# Patient Record
Sex: Male | Born: 1945
Health system: Southern US, Community
[De-identification: ages and names within clinical notes are randomized; demographics above are authoritative.]

## PROBLEM LIST (undated history)

## (undated) DIAGNOSIS — E782 Mixed hyperlipidemia: Secondary | ICD-10-CM

## (undated) DIAGNOSIS — I483 Typical atrial flutter: Secondary | ICD-10-CM

## (undated) DIAGNOSIS — K4090 Unilateral inguinal hernia, without obstruction or gangrene, not specified as recurrent: Secondary | ICD-10-CM

## (undated) DIAGNOSIS — J309 Allergic rhinitis, unspecified: Secondary | ICD-10-CM

## (undated) DIAGNOSIS — I499 Cardiac arrhythmia, unspecified: Secondary | ICD-10-CM

## (undated) DIAGNOSIS — K219 Gastro-esophageal reflux disease without esophagitis: Secondary | ICD-10-CM

## (undated) DIAGNOSIS — G4700 Insomnia, unspecified: Secondary | ICD-10-CM

## (undated) DIAGNOSIS — R030 Elevated blood-pressure reading, without diagnosis of hypertension: Secondary | ICD-10-CM

## (undated) DIAGNOSIS — I10 Essential (primary) hypertension: Secondary | ICD-10-CM

## (undated) DIAGNOSIS — I493 Ventricular premature depolarization: Secondary | ICD-10-CM

## (undated) DIAGNOSIS — R0789 Other chest pain: Secondary | ICD-10-CM

## (undated) DIAGNOSIS — E7800 Pure hypercholesterolemia, unspecified: Secondary | ICD-10-CM

## (undated) HISTORY — DX: Elevated blood-pressure reading, without diagnosis of hypertension: R03.0

## (undated) HISTORY — DX: Allergic rhinitis, unspecified: J30.9

## (undated) HISTORY — DX: Mixed hyperlipidemia: E78.2

## (undated) HISTORY — DX: Insomnia, unspecified: G47.00

## (undated) HISTORY — DX: Other chest pain: R07.89

## (undated) HISTORY — DX: Ventricular premature depolarization: I49.3

---
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
## 1992-01-23 HISTORY — PX: INGUINAL HERNIA REPAIR: SUR1180

## 1992-01-23 HISTORY — PX: ASD REPAIR: SHX258

## 1999-07-17 ENCOUNTER — Encounter: Payer: Self-pay | Admitting: General Surgery

## 1999-07-19 ENCOUNTER — Ambulatory Visit (HOSPITAL_COMMUNITY): Admission: RE | Admit: 1999-07-19 | Discharge: 1999-07-19 | Payer: Self-pay | Admitting: General Surgery

## 2002-08-10 ENCOUNTER — Encounter: Payer: Self-pay | Admitting: Emergency Medicine

## 2002-08-10 ENCOUNTER — Emergency Department (HOSPITAL_COMMUNITY): Admission: EM | Admit: 2002-08-10 | Discharge: 2002-08-10 | Payer: Self-pay | Admitting: Emergency Medicine

## 2004-02-18 ENCOUNTER — Ambulatory Visit (HOSPITAL_COMMUNITY): Admission: RE | Admit: 2004-02-18 | Discharge: 2004-02-18 | Payer: Self-pay | Admitting: Gastroenterology

## 2014-06-05 DIAGNOSIS — J439 Emphysema, unspecified: Secondary | ICD-10-CM | POA: Diagnosis not present

## 2014-06-05 DIAGNOSIS — R05 Cough: Secondary | ICD-10-CM | POA: Diagnosis not present

## 2014-07-27 DIAGNOSIS — Z1211 Encounter for screening for malignant neoplasm of colon: Secondary | ICD-10-CM | POA: Diagnosis not present

## 2014-08-10 DIAGNOSIS — E559 Vitamin D deficiency, unspecified: Secondary | ICD-10-CM | POA: Diagnosis not present

## 2014-08-10 DIAGNOSIS — R351 Nocturia: Secondary | ICD-10-CM | POA: Diagnosis not present

## 2014-08-10 DIAGNOSIS — N529 Male erectile dysfunction, unspecified: Secondary | ICD-10-CM | POA: Diagnosis not present

## 2014-08-10 DIAGNOSIS — R5383 Other fatigue: Secondary | ICD-10-CM | POA: Diagnosis not present

## 2014-08-10 DIAGNOSIS — G4700 Insomnia, unspecified: Secondary | ICD-10-CM | POA: Diagnosis not present

## 2014-09-21 DIAGNOSIS — G4700 Insomnia, unspecified: Secondary | ICD-10-CM | POA: Diagnosis not present

## 2014-09-21 DIAGNOSIS — E780 Pure hypercholesterolemia: Secondary | ICD-10-CM | POA: Diagnosis not present

## 2014-09-21 DIAGNOSIS — Z1389 Encounter for screening for other disorder: Secondary | ICD-10-CM | POA: Diagnosis not present

## 2014-09-21 DIAGNOSIS — R7309 Other abnormal glucose: Secondary | ICD-10-CM | POA: Diagnosis not present

## 2014-09-21 DIAGNOSIS — E291 Testicular hypofunction: Secondary | ICD-10-CM | POA: Diagnosis not present

## 2014-09-21 DIAGNOSIS — Z0000 Encounter for general adult medical examination without abnormal findings: Secondary | ICD-10-CM | POA: Diagnosis not present

## 2014-09-21 DIAGNOSIS — Z1212 Encounter for screening for malignant neoplasm of rectum: Secondary | ICD-10-CM | POA: Diagnosis not present

## 2014-09-29 DIAGNOSIS — Z1211 Encounter for screening for malignant neoplasm of colon: Secondary | ICD-10-CM | POA: Diagnosis not present

## 2014-09-29 DIAGNOSIS — K5730 Diverticulosis of large intestine without perforation or abscess without bleeding: Secondary | ICD-10-CM | POA: Diagnosis not present

## 2014-12-09 DIAGNOSIS — G4700 Insomnia, unspecified: Secondary | ICD-10-CM | POA: Diagnosis not present

## 2014-12-09 DIAGNOSIS — G2581 Restless legs syndrome: Secondary | ICD-10-CM | POA: Diagnosis not present

## 2014-12-09 DIAGNOSIS — R0982 Postnasal drip: Secondary | ICD-10-CM | POA: Diagnosis not present

## 2014-12-30 ENCOUNTER — Encounter: Payer: Self-pay | Admitting: Neurology

## 2014-12-30 ENCOUNTER — Ambulatory Visit (INDEPENDENT_AMBULATORY_CARE_PROVIDER_SITE_OTHER): Payer: Medicare Other | Admitting: Neurology

## 2014-12-30 VITALS — BP 130/88 | HR 82 | Resp 20 | Ht 70.5 in | Wt 171.0 lb

## 2014-12-30 DIAGNOSIS — R292 Abnormal reflex: Secondary | ICD-10-CM

## 2014-12-30 DIAGNOSIS — D508 Other iron deficiency anemias: Secondary | ICD-10-CM

## 2014-12-30 DIAGNOSIS — G4701 Insomnia due to medical condition: Secondary | ICD-10-CM | POA: Diagnosis not present

## 2014-12-30 DIAGNOSIS — G2581 Restless legs syndrome: Secondary | ICD-10-CM | POA: Diagnosis not present

## 2014-12-30 NOTE — Progress Notes (Signed)
SLEEP MEDICINE CLINIC   Provider:  Melvyn Novas, M D  Referring Provider: Dorothyann Peng, MD Primary Care Physician:  [name], MD  Chief Complaint  Patient presents with  . New Patient (Initial Visit)    legs feel the urge to move, doesn't sleep well at night, rm 11, alone    HPI: Carlos [name] is a 69 y.o. 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  He is taking Ambien CR for a while now he breaks the pill in half usually it makes him sleep but does not seem to have a hangover effect or daytime drowsiness effect. He attributes some of his difficulties to restless leg syndrome. He has never been evaluated in a sleep study if there could be other physiologic reasons for him to not have sound and restorative sleep without medication. He reports that his insomnia problem started about 15 years ago, and he has been taking the Ambien for 7 years. He does not recall if his insomnia was related to a medical issue that seemed to have him be no special stressors at the time.  He has undergone a ASD repair in 1994 and had a hernia repair in 1962. These surgeries did not contribute to insomnia. The supposed restless legs are actually evident as the meet here the patient has trouble to sit still. He has some discomfort sometimes in the back of his calf and he states that he keeps moving, mashing and massaging - described as an irresistible urge to move.   I was able to have Carlos [name] answer  the [facility] restless leg question here  which is related to the quality of life  . He states that he does not feel any impairment in daytime and that the severity is mild. He endorsed the Epworth sleepiness score at one point and the fatigue severity score at 12 points, the geriatric depression score at one point.   Sleep habits are as follows:  The  patient usually watches TV and then gets ready for bed at about 10:30. He takes the Ambien as he prepares in the bathroom and usually will be asleep within the next 30 minutes. He sleeps about 5 hours after the Ambien is working and wakes up between 4:30 and 5 AM. Craves to sleep longer but for some reason at that time is awake. He feels almost restored and refreshed enough to go for the day. He just stays in bed but doesn't leave yet. He will rise at about 6:30 so over an hour after he actually spontaneously woke. He does not have bathroom breaks interrupting his sleep. Carlos [name] is widowed but his late wife never told him that he snored or had apnea. He wakes up without any headaches and normally does not have a dry mouth. He does recall dreaming, but he never had any evidence of sleep eating sleepwalking or an enactment of dreams. In the afternoon at around 2:58 PM he will usually try to rest for an hour but he rarely will fall asleep. This is more for relaxation time.   He is vegetarian, but eats fish. Lots of greens.  Sleep medical history and family sleep history: mother had leg cramps.    Social history: ETOH, 2-3 glasses of red wine.  Nicotine: none ,  caffeine - chai  tea in AM and sometimes in PM.   Review of Systems: Out of a complete 14 system review, the patient complains of only the following symptoms, and all other reviewed systems are negative. The patient thinks that his restless legs arose over the last 7 or 8 years pretty much the same time he has tried to go to sleep with Ambien. His insomnia has been chronic and precedes the intake of Ambien present for at least 15 years.   Social History   Social History  . Marital Status: Divorced    Spouse Name: N/A  . Number of Children: N/A  . Years of Education: N/A   Occupational History  . Not on file.   Social History Main Topics  . Smoking status: Former Smoker -- 9 years    Quit date: 01/22/2006  . Smokeless tobacco: Not  on file  . Alcohol Use: 12.6 oz/week    21 Standard drinks or equivalent per week     Comment: 2-3 glasses per day  . Drug Use: No  . Sexual Activity: Not on file   Other Topics Concern  . Not on file   Social History Narrative   Drinks 1-2 cups of caffeine daily.    History reviewed. No pertinent family history.  Past Medical History  Diagnosis Date  . Mixed hyperlipidemia   . Insomnia   . Allergic rhinitis     Past Surgical History  Procedure Laterality Date  . Inguinal hernia repair  1994  . Asd repair  1994    Current Outpatient Prescriptions  Medication Sig Dispense Refill  . aspirin 81 MG tablet Take 81 mg by mouth daily.    Marland Kitchen CALCIUM PO Take by mouth.    . Cholecalciferol (VITAMIN D PO) Take by mouth.    . Multiple Vitamin (MULTIVITAMIN) capsule Take 1 capsule by mouth daily.    . Omega-3 Fatty Acids (FISH OIL PO) Take by mouth.    . zolpidem (AMBIEN CR) 12.5 MG CR tablet Take 12.5 mg by mouth at bedtime as needed for sleep.     No current facility-administered medications for this visit.    Allergies as of 12/30/2014  . (No Known Allergies)    Vitals: BP 130/88 mmHg  Pulse 82  Resp 20  Ht 5' 10.5" (1.791 m)  Wt 171 lb (77.565 kg)  BMI 24.18 kg/m2 Last Weight:  Wt Readings from Last 1 Encounters:  12/30/14 171 lb (77.565 kg)   ZOX:WRUE mass index is 24.18 kg/(m^2).     Last Height:   Ht Readings from Last 1 Encounters:  12/30/14 5' 10.5" (1.791 m)    Physical exam:  General: The patient is awake, alert and appears not in acute distress. The patient is well groomed. Head: Normocephalic, atraumatic. Neck is supple. Mallampati 1,  neck circumference: 16.5 . Nasal airflow  Unrestricted , TMJ click  evident . Retrognathia  is seen.  Cardiovascular:  Regular rate and rhythm, without  murmurs or carotid bruit, and without distended neck veins. Respiratory: Lungs are clear to auscultation. Skin:  Without evidence of edema, or rash Trunk: The  patient's posture is erect  Neurologic exam : The patient is awake and alert, oriented to place and time.   Memory subjective  described as intact.  Attention span & concentration ability appears normal.  Speech is fluent,  without  dysarthria, dysphonia or aphasia.  Mood and affect are appropriate.  Cranial nerves: Pupils are equal and briskly reactive to light. Funduscopic exam without  evidence of pallor or edema.  Extraocular movements  in vertical and horizontal planes intact and without nystagmus. Visual fields by finger perimetry are intact. Hearing to finger rub intact.   Facial sensation intact to fine touch.  Facial motor strength is symmetric and tongue and uvula move midline. Shoulder shrug was symmetrical.   Motor exam: Normal tone, muscle bulk and symmetric strength in all extremities.  Sensory:  Fine touch, pinprick and vibration were tested in all extremities. Proprioception tested in the upper extremities was normal.  Coordination: Rapid alternating movements in the fingers/hands was normal.  Finger-to-nose maneuver  normal without evidence of ataxia, dysmetria or tremor.  Gait and station: Patient walks without assistive device and is able unassisted to climb up to the exam table. Strength within normal limits.  Stance is stable and normal. Turns with  3 Steps. Romberg testing is  negative.  Deep tendon reflexes: in the  upper and lower extremities are present, the lower extremity reflexes are very brisk but there is no clonus. Especially the patella reflex is brisk symmetric and intact. Babinski maneuver response is downgoing.  The patient was advised of the nature of the diagnosed sleep disorder , the treatment options and risks for general a health and wellness arising from not treating the condition.  I spent more than 40 minutes of face to face time with the patient. Greater than 50% of time was spent in counseling and coordination of care. We have discussed the  diagnosis and differential and I answered the patient's questions.     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 Due to the finding of very brisk lower extremity deep tendon reflexes I suspect that he may have a spinal stenosis, his upper extremity reflexes are at a much lower level. He does not have any muscle atrophy or asymmetry of tone.  2) in addition to an imaging study of the lower spine I would like to obtain a ferritin level, total iron binding capacity, free iron level. This is to rule out an iron deficiency promoted restless leg syndrome.  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   Plan:  Treatment plan and additional workup :  Lab tests and MRI thoracic/ lower spine .      Carlos Mylararmen Sekai Nayak MD  12/30/2014   CC: Dorothyann Pengobyn Sanders, Md [address] [place], [place] [id number]

## 2014-12-30 NOTE — Addendum Note (Signed)
Addended by: Melvyn NovasHMEIER, Carley Strickling on: 12/30/2014 02:59 PM   Modules accepted: Orders

## 2014-12-31 LAB — COMPREHENSIVE METABOLIC PANEL
ALK PHOS: 86 IU/L (ref 39–117)
ALT: 24 IU/L (ref 0–44)
AST: 13 IU/L (ref 0–40)
Albumin/Globulin Ratio: 1.8 (ref 1.1–2.5)
Albumin: 4.6 g/dL (ref 3.6–4.8)
BILIRUBIN TOTAL: 0.6 mg/dL (ref 0.0–1.2)
BUN/Creatinine Ratio: 17 (ref 10–22)
BUN: 15 mg/dL (ref 8–27)
CHLORIDE: 96 mmol/L — AB (ref 97–106)
CO2: 26 mmol/L (ref 18–29)
Calcium: 10.6 mg/dL — ABNORMAL HIGH (ref 8.6–10.2)
Creatinine, Ser: 0.88 mg/dL (ref 0.76–1.27)
GFR calc non Af Amer: 88 mL/min/{1.73_m2} (ref >59)
GFR, EST AFRICAN AMERICAN: 101 mL/min/{1.73_m2} (ref >59)
GLUCOSE: 93 mg/dL (ref 65–99)
Globulin, Total: 2.6 g/dL (ref 1.5–4.5)
Potassium: 5.2 mmol/L (ref 3.5–5.2)
Sodium: 137 mmol/L (ref 136–144)
TOTAL PROTEIN: 7.2 g/dL (ref 6.0–8.5)

## 2014-12-31 LAB — FERRITIN: FERRITIN: 425 ng/mL — AB (ref 30–400)

## 2015-01-03 ENCOUNTER — Telehealth: Payer: Self-pay

## 2015-01-03 NOTE — Telephone Encounter (Signed)
Spoke to pt and advised him that his ferritin is high, which could be a sign of an inflammatory process. I advised him that his calcium was slightly elevated which may be related to calcium supplement intake. I advised him that his renal function and liver function was normal. Pt verbalized understanding. Pt says he will discuss these results further with Dr. Vickey Hugerohmeier on 02/09/15.

## 2015-01-03 NOTE — Telephone Encounter (Signed)
Called pt to discuss results. No answer, left a message asking him to call me back.

## 2015-01-03 NOTE — Telephone Encounter (Signed)
-----   Message from Melvyn Novasarmen Dohmeier, MD sent at 12/31/2014  1:25 PM EST ----- Dr. Clelia CroftShaw has an unusual high ferritin level, which can be a sign of an inflammatory process. His calcium was slightly elevated which may be related to calcium supplement intake? Renal function and liver function and normal limits. CD

## 2015-01-03 NOTE — Telephone Encounter (Signed)
Pt returned call

## 2015-01-12 ENCOUNTER — Ambulatory Visit
Admission: RE | Admit: 2015-01-12 | Discharge: 2015-01-12 | Disposition: A | Discharge status: Home / Self Care | Payer: Medicare Other | Source: Ambulatory Visit | Attending: Neurology | Admitting: Neurology

## 2015-01-12 DIAGNOSIS — G2581 Restless legs syndrome: Secondary | ICD-10-CM

## 2015-01-12 DIAGNOSIS — R292 Abnormal reflex: Secondary | ICD-10-CM

## 2015-01-12 DIAGNOSIS — G4701 Insomnia due to medical condition: Secondary | ICD-10-CM | POA: Diagnosis not present

## 2015-01-12 MED ORDER — GADOBENATE DIMEGLUMINE 529 MG/ML IV SOLN
15.0000 mL | Freq: Once | INTRAVENOUS | Status: AC | PRN
  Administered 2015-01-12: 15 mL via INTRAVENOUS

## 2015-01-19 ENCOUNTER — Telehealth: Payer: Self-pay

## 2015-01-19 NOTE — Telephone Encounter (Signed)
Spoke pt pt and advised him that his MRI results showed a degeneration of his spine as well as a beginning dilation of his thoracic aorta. The degeneration of his spinal column could be an explanation for his hyperreflexia and RLS but does not show any nerve fiber impingement. Pt wants to discuss these results at his appt on 1/18. Pt verbalized understanding.

## 2015-01-19 NOTE — Telephone Encounter (Signed)
I advised pt that his MRI results showed a borderline enlarged thoracic aorta that should be followed every 2 years, and mild disc bulging. I advised him that nerve studies should be discussed at his next OV. Pt verbalized understanding.

## 2015-01-19 NOTE — Telephone Encounter (Signed)
-----   Message from Melvyn Novasarmen Dohmeier, MD sent at 01/18/2015  4:48 PM EST ----- Borderline enlarged thoracic aorta that should be followed up every 2 years. Mild disc bulging.  I would like EMG and nerve conduction studies to follow.Can be discussed in Rv with NP.

## 2015-01-19 NOTE — Telephone Encounter (Signed)
-----   Message from Melvyn Novasarmen Dohmeier, MD sent at 01/18/2015  4:46 PM EST ----- Dr.penumalli reported a degeneration of the spine as well as a beginning dilation of the aorta abdominalis. The degeneration of the spinal column could be an explanation for his hyperreflexia and restless legs but it does not report that nerve fibers are impinged upon.

## 2015-02-09 ENCOUNTER — Ambulatory Visit (INDEPENDENT_AMBULATORY_CARE_PROVIDER_SITE_OTHER): Payer: Medicare Other | Admitting: Neurology

## 2015-02-09 ENCOUNTER — Encounter: Payer: Self-pay | Admitting: Neurology

## 2015-02-09 VITALS — BP 130/90 | HR 66 | Resp 20 | Ht 70.5 in | Wt 165.0 lb

## 2015-02-09 DIAGNOSIS — G2581 Restless legs syndrome: Secondary | ICD-10-CM | POA: Diagnosis not present

## 2015-02-09 DIAGNOSIS — G4761 Periodic limb movement disorder: Secondary | ICD-10-CM | POA: Diagnosis not present

## 2015-02-09 DIAGNOSIS — R292 Abnormal reflex: Secondary | ICD-10-CM | POA: Diagnosis not present

## 2015-02-09 MED ORDER — ZOLPIDEM TARTRATE ER 12.5 MG PO TBCR
12.5000 mg | EXTENDED_RELEASE_TABLET | Freq: Every evening | ORAL | Status: DC | PRN
Start: 2015-02-09 — End: 2015-08-17

## 2015-02-09 NOTE — Progress Notes (Signed)
SLEEP MEDICINE CLINIC   Provider:  Melvyn Novas, M D  Referring Provider: Dorothyann Peng, MD Primary Care Physician:  [name], MD  Chief Complaint  Patient presents with  . Follow-up    MRI results, insomnia, rm 11, alone    HPI: Dr.  Orland Jarred is a 70 y.o. male , seen here as a referral from Dr. 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  He is taking Ambien CR for a while now he breaks the pill in half usually it makes him sleep but does not seem to have a hangover effect or daytime drowsiness effect. He attributes some of his difficulties to restless leg syndrome. He has never been evaluated in a sleep study if there could be other physiologic reasons for him to not have sound and restorative sleep without medication. He reports that his insomnia problem started about 15 years ago, and he has been taking the Ambien for 7 years. He does not recall if his insomnia was related to a medical issue that seemed to have him be no special stressors at the time.  He has undergone a ASD repair in 1994 and had a hernia repair in 1962. These surgeries did not contribute to insomnia. The supposed restless legs are actually evident as the meet here the patient has trouble to sit still. He has some discomfort sometimes in the back of his calf and he states that he keeps moving, mashing and massaging - described as an irresistible urge to move.  I was able to have Dr. Sherryll Burger answer  the [facility] restless leg question here  which is related to the quality of life . He states that he does not feel any impairment in daytime and that the severity is mild. He endorsed the Epworth sleepiness score at one point and the fatigue severity score at 12 points, the geriatric depression score at one point.  Sleep habits are as follows:The patient usually watches TV and then gets ready for bed at about  10:30. He takes the Ambien as he prepares in the bathroom and usually will be asleep within the next 30 minutes. He sleeps about 5 hours after the Ambien is working and wakes up between 4:30 and 5 AM. Craves to sleep longer but for some reason at that time is awake. He feels almost restored and refreshed enough to go for the day. He just stays in bed but doesn't leave yet. He will rise at about 6:30 so over an hour after he actually spontaneously woke. He does not have bathroom breaks interrupting his sleep. Dr. Clelia Croft is widowed but his late wife never told him that he snored or had apnea. He wakes up without any headaches and normally does not have a dry mouth. He does recall dreaming, but he never had any evidence of sleep eating sleepwalking or an enactment of dreams. In the afternoon at around 2:58 PM he will usually try to rest for an hour but he rarely will fall asleep. This is more for relaxation time. He is vegetarian, but eats fish. Lots of greens.  Sleep medical history and family sleep history: mother had leg cramps.  Social history: ETOH, 2-3 glasses of red wine.  Nicotine: none ,  caffeine - chai tea in AM and sometimes in PM.   Interval history from 02/09/2015,  Mr. Littler is here today for a  revisit. I had noted in his physical exam that he has rather brisk deep tendon reflexes and for this reason evaluated him with a imaging study. His thoracic spine MRI showed mild disc bulging and spondylosis which would be allowed for his age. There is no spinal stenosis and no foraminal narrowing noted. Enhancing lesions within the spinal cord but incidentally of borderline and large thoracic aorta was found be consider for his height and normal thoracic aorta lumen to be 3 cm and his of 3.6. My colleage ,Dr. Marjory Lies , recommended monitoring at interval of 2 years- I will send this note to Dr Allyne Gee.  We also obtained a metabolic panel showed a low very borderline chloride level and an elevated calcium  level.  The calcium level is is explained by vitamin D intake.  He did have a high ferritin level which is usually a marker for inflammation. A low ferritin level would be a marker for iron deficiency and could explain restless legs. Again in his case is did not show up.  Review of Systems: Out of a complete 14 system review, the patient complains of only the following symptoms, and all other reviewed systems are negative. The patient thinks that his restless legs arose over the last 7 or 8 years pretty much the same time he has tried to go to sleep with Ambien. His insomnia has been chronic and precedes the intake of Ambien present for at least 15 years.   Social History   Social History  . Marital Status: Divorced    Spouse Name: N/A  . Number of Children: N/A  . Years of Education: N/A   Occupational History  . Not on file.   Social History Main Topics  . Smoking status: Former Smoker -- 9 years    Quit date: 01/22/2006  . Smokeless tobacco: Not on file  . Alcohol Use: 12.6 oz/week    21 Standard drinks or equivalent per week     Comment: 2-3 glasses per day  . Drug Use: No  . Sexual Activity: Not on file   Other Topics Concern  . Not on file   Social History Narrative   Drinks 1-2 cups of caffeine daily.    History reviewed. No pertinent family history.  Past Medical History  Diagnosis Date  . Mixed hyperlipidemia   . Insomnia   . Allergic rhinitis     Past Surgical History  Procedure Laterality Date  . Inguinal hernia repair  1994  . Asd repair  1994    Current Outpatient Prescriptions  Medication Sig Dispense Refill  . aspirin 81 MG tablet Take 81 mg by mouth daily.    Marland Kitchen CALCIUM PO Take by mouth.    . Cholecalciferol (VITAMIN D PO) Take by mouth.    . Multiple Vitamin (MULTIVITAMIN) capsule Take 1 capsule by mouth daily.    . Omega-3 Fatty Acids (FISH OIL PO) Take by mouth.    . zolpidem (AMBIEN CR) 12.5 MG CR tablet Take 12.5 mg by mouth at bedtime  as needed for sleep.     No current facility-administered medications for this visit.    Allergies as of 02/09/2015  . (No Known Allergies)    Vitals: BP 130/90 mmHg  Pulse 66  Resp 20  Ht 5' 10.5" (1.791 m)  Wt 165 lb (74.844 kg)  BMI 23.33 kg/m2 Last Weight:  Wt Readings from Last 1 Encounters:  02/09/15 165 lb (74.844 kg)   VWU:JWJX mass index is 23.33 kg/(m^2).  Last Height:   Ht Readings from Last 1 Encounters:  02/09/15 5' 10.5" (1.791 m)    Physical exam:  General: The patient is awake, alert and appears not in acute distress. The patient is well groomed. Head: Normocephalic, atraumatic. Neck is supple. Mallampati 1,  neck circumference: 16.5 . Nasal airflow  Unrestricted , TMJ click  evident . Retrognathia  is seen.  Cardiovascular:  Regular rate and rhythm, without  murmurs or carotid bruit, and without distended neck veins. Respiratory: Lungs are clear to auscultation. Skin:  Without evidence of edema, or rash Trunk: The patient's posture is erect  Neurologic exam : The patient is awake and alert, oriented to place and time.   Memory subjective  described as intact.  Attention span & concentration ability appears normal.  Speech is fluent,  without  dysarthria, dysphonia or aphasia.  Mood and affect are appropriate.  Cranial nerves: Pupils are equal and briskly reactive to light. Funduscopic exam without  evidence of pallor or edema.  Extraocular movements  in vertical and horizontal planes intact and without nystagmus. Visual fields by finger perimetry are intact. Hearing to finger rub intact.   Facial sensation intact to fine touch.  Facial motor strength is symmetric and tongue and uvula move midline. Shoulder shrug was symmetrical.   Motor exam: Normal tone, muscle bulk and symmetric strength in all extremities.  Deep tendon reflexes: in the  upper and lower extremities are present, the lower extremity reflexes are very brisk but there is no clonus.  Especially the patella reflex is brisk symmetric and intact. Babinski maneuver response is downgoing.  The patient was advised of the nature of the diagnosed sleep disorder , the treatment options and risks for general a health and wellness arising from not treating the condition.  I spent more than 20 minutes of face to face time with the patient. Greater than 50% of time was spent in counseling and coordination of care.   We have discussed the diagnosis and differential and I answered the patient's questions. I will offer him Ambien refill.    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  Due to the finding of very brisk lower extremity deep tendon reflexes I suspect that he may have a spinal stenosis, his upper extremity reflexes are at a much lower level. He does not have any muscle atrophy or asymmetry of tone.  Due to the benign findings of his imaging study and the comprehensive metabolic panel and ferritin level I will refill the patient's Ambien,   I will also ask his primary care physician Dr. Lelon Perla to consider checking on the enlarged thoracic aorta every 2 years.   Plan:  Treatment plan and additional workup :   Melvyn Novas MD  02/09/2015   CC: Dorothyann Peng, Md [address] [place] [address] [place], [place] [address]

## 2015-02-09 NOTE — Patient Instructions (Signed)
Restless Legs Syndrome Restless legs syndrome is a condition that causes uncomfortable feelings or sensations in the legs, especially while sitting or lying down. The sensations usually cause an overwhelming urge to move the legs. The arms can also sometimes be affected. The condition can range from mild to severe. The symptoms often interfere with a person's ability to sleep. CAUSES The cause of this condition is not known. RISK FACTORS This condition is more likely to develop in:  People who are older than age 50.  Pregnant women. In general, restless legs syndrome is more common in women than in men.  People who have a family history of the condition.  People who have certain medical conditions, such as iron deficiency, kidney disease, Parkinson disease, or nerve damage.  People who take certain medicines, such as medicines for high blood pressure, nausea, colds, allergies, depression, and some heart conditions. SYMPTOMS The main symptom of this condition is uncomfortable sensations in the legs. These sensations may be:  Described as pulling, tingling, prickling, throbbing, crawling, or burning.  Worse while you are sitting or lying down.  Worse during periods of rest or inactivity.  Worse at night, often interfering with your sleep.  Accompanied by a very strong urge to move your legs.  Temporarily relieved by movement of your legs. The sensations usually affect both sides of the body. The arms can also be affected, but this is rare. People who have this condition often have tiredness during the day because of their lack of sleep at night. DIAGNOSIS This condition may be diagnosed based on your description of the symptoms. You may also have tests, including blood tests, to check for other conditions that may lead to your symptoms. In some cases, you may be asked to spend some time in a sleep lab so your sleeping can be monitored. TREATMENT Treatment for this condition is  focused on managing the symptoms. Treatment may include:  Self-help and lifestyle changes.  Medicines. HOME CARE INSTRUCTIONS  Take medicines only as directed by your health care provider.  Try these methods to get temporary relief from the uncomfortable sensations:  Massage your legs.  Walk or stretch.  Take a cold or hot bath.  Practice good sleep habits. For example, go to bed and get up at the same time every day.  Exercise regularly.  Practice ways of relaxing, such as yoga or meditation.  Avoid caffeine and alcohol.  Do not use any tobacco products, including cigarettes, chewing tobacco, or electronic cigarettes. If you need help quitting, ask your health care provider.  Keep all follow-up visits as directed by your health care provider. This is important. SEEK MEDICAL CARE IF: Your symptoms do not improve with treatment, or they get worse.   This information is not intended to replace advice given to you by your health care provider. Make sure you discuss any questions you have with your health care provider.   Document Released: 12/29/2001 Document Revised: 05/25/2014 Document Reviewed: 01/04/2014 Elsevier Interactive Patient Education 2016 Elsevier Inc.  

## 2015-02-21 ENCOUNTER — Telehealth: Payer: Self-pay

## 2015-02-21 NOTE — Telephone Encounter (Signed)
Pt came by office requesting a copy of his MRI thoracic results. Pt signed a release of medical records allowing him to pick up a copy of his MRI thoracic. Imaging report printed. I advised pt that if he wanted a CD with the images, he must go to [facility] and request it. Pt verbalized understanding.

## 2015-02-22 ENCOUNTER — Telehealth: Payer: Self-pay

## 2015-02-22 NOTE — Telephone Encounter (Signed)
I will obtain the guidelines for t abdominal and thoracic aortic enlargement before calling him. CD

## 2015-02-22 NOTE — Telephone Encounter (Signed)
Pt came by the office again today. His nephew is a physician in [place] and is concerned about the incidental finding of the enlarged thoracic aorta from the MRI thoracic spine. Pt is asking about dimensions of the enlargement and exact location. He received a CD of the images from [facility], but was told by that office that Dr. Marjory Lies read the report and he would be the one to know th exact dimensions and locations of the enlargement.  Pt already has a copy of the MRI report and CD of the images, but he is requesting that Dr. Vickey Huger give him a call to discuss his  concerns. I advised pt that I would send this message to her and she would call him at her convenience. Pt's phone number is [phone].

## 2015-02-23 NOTE — Telephone Encounter (Signed)
I left a voice message today from [name], was very concerned about his thoracic aortic dilation to 3.6 cm. Surgical options are usually not entertained under 5 cm. He is definitely not in danger of needing a surgical evaluation but vascular surgery guidelines recommend an ultrasound yearly to measure the aortic lumen and initiate surgery if necessary once the name threshold has passed.Recommendationis an US of the abdomen in the next 6 month and from there on in regular intervals to monitor.     I have forwarded this note to Dr Allyne Gee.   Melvyn Novas, MD

## 2015-02-23 NOTE — Telephone Encounter (Signed)
Patient is calling back to discuss MRI results.

## 2015-02-24 ENCOUNTER — Telehealth: Payer: Self-pay | Admitting: *Deleted

## 2015-02-24 NOTE — Telephone Encounter (Signed)
I left a long and detailed message on voicemail for Carlos [name], he will have to discuss the results further with his primary care physician or takes a disc to his nephew, I understand will be reviewing the study with him.CD

## 2015-02-24 NOTE — Telephone Encounter (Signed)
Called pt to discuss. Dr. Vickey Huger left him a long and detailed message yesterday and she recommends discussing the results further with his PCP, and to let his nephew look at the CD if he is further concerned. No answer, left a message asking him to call us back.

## 2015-02-24 NOTE — Telephone Encounter (Signed)
Pt called back and states the message left was hard for him to understand. He is requesting another call back . He feels that it needs to be in " common man's language not medical."

## 2015-02-24 NOTE — Telephone Encounter (Signed)
I spoke to Carlos [name]. I explained to him that he needed to discuss this further with Dr. Allyne Gee, his PCP. The enlarged aorta was an incidental finding on his MRI, and since it is not neurological, our office would recommend perhaps a cardiology evaluation, but that is up to the Carlos [name] and Dr. Allyne Gee to decide. I explained to him that since the radiologist was taking pictures of his thoracic spine, and the aorta was not a focus of their pictures, it would be hard to pinpoint the exact location of the enlargement. Further testing may be needed to determine that. Carlos [name] has picked up the images from [facility]. I encouraged him to show the images to his PCP and his nephew physician if he has further questions or concerns. Carlos [name] verbalized understanding. Carlos [name] thanked me for my time and effort.

## 2015-02-24 NOTE — Telephone Encounter (Signed)
Pt came by the office for the third time this week. He said that he still hasn't received a call from Dr. Vickey Huger to discuss MRI results.

## 2015-03-02 ENCOUNTER — Telehealth: Payer: Self-pay | Admitting: Diagnostic Neuroimaging

## 2015-03-02 DIAGNOSIS — J309 Allergic rhinitis, unspecified: Secondary | ICD-10-CM | POA: Diagnosis not present

## 2015-03-02 DIAGNOSIS — Z79899 Other long term (current) drug therapy: Secondary | ICD-10-CM | POA: Diagnosis not present

## 2015-03-02 DIAGNOSIS — R7309 Other abnormal glucose: Secondary | ICD-10-CM | POA: Diagnosis not present

## 2015-03-02 DIAGNOSIS — G4700 Insomnia, unspecified: Secondary | ICD-10-CM | POA: Diagnosis not present

## 2015-03-02 NOTE — Telephone Encounter (Signed)
Dear Carlos [name], Thank you for this clarification.

## 2015-03-02 NOTE — Telephone Encounter (Signed)
Dr Levie Heritage  [phone] would like Dr Marjory Lies to return his call

## 2015-03-02 NOTE — Telephone Encounter (Signed)
I spoke to Dr. Rosita Fire. 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 Advise follow up with PCP or cardiology, as patient has history of sternotomy and ASD repair in the past.   Suanne Marker, MD 03/02/2015, 3:10 PM Certified in Neurology, Neurophysiology and Neuroimaging  [facility] [address] [place], [place] [address] [phone]

## 2015-03-08 DIAGNOSIS — I77819 Aortic ectasia, unspecified site: Secondary | ICD-10-CM | POA: Diagnosis not present

## 2015-03-08 DIAGNOSIS — I77811 Abdominal aortic ectasia: Secondary | ICD-10-CM | POA: Diagnosis not present

## 2015-03-12 ENCOUNTER — Emergency Department (HOSPITAL_COMMUNITY)
Admission: EM | Admit: 2015-03-12 | Discharge: 2015-03-12 | Disposition: A | Discharge status: Home / Self Care | Payer: No Typology Code available for payment source | Attending: Emergency Medicine | Admitting: Emergency Medicine

## 2015-03-12 ENCOUNTER — Emergency Department (HOSPITAL_COMMUNITY): Payer: No Typology Code available for payment source

## 2015-03-12 ENCOUNTER — Encounter (HOSPITAL_COMMUNITY): Payer: Self-pay | Admitting: Emergency Medicine

## 2015-03-12 DIAGNOSIS — M549 Dorsalgia, unspecified: Secondary | ICD-10-CM | POA: Diagnosis not present

## 2015-03-12 DIAGNOSIS — Y9389 Activity, other specified: Secondary | ICD-10-CM | POA: Insufficient documentation

## 2015-03-12 DIAGNOSIS — S39012A Strain of muscle, fascia and tendon of lower back, initial encounter: Secondary | ICD-10-CM | POA: Insufficient documentation

## 2015-03-12 DIAGNOSIS — Y92410 Unspecified street and highway as the place of occurrence of the external cause: Secondary | ICD-10-CM | POA: Insufficient documentation

## 2015-03-12 DIAGNOSIS — S29001A Unspecified injury of muscle and tendon of front wall of thorax, initial encounter: Secondary | ICD-10-CM | POA: Insufficient documentation

## 2015-03-12 DIAGNOSIS — Y998 Other external cause status: Secondary | ICD-10-CM | POA: Diagnosis not present

## 2015-03-12 DIAGNOSIS — S161XXA Strain of muscle, fascia and tendon at neck level, initial encounter: Secondary | ICD-10-CM | POA: Insufficient documentation

## 2015-03-12 DIAGNOSIS — G4700 Insomnia, unspecified: Secondary | ICD-10-CM | POA: Diagnosis not present

## 2015-03-12 DIAGNOSIS — M545 Low back pain: Secondary | ICD-10-CM | POA: Diagnosis not present

## 2015-03-12 DIAGNOSIS — Z79899 Other long term (current) drug therapy: Secondary | ICD-10-CM | POA: Insufficient documentation

## 2015-03-12 DIAGNOSIS — E782 Mixed hyperlipidemia: Secondary | ICD-10-CM | POA: Insufficient documentation

## 2015-03-12 DIAGNOSIS — Z87891 Personal history of nicotine dependence: Secondary | ICD-10-CM | POA: Diagnosis not present

## 2015-03-12 DIAGNOSIS — Z7982 Long term (current) use of aspirin: Secondary | ICD-10-CM | POA: Insufficient documentation

## 2015-03-12 DIAGNOSIS — S199XXA Unspecified injury of neck, initial encounter: Secondary | ICD-10-CM | POA: Diagnosis not present
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
## 2015-03-12 MED ORDER — METHOCARBAMOL 500 MG PO TABS: 500.0000 mg | ORAL_TABLET | Freq: Two times a day (BID) | ORAL | Status: DC

## 2015-03-12 MED ORDER — NAPROXEN 500 MG PO TABS: 500.0000 mg | ORAL_TABLET | Freq: Two times a day (BID) | ORAL | Status: DC
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
## 2015-03-14 DIAGNOSIS — M25511 Pain in right shoulder: Secondary | ICD-10-CM | POA: Diagnosis not present

## 2015-03-14 DIAGNOSIS — M542 Cervicalgia: Secondary | ICD-10-CM | POA: Diagnosis not present

## 2015-03-14 DIAGNOSIS — M545 Low back pain: Secondary | ICD-10-CM | POA: Diagnosis not present

## 2015-08-17 ENCOUNTER — Other Ambulatory Visit: Payer: Self-pay | Admitting: Neurology

## 2015-08-17 MED ORDER — ZOLPIDEM TARTRATE ER 12.5 MG PO TBCR: 12.5000 mg | EXTENDED_RELEASE_TABLET | Freq: Every evening | ORAL | 5 refills | Status: DC | PRN

## 2015-08-17 NOTE — Addendum Note (Signed)
Addended by: Geronimo Running A on: 08/17/2015 03:57 PM   Modules accepted: Orders

## 2015-08-17 NOTE — Telephone Encounter (Signed)
Patient requesting refill of zolpidem (AMBIEN CR) 12.5 MG CR tablet Pharmacy: [facility] [address]

## 2015-08-18 NOTE — Telephone Encounter (Signed)
RX for [employer] faxed to [facility] on [address], per pt request. Received a receipt of confirmation.

## 2015-08-25 DIAGNOSIS — G4700 Insomnia, unspecified: Secondary | ICD-10-CM | POA: Diagnosis not present

## 2015-08-25 DIAGNOSIS — R0982 Postnasal drip: Secondary | ICD-10-CM | POA: Diagnosis not present

## 2015-08-25 DIAGNOSIS — Z7982 Long term (current) use of aspirin: Secondary | ICD-10-CM | POA: Diagnosis not present

## 2015-08-25 DIAGNOSIS — R079 Chest pain, unspecified: Secondary | ICD-10-CM | POA: Diagnosis not present

## 2015-09-15 ENCOUNTER — Telehealth: Payer: Self-pay | Admitting: Cardiovascular Disease

## 2015-09-15 NOTE — Telephone Encounter (Signed)
Received records from [facility] for appointment on 09/16/15 with Dr Duke Salviaandolph.  [facility] for Dr Leonides Sakeandolph's schedule on 09/16/15. lp

## 2015-09-16 ENCOUNTER — Ambulatory Visit (INDEPENDENT_AMBULATORY_CARE_PROVIDER_SITE_OTHER): Payer: Medicare Other | Admitting: Cardiovascular Disease

## 2015-09-16 ENCOUNTER — Encounter: Payer: Self-pay | Admitting: Cardiovascular Disease

## 2015-09-16 VITALS — BP 142/80 | HR 66 | Ht 71.0 in | Wt 174.6 lb

## 2015-09-16 DIAGNOSIS — E785 Hyperlipidemia, unspecified: Secondary | ICD-10-CM

## 2015-09-16 DIAGNOSIS — R0789 Other chest pain: Secondary | ICD-10-CM | POA: Diagnosis not present

## 2015-09-16 HISTORY — DX: Other chest pain: R07.89

## 2015-09-16 NOTE — Progress Notes (Signed)
Cardiology Office Note   Date:  09/16/2015   ID:  Carlos [name], Carlos [name] 09-Sep-1945, MRN [id number]  PCP:  Carlos [name] Aliment, MD  Cardiologist:   Carlos [name] Si, MD   Chief Complaint  [name] presents with  . New [name] (Initial Visit)    pressure in chest, pt states no other sx      History of Present Illness: Carlos [name] [name] is a 69 y.o. male with hyperlipidemia and mild ascending aorta (3.6 cm) aneurysm who presents for an evaluation of chest pain.  Carlos [name] [name] was seen by Carlos [name] [name] on 08/25/15.  At that appointment he reported occasional episodes of left sided chest pain.  Carlos [name] episodes occurred and typically occur at rest. He goes for a walk each evening and does not have chest pain with this. Later in Carlos [name] evening when he is laying in bed he notes a very mild chest pressure under his left breast. It does not radiate and is not associated with shortness of breath, nausea, diaphoresis, lightheadedness, or dizziness. He doesn't typically notice it through Carlos [name] daytime. He has not noted a sour taste in his mouth either when laying down or when bending over.  He felt that it was likely due to acid reflux but wanted to see cardiology to be sure.    In Carlos [name] 1990s he had similar symptoms. At Carlos [name] time he was pursuing tenure at [employer] and was having marital issues. It was ultimately attributed to GERD and improved with a PPI.  Carlos [name] [name] saw Carlos [name] [name] in 1993. At that time he reported chest pain and shortness of breath. He had an echocardiogram and was noted to have an ASD. This was repaired by Dr. Tyrone [name].  Since then he has not noted any chest pain, shortness of breath, lower extremity edema, orthopnea or PND.   Past Medical History:  Diagnosis Date  . Allergic rhinitis   . Atypical chest pain 09/16/2015  . Insomnia   . Mixed hyperlipidemia     Past Surgical History:  Procedure Laterality Date  . ASD REPAIR  1994  . INGUINAL HERNIA REPAIR  1994     Current Outpatient Prescriptions    Medication Sig Dispense Refill  . aspirin 81 MG tablet Take 81 mg by mouth daily.    Marland Kitchen CALCIUM PO Take by mouth.    . Cholecalciferol (VITAMIN D PO) Take by mouth.    . Multiple Vitamin (MULTIVITAMIN) capsule Take 1 capsule by mouth daily.    . Omega-3 Fatty Acids (FISH OIL PO) Take by mouth.     No current facility-administered medications for this visit.     Allergies:   Review of [name] allergies indicates no known allergies.    Social History:  Carlos [name] [name]  reports that he quit smoking about 9 years ago. He quit after 9.00 years of use. He does not have any smokeless tobacco history on file. He reports that he drinks about 12.6 oz of alcohol per week . He reports that he does not use drugs.   Family History:  Carlos [name] [name] family history includes Heart attack in his father; Pulmonary embolism in his brother.    ROS:  Please see Carlos [name] history of present illness.   Otherwise, review of systems are positive for none.   All other systems are reviewed and negative.    PHYSICAL EXAM: VS:  BP (!) 142/80   Pulse 66   Ht 5\' 11"  (1.803 m)   Wt 174 lb 9.6 oz (79.2  kg)   BMI 24.35 kg/m  , BMI Body mass index is 24.35 kg/m. GENERAL:  Well appearing HEENT:  Pupils equal round and reactive, fundi not visualized, oral mucosa unremarkable NECK:  No jugular venous distention, waveform within normal limits, carotid upstroke brisk and symmetric, no bruits, no thyromegaly LYMPHATICS:  No cervical adenopathy LUNGS:  Clear to auscultation bilaterally HEART:  RRR.  PMI not displaced or sustained,S1 and S2 within normal limits, no S3, no S4, no clicks, no rubs, I/VI systolic murmur at Carlos [name] LUSB ABD:  Flat, positive bowel sounds normal in frequency in pitch, no bruits, no rebound, no guarding, no midline pulsatile mass, no hepatomegaly, no splenomegaly EXT:  2 plus pulses throughout, no edema, no cyanosis no clubbing SKIN:  No rashes no nodules NEURO:  Cranial nerves II through XII grossly intact,  motor grossly intact throughout PSYCH:  Cognitively intact, oriented to person place and time    EKG:  EKG is ordered today. Carlos [name] ekg ordered today demonstrates sinus rhythm rate 66 bpm.   Recent Labs: 12/30/2014: ALT 24; BUN 15; Creatinine, Ser 0.88; Potassium 5.2; Sodium 137    Lipid Panel No results found for: CHOL, TRIG, HDL, CHOLHDL, VLDL, LDLCALC, LDLDIRECT    Wt Readings from Last 3 Encounters:  09/16/15 174 lb 9.6 oz (79.2 kg)  02/09/15 165 lb (74.8 kg)  12/30/14 171 lb (77.6 kg)      ASSESSMENT AND PLAN:  # Atypical chest pain: Carlos [name] [name] Chest pain is very atypical and seems to be more related to gastroesophageal reflux disease and ischemia. However, he does have risk factors including a strong family history of coronary artery disease and hyperlipidemia. It is been many years since he had a stress test. We will refer him for an exercise Stress test to evaluate for ischemia. If this is normal I recommended that he start a PPI.   # Hyperlipidemia: Managed by Dr. Allyne GeeSanders.  eportedly well-controlled.   # Elevated blood pressure:  Carlos [name] [name] blood pressure is mildly elevated today. He reports that it is typically well controlled when checked in other settings. He had it checked in Dr. Allyne GeeSanders office earlier this month and it was normal. We will continue to monitor for now.  Current medicines are reviewed at length with Carlos [name] [name] today.  Carlos [name] [name] does not have concerns regarding medicines.  Carlos [name] following changes have been made:  no change  Labs/ tests ordered today include:   Orders Placed This Encounter  Procedures  . Exercise Tolerance Test  . EKG 12-Lead     Disposition:   FU with Kaliana Albino C. Duke Salviaandolph, MD, [facility] in 1 month.    This note was written with Carlos [name] assistance of speech recognition software.  Please excuse any transcriptional errors.  Signed, Traci Plemons C. Duke Salviaandolph, MD, [facility]  09/16/2015 4:04 PM    [facility]

## 2015-09-16 NOTE — Patient Instructions (Signed)
Medication Instructions:  Your physician recommends that you continue on your current medications as directed. Please refer to the Current Medication list given to you today.  Labwork: none  Testing/Procedures: Your physician has requested that you have an exercise tolerance test. For further information please visit [email or web address]. Please also follow instruction sheet, as given.  Follow-Up: Your physician recommends that you schedule a follow-up appointment in: 1 month ov  If you need a refill on your cardiac medications before your next appointment, please call your pharmacy.  

## 2015-09-20 ENCOUNTER — Encounter (HOSPITAL_COMMUNITY): Payer: Medicare Other

## 2015-10-05 ENCOUNTER — Telehealth (HOSPITAL_COMMUNITY): Payer: Self-pay

## 2015-10-05 NOTE — Telephone Encounter (Signed)
Encounter complete. 

## 2015-10-07 ENCOUNTER — Ambulatory Visit (HOSPITAL_COMMUNITY)
Admission: RE | Admit: 2015-10-07 | Discharge: 2015-10-07 | Disposition: A | Discharge status: Home / Self Care | Payer: Medicare Other | Source: Ambulatory Visit | Attending: Cardiovascular Disease | Admitting: Cardiovascular Disease

## 2015-10-07 DIAGNOSIS — R0789 Other chest pain: Secondary | ICD-10-CM | POA: Insufficient documentation

## 2015-10-11 LAB — EXERCISE TOLERANCE TEST
CHL CUP MPHR: 151 {beats}/min
CHL RATE OF PERCEIVED EXERTION: 18
CSEPED: 7 min
CSEPEDS: 15 s
CSEPHR: 93 %
Estimated workload: 8.9 METS
Peak HR: 141 {beats}/min
Rest HR: 78 {beats}/min

## 2015-10-19 ENCOUNTER — Encounter: Payer: Self-pay | Admitting: Cardiovascular Disease

## 2015-10-19 ENCOUNTER — Ambulatory Visit (INDEPENDENT_AMBULATORY_CARE_PROVIDER_SITE_OTHER): Payer: Medicare Other | Admitting: Cardiovascular Disease

## 2015-10-19 DIAGNOSIS — I493 Ventricular premature depolarization: Secondary | ICD-10-CM | POA: Diagnosis not present

## 2015-10-19 DIAGNOSIS — IMO0001 Reserved for inherently not codable concepts without codable children: Secondary | ICD-10-CM

## 2015-10-19 DIAGNOSIS — R030 Elevated blood-pressure reading, without diagnosis of hypertension: Secondary | ICD-10-CM

## 2015-10-19 HISTORY — DX: Reserved for inherently not codable concepts without codable children: IMO0001

## 2015-10-19 HISTORY — DX: Ventricular premature depolarization: I49.3

## 2015-10-19 NOTE — Progress Notes (Signed)
Cardiology Office Note   Date:  10/19/2015   ID:  Lavada MesiDilip T Zentz, DOB [birth year], MRN [id number]  PCP:  Gwynneth AlimentSANDERS,ROBYN N, MD  Cardiologist:   Chilton Siiffany Alger, MD   No chief complaint on file.     History of Present Illness: Dilip T Sherryll BurgerShah is a 70 y.o. male with hyperlipidemia, ASD s/p surgical repair, and mild ascending aorta (3.6 cm) aneurysm who presents for follow up on chest pain.  Mr. Sherryll BurgerShah was seen by Dr. Dorothyann Pengobyn Sanders on 08/25/15.  At that appointment he reported occasional episodes of left sided chest pain.  He was seen in clinic on 09/16/15 and referred for ETT, which was negative for ischemia.  He was noted to have several PVCs.  Mr. Sherryll BurgerShah has been feeling well.  He denies recurrent chest pain.  He also hasn't noted any palpitations, lightheadedness or dizziness. He denies lower extremity edema, orthopnea, or PND. Mr. Clelia CroftShaw has not been checking his blood pressure regularly. However he is concerned that it was elevated during his stress test. He reports eating a healthy diet and is vegetarian. He doesn't use a lot of salt but has not made any attempts to limit his salt intake.   Past Medical History:  Diagnosis Date  . Allergic rhinitis   . Atypical chest pain 09/16/2015  . Elevated blood pressure 10/19/2015  . Insomnia   . Mixed hyperlipidemia   . PVC (premature ventricular contraction) 10/19/2015    Past Surgical History:  Procedure Laterality Date  . ASD REPAIR  1994  . INGUINAL HERNIA REPAIR  1994     Current Outpatient Prescriptions  Medication Sig Dispense Refill  . aspirin 81 MG tablet Take 81 mg by mouth daily.    Marland Kitchen. CALCIUM PO Take 1 tablet by mouth daily.     . Cholecalciferol (VITAMIN D PO) Take 1 capsule by mouth daily.     . Multiple Vitamin (MULTIVITAMIN) capsule Take 1 capsule by mouth daily.    . Omega-3 Fatty Acids (FISH OIL PO) Take 1 capsule by mouth daily.      No current facility-administered medications for this visit.     Allergies:   Review of  patient's allergies indicates no known allergies.    Social History:  The patient  reports that he quit smoking about 9 years ago. He quit after 9.00 years of use. He does not have any smokeless tobacco history on file. He reports that he drinks about 12.6 oz of alcohol per week . He reports that he does not use drugs.   Family History:  The patient's family history includes Heart attack in his father; Pulmonary embolism in his brother.    ROS:  Please see the history of present illness.   Otherwise, review of systems are positive for none.   All other systems are reviewed and negative.    PHYSICAL EXAM: VS:  BP 132/90   Pulse 65   Ht 5\' 10"  (1.778 m)   Wt 172 lb 12.8 oz (78.4 kg)   BMI 24.79 kg/m  , BMI Body mass index is 24.79 kg/m. GENERAL:  Well appearing HEENT:  Pupils equal round and reactive, fundi not visualized, oral mucosa unremarkable NECK:  No jugular venous distention, waveform within normal limits, carotid upstroke brisk and symmetric, no bruits LYMPHATICS:  No cervical adenopathy LUNGS:  Clear to auscultation bilaterally HEART:  RRR.  PMI not displaced or sustained,S1 and S2 within normal limits, no S3, no S4, no clicks, no rubs, I/VI systolic  murmur at the LUSB ABD:  Flat, positive bowel sounds normal in frequency in pitch, no bruits, no rebound, no guarding, no midline pulsatile mass, no hepatomegaly, no splenomegaly EXT:  2 plus pulses throughout, no edema, no cyanosis no clubbing SKIN:  No rashes no nodules NEURO:  Cranial nerves II through XII grossly intact, motor grossly intact throughout PSYCH:  Cognitively intact, oriented to person place and time   EKG:  EKG is not ordered today. The ekg ordered 09/16/15 demonstrates sinus rhythm rate 66 bpm.  ETT 10/07/15: Blood pressure demonstrated a normal response to exercise.  Upsloping ST segment depression ST segment depression was noted during stress in the V4, V5, V6, II, III and aVF leads.  No T wave inversion  was noted during stress.  Negative, adequate stress test.     Recent Labs: 12/30/2014: ALT 24; BUN 15; Creatinine, Ser 0.88; Potassium 5.2; Sodium 137    Lipid Panel No results found for: CHOL, TRIG, HDL, CHOLHDL, VLDL, LDLCALC, LDLDIRECT    Wt Readings from Last 3 Encounters:  10/19/15 172 lb 12.8 oz (78.4 kg)  09/16/15 174 lb 9.6 oz (79.2 kg)  02/09/15 165 lb (74.8 kg)      ASSESSMENT AND PLAN:  # Atypical chest pain: Mr. Medford Denies any recurrent chest pain. ETT was negative for ischemia.  # PVCs: Mr. Jost had many PVCs during his stress test. He was asymptomatic and has not had any palpitations.  He is due for his annual exam with Dr. Allyne Gee this month and will have lab work.  Potassium was within normal limits when last checked.   # Hyperlipidemia: Managed by Dr. Allyne Gee.  Reportedly well-controlled.   # Elevated blood pressure:  Mr. Plouff blood pressure is consistently at the upper limit of normal or slightly above limit.  He is amenable to taking medication if necessary, but would like to avoid to possible. He overall has a fairly healthy lifestyle and exercises regularly. We discussed a trial of lowering his salt intake to see if that will help. He will obtain a blood pressure monitor and check his blood pressures at home prior to his appointment with Dr. Allyne Gee next month.  Current medicines are reviewed at length with the patient today.  The patient does not have concerns regarding medicines.  The following changes have been made:  no change  Labs/ tests ordered today include:   No orders of the defined types were placed in this encounter.    Disposition:   FU with Ashraf Mesta C. Duke Salvia, MD, [facility] as needed.    This note was written with the assistance of speech recognition software.  Please excuse any transcriptional errors.  Signed, Katlin Ciszewski C. Duke Salvia, MD, [facility]  10/19/2015 9:43 AM    [facility]

## 2015-10-19 NOTE — Patient Instructions (Signed)
Your physician recommends that you schedule a follow-up appointment if needed.

## 2015-10-27 DIAGNOSIS — Z0000 Encounter for general adult medical examination without abnormal findings: Secondary | ICD-10-CM | POA: Diagnosis not present

## 2015-10-27 DIAGNOSIS — E559 Vitamin D deficiency, unspecified: Secondary | ICD-10-CM | POA: Diagnosis not present

## 2015-10-27 DIAGNOSIS — R7309 Other abnormal glucose: Secondary | ICD-10-CM | POA: Diagnosis not present

## 2015-10-27 DIAGNOSIS — E781 Pure hyperglyceridemia: Secondary | ICD-10-CM | POA: Diagnosis not present

## 2015-10-27 DIAGNOSIS — E785 Hyperlipidemia, unspecified: Secondary | ICD-10-CM | POA: Diagnosis not present

## 2015-10-27 DIAGNOSIS — G4700 Insomnia, unspecified: Secondary | ICD-10-CM | POA: Diagnosis not present

## 2016-01-12 DIAGNOSIS — Z1389 Encounter for screening for other disorder: Secondary | ICD-10-CM | POA: Diagnosis not present

## 2016-01-12 DIAGNOSIS — G4700 Insomnia, unspecified: Secondary | ICD-10-CM | POA: Diagnosis not present

## 2016-01-12 DIAGNOSIS — Z6826 Body mass index (BMI) 26.0-26.9, adult: Secondary | ICD-10-CM | POA: Diagnosis not present

## 2016-02-09 ENCOUNTER — Ambulatory Visit: Payer: Medicare Other | Admitting: Neurology

## 2016-02-22 ENCOUNTER — Telehealth: Payer: Self-pay

## 2016-02-22 NOTE — Telephone Encounter (Addendum)
[facility] [place] [facility] [place] but this drug needs a pa.  I do not see that pt is still taking ambien; it is not on his medication list.  Also, I [facility] not have any [facility] information on this pt. If a pa is required, I do need this information.  If pt will want refills on ambien, he will also need a follow up appt with Dr. Vickey Hugerohmeier.  I called pt to discuss. No answer, left a message asking him to call me back.

## 2016-02-28 NOTE — Telephone Encounter (Signed)
Patient called to return nurse's call.  Please call

## 2016-02-29 NOTE — Telephone Encounter (Signed)
I spoke to patient, he states to disregard, he is not taking.

## 2017-01-23 ENCOUNTER — Other Ambulatory Visit (HOSPITAL_COMMUNITY): Payer: Self-pay | Admitting: Internal Medicine

## 2017-01-23 DIAGNOSIS — R1310 Dysphagia, unspecified: Secondary | ICD-10-CM

## 2017-01-29 ENCOUNTER — Ambulatory Visit (HOSPITAL_COMMUNITY): Payer: Medicare Other

## 2017-05-22 ENCOUNTER — Telehealth: Payer: Self-pay | Admitting: Cardiovascular Disease

## 2017-05-22 NOTE — Telephone Encounter (Signed)
New Message:      Dr. Allyne Gee is calling in regards to new onset Afib

## 2017-05-22 NOTE — Telephone Encounter (Signed)
Spoke with Dr Allyne Gee CMA regarding patient. Dr Allyne Gee was wanting to speak with Dr Duke Salvia, explained she was out of the office. I could have Dr Duke Salvia call tomorrow or she could see patient tomorrow afternoon if ok to wait. Dr Allyne Gee requested appointment. Patient was asymptomatic and started on Metoprolol 25 mg 1/2 tablet twice a day. EKG's faxed and they will call patient and advise of appointment.

## 2017-05-23 ENCOUNTER — Ambulatory Visit: Payer: Medicare Other | Admitting: Cardiovascular Disease

## 2017-05-23 ENCOUNTER — Encounter: Payer: Self-pay | Admitting: Cardiovascular Disease

## 2017-05-23 VITALS — BP 131/82 | HR 136 | Ht 69.0 in | Wt 167.6 lb

## 2017-05-23 DIAGNOSIS — I4891 Unspecified atrial fibrillation: Secondary | ICD-10-CM | POA: Diagnosis not present

## 2017-05-23 DIAGNOSIS — R0789 Other chest pain: Secondary | ICD-10-CM | POA: Diagnosis not present

## 2017-05-23 DIAGNOSIS — E7800 Pure hypercholesterolemia, unspecified: Secondary | ICD-10-CM | POA: Diagnosis not present

## 2017-05-23 MED ORDER — METOPROLOL SUCCINATE ER 25 MG PO TB24: 25.0000 mg | ORAL_TABLET | Freq: Every day | ORAL | 3 refills | Status: DC

## 2017-05-23 MED ORDER — APIXABAN 5 MG PO TABS: 5.0000 mg | ORAL_TABLET | Freq: Two times a day (BID) | ORAL | 5 refills | Status: DC

## 2017-05-23 MED ORDER — METOPROLOL SUCCINATE ER 25 MG PO TB24: 25.0000 mg | ORAL_TABLET | Freq: Two times a day (BID) | ORAL | 3 refills | Status: DC

## 2017-05-23 NOTE — Patient Instructions (Addendum)
Medication Instructions:  START ELIQUIS 5 MG TWICE A DAY   INCREASE METOPROLOL TO 25 MG TWICE A DAY (FULL TABLET)   STOP ASPIRIN   Labwork: WILL GET YOUR LABS FROM DR Allyne Gee  Testing/Procedures: Your physician has requested that you have an echocardiogram. Echocardiography is a painless test that uses sound waves to create images of your heart. It provides your doctor with information about the size and shape of your heart and how well your heart's chambers and valves are working. This procedure takes approximately one hour. There are no restrictions for this procedure. [facility] AT [address]  Follow-Up: Your physician recommends that you schedule a follow-up appointment in: WITH DONNA CARROLL NP IN AFIB CLINIC Tuesday 05/28/17 ARRIVE AROUND 9:00 FOR 9:15  Your physician recommends that you schedule a follow-up appointment in: 1 MONTH OV WITH PA  If you need a refill on your cardiac medications before your next appointment, please call your pharmacy.  GO TO FIRE DEPARTMENT TOMORROW TO GET BLOOD PRESSURE AND HEART RATE CALL AND LET us KNOW NUMBERS [phone]  Echocardiogram An echocardiogram, or echocardiography, uses sound waves (ultrasound) to produce an image of your heart. The echocardiogram is simple, painless, obtained within a short period of time, and offers valuable information to your health care provider. The images from an echocardiogram can provide information such as:  Evidence of coronary artery disease (CAD).  Heart size.  Heart muscle function.  Heart valve function.  Aneurysm detection.  Evidence of a past heart attack.  Fluid buildup around the heart.  Heart muscle thickening.  Assess heart valve function.  Tell a health care provider about:  Any allergies you have.  All medicines you are taking, including vitamins, herbs, eye drops, creams, and over-the-counter medicines.  Any problems you or family members have had with  anesthetic medicines.  Any blood disorders you have.  Any surgeries you have had.  Any medical conditions you have.  Whether you are pregnant or may be pregnant. What happens before the procedure? No special preparation is needed. Eat and drink normally. What happens during the procedure?  In order to produce an image of your heart, gel will be applied to your chest and a wand-like tool (transducer) will be moved over your chest. The gel will help transmit the sound waves from the transducer. The sound waves will harmlessly bounce off your heart to allow the heart images to be captured in real-time motion. These images will then be recorded.  You may need an IV to receive a medicine that improves the quality of the pictures. What happens after the procedure? You may return to your normal schedule including diet, activities, and medicines, unless your health care provider tells you otherwise. This information is not intended to replace advice given to you by your health care provider. Make sure you discuss any questions you have with your health care provider. Document Released: 01/06/2000 Document Revised: 08/27/2015 Document Reviewed: 09/15/2012 Elsevier Interactive Patient Education  2017 [employer].

## 2017-05-23 NOTE — Progress Notes (Signed)
Cardiology Office Note   Date:  05/23/2017   ID:  Carlos [name], DOB [birth year], MRN [id number]  PCP:  Dorothyann Peng, MD  Cardiologist:   Chilton Si, MD   Chief Complaint  Patient presents with  . Follow-up     History of Present Illness: Carlos [name] is a 72 y.o. male with paroxysmal atrial fibrillation, hyperlipidemia, ASD s/p surgical repair, and mild ascending aorta (3.6 cm) aneurysm who presents for new onset atrial fibrillation.  He was initially seen 08/2015 due to L sided chest pain.  He was referred for ETT, which was negative for ischemia.  He was noted to have several PVCs but was asymptomatic.  Carlos [name] followed up with Dr. Allyne Gee on 05/22/17 due to dysphagia.  While there his heart rate was 130 bpm and he was in atrial fibrillation.  He was started on metoprolol and referred to cardiology.  He has been feeling well other than a slight discomfort in his left arm.  He denies palpitations, shortness of breath, lower extremity edema, orthopnea, or PND.  He has no chest pain or pressure.  He also has not noted any lightheadedness or dizziness.  He continues to follow a healthy diet.  He has 1 tea daily and no other caffeine.  He occasionally drinks red wine.   Past Medical History:  Diagnosis Date  . Allergic rhinitis   . Atypical chest pain 09/16/2015  . Elevated blood pressure 10/19/2015  . Insomnia   . Mixed hyperlipidemia   . PVC (premature ventricular contraction) 10/19/2015    Past Surgical History:  Procedure Laterality Date  . ASD REPAIR  1994  . INGUINAL HERNIA REPAIR  1994     Current Outpatient Medications  Medication Sig Dispense Refill  . CALCIUM PO Take 1 tablet by mouth daily.     . Cholecalciferol (VITAMIN D PO) Take 1 capsule by mouth daily.     . metoprolol succinate (TOPROL-XL) 25 MG 24 hr tablet Take 1 tablet (25 mg total) by mouth 2 (two) times daily. 180 tablet 3  . Multiple Vitamin (MULTIVITAMIN) capsule Take 1 capsule by mouth daily.     . Omega-3 Fatty Acids (FISH OIL PO) Take 1 capsule by mouth daily.     . raNITIdine HCl (ZANTAC PO) Take by mouth.    Marland Kitchen apixaban (ELIQUIS) 5 MG TABS tablet Take 1 tablet (5 mg total) by mouth 2 (two) times daily. 60 tablet 5   No current facility-administered medications for this visit.     Allergies:   Patient has no known allergies.    Social History:  The patient  reports that he quit smoking about 11 years ago. He quit after 9.00 years of use. He has never used smokeless tobacco. He reports that he drinks about 12.6 oz of alcohol per week. He reports that he does not use drugs.   Family History:  The patient's family history includes Heart attack in his father; Pulmonary embolism in his brother.    ROS:  Please see the history of present illness.   Otherwise, review of systems are positive for none.   All other systems are reviewed and negative.    PHYSICAL EXAM: VS:  BP 131/82   Pulse (!) 136   Ht  (1.753 m)   Wt 167 lb 9.6 oz (76 kg)   BMI 24.75 kg/m  , BMI Body mass index is 24.75 kg/m. GENERAL:  Well appearing HEENT: Pupils equal round and reactive, fundi not visualized, oral  mucosa unremarkable NECK:  No jugular venous distention, waveform within normal limits, carotid upstroke brisk and symmetric, no bruits, no thyromegaly LYMPHATICS:  No cervical adenopathy LUNGS:  Clear to auscultation bilaterally HEART:  .  PMI not displaced or sustained,S1 and S2 within normal limits, no S3, no S4, no clicks, no rubs, no murmurs ABD:  Flat, positive bowel sounds normal in frequency in pitch, no bruits, no rebound, no guarding, no midline pulsatile mass, no hepatomegaly, no splenomegaly EXT:  2 plus pulses throughout, no edema, no cyanosis no clubbing SKIN:  No rashes no nodules NEURO:  Cranial nerves II through XII grossly intact, motor grossly intact throughout PSYCH:  Cognitively intact, oriented to person place and time' tachycardic.  Irregularly irregular.   EKG:  EKG  is ordered today. The ekg ordered 09/16/15 demonstrates sinus rhythm rate 66 bpm. 05/22/17: Atrial flutter.  Ventricular rate 130 bpm.    ETT 10/07/15: Blood pressure demonstrated a normal response to exercise.  Upsloping ST segment depression ST segment depression was noted during stress in the V4, V5, V6, II, III and aVF leads.  No T wave inversion was noted during stress.  Negative, adequate stress test.     Recent Labs: No results found for requested labs within last 8760 hours.    Lipid Panel No results found for: CHOL, TRIG, HDL, CHOLHDL, VLDL, LDLCALC, LDLDIRECT    Wt Readings from Last 3 Encounters:  05/23/17 167 lb 9.6 oz (76 kg)  10/19/15 172 lb 12.8 oz (78.4 kg)  09/16/15 174 lb 9.6 oz (79.2 kg)      ASSESSMENT AND PLAN:  # Paroxysmal atrial fibrillation: Carlos [name] remains in atrial fibrillation.  His rate is poorly controlled.  Increase metoprolol to 25 mg twice daily.  We will stop aspirin and start Eliquis 5 mg twice daily.  He had labs drawn with Dr. Allyne Gee yesterday.  We will get a copy of these.  Order an echocardiogram.  I have asked him to check his blood pressure and heart rate tomorrow and call us with the results.  # Atypical chest pain:   Resolved.   ETT negative for ischemia.  # Hyperlipidemia: We will get a copy of his lipids from Dr. Allyne Gee.    Current medicines are reviewed at length with the patient today.  The patient does not have concerns regarding medicines.  The following changes have been made:  no change  Labs/ tests ordered today include:   Orders Placed This Encounter  Procedures  . ECHOCARDIOGRAM COMPLETE     Disposition:   FU with Wisam Siefring C. Duke Salvia, MD, [facility] in 1 month.  Follow-up in H fibrillation clinic next week.     Signed, Julyan Gales C. Duke Salvia, MD, [facility]  05/23/2017 5:39 PM    [facility]

## 2017-05-27 ENCOUNTER — Telehealth (HOSPITAL_COMMUNITY): Payer: Self-pay | Admitting: *Deleted

## 2017-05-27 NOTE — Telephone Encounter (Signed)
Patient called in to cancel his appointment with afib clinic stating "he didn't think it was necessary nor did he want to come" and he has doctors in his family that state his acid reflux is the issue. Questioned pt on HR currently still in the 105-120 range. He does state he started the Eliquis and metoprolol. Attempted to help patient understand need for follow up appointment but he was adamant to cancel the appointment. I told patient I would make Dr. Duke Salvia aware.

## 2017-05-28 ENCOUNTER — Ambulatory Visit (HOSPITAL_COMMUNITY): Payer: Medicare Other | Admitting: Nurse Practitioner

## 2017-06-07 ENCOUNTER — Ambulatory Visit (HOSPITAL_COMMUNITY): Payer: Medicare Other | Attending: Cardiovascular Disease

## 2017-06-07 ENCOUNTER — Other Ambulatory Visit: Payer: Self-pay

## 2017-06-07 DIAGNOSIS — I081 Rheumatic disorders of both mitral and tricuspid valves: Secondary | ICD-10-CM | POA: Insufficient documentation

## 2017-06-07 DIAGNOSIS — E785 Hyperlipidemia, unspecified: Secondary | ICD-10-CM | POA: Diagnosis not present

## 2017-06-07 DIAGNOSIS — I4892 Unspecified atrial flutter: Secondary | ICD-10-CM | POA: Insufficient documentation

## 2017-06-07 DIAGNOSIS — I4891 Unspecified atrial fibrillation: Secondary | ICD-10-CM

## 2017-06-18 ENCOUNTER — Telehealth: Payer: Self-pay | Admitting: Cardiovascular Disease

## 2017-06-18 NOTE — Telephone Encounter (Signed)
Advised patient of echo results.

## 2017-06-18 NOTE — Telephone Encounter (Signed)
Pt returning call to nurse

## 2017-06-18 NOTE — Telephone Encounter (Signed)
-----   Message from Chilton Si, MD sent at 06/18/2017 10:07 AM EDT ----- Echo shows that his heart is squeezing well.  Moderate leaking of the mitral valve.  He is still in atrial flutter.

## 2017-06-18 NOTE — Telephone Encounter (Signed)
Left message to call back  

## 2017-06-18 NOTE — Telephone Encounter (Signed)
New message ° ° ° °Patient calling for echo results °

## 2017-06-26 ENCOUNTER — Ambulatory Visit (INDEPENDENT_AMBULATORY_CARE_PROVIDER_SITE_OTHER): Payer: Medicare Other | Admitting: Cardiology

## 2017-06-26 ENCOUNTER — Encounter: Payer: Self-pay | Admitting: Cardiology

## 2017-06-26 DIAGNOSIS — I480 Paroxysmal atrial fibrillation: Secondary | ICD-10-CM | POA: Diagnosis not present

## 2017-06-26 DIAGNOSIS — Z7901 Long term (current) use of anticoagulants: Secondary | ICD-10-CM

## 2017-06-26 NOTE — Progress Notes (Signed)
06/26/2017 Carlos [name]   1945-02-06  [id number]  Primary Physician Dorothyann PengSanders, Robyn, MD Primary Cardiologist: Dr Duke Salviaandolph  HPI:  72 y/o male with a history of remote ASD repair, seen in 2017 with atypical chest pain/. He was seen by Dr Duke Salviaandolph 05/22/17 with new onset, unknown duration atrial flutter, sent by Dr Allyne GeeSanders. He was placed on a beta blocker and Eliquis. Echo showed normal LVF, mild LAE, and mild to moderate MR. He is in the office today for follow up. He remains asymptomatic. He is in atrial flutter.    Current Outpatient Medications  Medication Sig Dispense Refill  . apixaban (ELIQUIS) 5 MG TABS tablet Take 1 tablet (5 mg total) by mouth 2 (two) times daily. 60 tablet 5  . CALCIUM PO Take 1 tablet by mouth daily.     . Cholecalciferol (VITAMIN D PO) Take 1 capsule by mouth daily.     Marland Kitchen. levocetirizine (XYZAL) 5 MG tablet     . metoprolol succinate (TOPROL-XL) 25 MG 24 hr tablet Take 1 tablet (25 mg total) by mouth 2 (two) times daily. 180 tablet 3  . Multiple Vitamin (MULTIVITAMIN) capsule Take 1 capsule by mouth daily.    . Omega-3 Fatty Acids (FISH OIL PO) Take 1 capsule by mouth daily.     . raNITIdine HCl (ZANTAC PO) Take by mouth.     No current facility-administered medications for this visit.     No Known Allergies  Past Medical History:  Diagnosis Date  . Allergic rhinitis   . Atypical chest pain 09/16/2015  . Elevated blood pressure 10/19/2015  . Insomnia   . Mixed hyperlipidemia   . PVC (premature ventricular contraction) 10/19/2015    Social History   Socioeconomic History  . Marital status: Divorced    Spouse name: Not on file  . Number of children: Not on file  . Years of education: Not on file  . Highest education level: Not on file  Occupational History  . Not on file  Social Needs  . Financial resource strain: Not on file  . Food insecurity:    Worry: Not on file    Inability: Not on file  . Transportation needs:    Medical: Not on file     Non-medical: Not on file  Tobacco Use  . Smoking status: Former Smoker    Years: 9.00    Last attempt to quit: 01/22/2006    Years since quitting: 11.4  . Smokeless tobacco: Never Used  Substance and Sexual Activity  . Alcohol use: Yes    Alcohol/week: 12.6 oz    Types: 21 Standard drinks or equivalent per week    Comment: 2-3 glasses per day  . Drug use: No  . Sexual activity: Not on file  Lifestyle  . Physical activity:    Days per week: Not on file    Minutes per session: Not on file  . Stress: Not on file  Relationships  . Social connections:    Talks on phone: Not on file    Gets together: Not on file    Attends religious service: Not on file    Active member of club or organization: Not on file    Attends meetings of clubs or organizations: Not on file    Relationship status: Not on file  . Intimate partner violence:    Fear of current or ex partner: Not on file    Emotionally abused: Not on file    Physically abused: Not on  file    Forced sexual activity: Not on file  Other Topics Concern  . Not on file  Social History Narrative   Drinks 1-2 cups of caffeine daily.     Family History  Problem Relation Age of Onset  . Heart attack Father   . Pulmonary embolism Brother      Review of Systems: General: negative for chills, fever, night sweats or weight changes.  Cardiovascular: negative for chest pain, dyspnea on exertion, edema, orthopnea, palpitations, paroxysmal nocturnal dyspnea or shortness of breath Dermatological: negative for rash Respiratory: negative for cough or wheezing Urologic: negative for hematuria Abdominal: negative for nausea, vomiting, diarrhea, bright red blood per rectum, melena, or hematemesis Neurologic: negative for visual changes, syncope, or dizziness All other systems reviewed and are otherwise negative except as noted above.    Blood pressure 118/78, pulse (!) 102, height 5' 10.5" (1.791 m), weight 167 lb 3.2 oz (75.8 kg).    General appearance: alert, cooperative and no distress Neck: no carotid bruit and no JVD Lungs: clear to auscultation bilaterally Heart: irregularly irregular rhythm Extremities: extremities normal, atraumatic, no cyanosis or edema Skin: Skin color, texture, turgor normal. No rashes or lesions Neurologic: Grossly normal  EKG atrial flutter with variable AVB  ASSESSMENT AND PLAN:   PAF (paroxysmal atrial fibrillation) (HCC) Unknown duration- picked up 05/22/17. Mild LAE and normal LVF by echo, he is essentially asymptomatic.   Anticoagulated CHADs VASc= 1 for age. He is on Eliquis but has missed at least 2 doses in the past 4 weeks.   Atypical chest pain Negative POET in 2017   PLAN  I have reviewed labs from Dr Allyne Gee office including TSH (WNL). Will discuss options with Dr Duke Salvia- 1. Do nothing since he is asymptomatic 2. DCCV after 4 weeks of uninterrupted anticoagulation 3. EP referral for RFA   Corine Shelter PA-C 06/26/2017 2:58 PM

## 2017-06-26 NOTE — Assessment & Plan Note (Signed)
Unknown duration- picked up 05/22/17. Mild LAE and normal LVF by echo, he is essentially asymptomatic.

## 2017-06-26 NOTE — Assessment & Plan Note (Signed)
CHADs VASc= 1 for age. He is on Eliquis but has missed at least 2 doses in the past 4 weeks.

## 2017-06-26 NOTE — Assessment & Plan Note (Signed)
Negative POET in 2017

## 2017-06-26 NOTE — Patient Instructions (Signed)
Medication Instructions: Your physician recommends that you continue on your current medications as directed. Please refer to the Current Medication list given to you today.  If you need a refill on your cardiac medications before your next appointment, please call your pharmacy.    Follow-Up: Will contact you for follow up instructions  Special Instructions:    Thank you for choosing [facility]!!

## 2017-06-27 ENCOUNTER — Telehealth: Payer: Self-pay

## 2017-06-27 MED ORDER — METOPROLOL SUCCINATE ER 50 MG PO TB24: 50.0000 mg | ORAL_TABLET | Freq: Two times a day (BID) | ORAL | 3 refills | Status: DC

## 2017-06-27 NOTE — Telephone Encounter (Signed)
Per luke he spoke with Dr Duke Salviaandolph and decided for patient to increase Metoprol to 50mg  bid; informed patient and patient voiced understanding.

## 2017-07-01 ENCOUNTER — Other Ambulatory Visit: Payer: Self-pay | Admitting: *Deleted

## 2017-07-01 MED ORDER — METOPROLOL TARTRATE 25 MG PO TABS: 25.0000 mg | ORAL_TABLET | Freq: Two times a day (BID) | ORAL | 1 refills | Status: DC

## 2017-07-24 ENCOUNTER — Telehealth: Payer: Self-pay | Admitting: Cardiovascular Disease

## 2017-07-24 MED ORDER — METOPROLOL TARTRATE 50 MG PO TABS
50.0000 mg | ORAL_TABLET | Freq: Two times a day (BID) | ORAL | 2 refills | Status: DC
Start: 2017-07-24 — End: 2017-09-12

## 2017-07-24 NOTE — Telephone Encounter (Signed)
See phone note 6/6, Metoprolol increased to 50 mg twice a day. [facility]

## 2017-07-24 NOTE — Telephone Encounter (Signed)
New Message     Pt c/o medication issue:  1. Name of Medication: metoprolol tartrate (LOPRESSOR) 25 MG tablet  2. How are you currently taking this medication (dosage and times per day)?   3. Are you having a reaction (difficulty breathing--STAT)?   4. What is your medication issue? Patient says that his PCP increased the dosage and he has the following questions. 1. Was that okay? 2. If so can he get a refill. Please call

## 2017-08-07 ENCOUNTER — Telehealth: Payer: Self-pay | Admitting: Cardiovascular Disease

## 2017-08-07 MED ORDER — APIXABAN 5 MG PO TABS: 5.0000 mg | ORAL_TABLET | Freq: Two times a day (BID) | ORAL | 1 refills | Status: DC

## 2017-08-07 NOTE — Telephone Encounter (Signed)
Advised no Eliquis at this time and [facility]

## 2017-08-07 NOTE — Telephone Encounter (Signed)
Patient aware of up coming appointment with Dr. Duke Salviaandolph. Patient requested samples of Eliquis. Patient has been given sample of eliquis on 06/26/17 and 07/24/17. Patient was given 6 weeks worth, plus 30 day free card. Will send message to Dr. Leonides Sakeandolph's nurse about getting samples for patient or getting financial assistance. Informed patient that he would be called if samples are available.

## 2017-08-07 NOTE — Telephone Encounter (Signed)
New Message:      Pt is calling in reference to a future appt.

## 2017-09-12 ENCOUNTER — Ambulatory Visit: Payer: Medicare Other | Admitting: Cardiovascular Disease

## 2017-09-12 ENCOUNTER — Encounter: Payer: Self-pay | Admitting: Cardiovascular Disease

## 2017-09-12 VITALS — BP 119/87 | HR 96 | Ht 70.5 in | Wt 166.2 lb

## 2017-09-12 DIAGNOSIS — E7800 Pure hypercholesterolemia, unspecified: Secondary | ICD-10-CM | POA: Diagnosis not present

## 2017-09-12 DIAGNOSIS — I481 Persistent atrial fibrillation: Secondary | ICD-10-CM | POA: Diagnosis not present

## 2017-09-12 DIAGNOSIS — I4819 Other persistent atrial fibrillation: Secondary | ICD-10-CM

## 2017-09-12 MED ORDER — APIXABAN 5 MG PO TABS: 5.0000 mg | ORAL_TABLET | Freq: Two times a day (BID) | ORAL | 3 refills | Status: DC

## 2017-09-12 MED ORDER — METOPROLOL TARTRATE 50 MG PO TABS
50.0000 mg | ORAL_TABLET | Freq: Two times a day (BID) | ORAL | 3 refills | Status: DC
Start: 2017-09-12 — End: 2018-09-24

## 2017-09-12 NOTE — Patient Instructions (Signed)
Medication Instructions:  °Your physician recommends that you continue on your current medications as directed. Please refer to the Current Medication list given to you today.  ° °Labwork: °NONE ° °Testing/Procedures: °NONE ° °Follow-Up: °Your physician wants you to follow-up in: 6 MONTHS  You will receive a reminder letter in the mail two months in advance. If you don't receive a letter, please call our office to schedule the follow-up appointment. ° °If you need a refill on your cardiac medications before your next appointment, please call your pharmacy. °

## 2017-09-12 NOTE — Progress Notes (Signed)
Cardiology Office Note   Date:  09/12/2017   ID:  Carlos [name], DOB [birth year], MRN [id number]  PCP:  Dorothyann Peng, MD  Cardiologist:   Chilton Si, MD   No chief complaint on file.    History of Present Illness: Carlos [name] is a 72 y.o. male with persistentl atrial fibrillation, hyperlipidemia, ASD s/p surgical repair, and mild ascending aorta (3.6 cm) aneurysm who presents for follow up.  He was initially seen 08/2015 due to L sided chest pain.  He was referred for ETT, which was negative for ischemia.  He was noted to have several PVCs but was asymptomatic.  Dr. Clelia Croft followed up with Dr. Allyne Gee on 05/22/17 due to dysphagia.  While there his heart rate was 130 bpm and he was in atrial fibrillation.  He was started on metoprolol and referred to cardiology.  Dr. Sherryll Burger had an echo 05/2017 that revealed LVEF 60-65%.  He was also started on Eliquis.  His heart rate at home has been in the 80s-low 100s.  He has been asymptomatic.  He walks for 20 minutes 3 times per day.  He has no shortness of breath, chest pain, or edema.  He denies orthopnea or PND.  His only complaint is difficulty falling asleep.  He has a healthy diet and rarely eats red meat or fried foods.     Past Medical History:  Diagnosis Date  . Allergic rhinitis   . Atypical chest pain 09/16/2015  . Elevated blood pressure 10/19/2015  . Insomnia   . Mixed hyperlipidemia   . PVC (premature ventricular contraction) 10/19/2015    Past Surgical History:  Procedure Laterality Date  . ASD REPAIR  1994  . INGUINAL HERNIA REPAIR  1994     Current Outpatient Medications  Medication Sig Dispense Refill  . apixaban (ELIQUIS) 5 MG TABS tablet Take 1 tablet (5 mg total) by mouth 2 (two) times daily. 180 tablet 1  . CALCIUM PO Take 1 tablet by mouth daily.     . Cholecalciferol (VITAMIN D PO) Take 1 capsule by mouth daily.     Marland Kitchen levocetirizine (XYZAL) 5 MG tablet     . metoprolol tartrate (LOPRESSOR) 50 MG tablet Take 1  tablet (50 mg total) by mouth 2 (two) times daily. 180 tablet 2  . Multiple Vitamin (MULTIVITAMIN) capsule Take 1 capsule by mouth daily.    . Omega-3 Fatty Acids (FISH OIL PO) Take 1 capsule by mouth daily.     . raNITIdine HCl (ZANTAC PO) Take by mouth.     No current facility-administered medications for this visit.     Allergies:   Patient has no known allergies.    Social History:  The patient  reports that he quit smoking about 11 years ago. He quit after 9.00 years of use. He has never used smokeless tobacco. He reports that he drinks about 21.0 standard drinks of alcohol per week. He reports that he does not use drugs.   Family History:  The patient's family history includes Heart attack in his father; Pulmonary embolism in his brother.    ROS:  Please see the history of present illness.   Otherwise, review of systems are positive for none.   All other systems are reviewed and negative.    PHYSICAL EXAM: VS:  BP 119/87   Pulse 96   Ht 5' 10.5" (1.791 m)   Wt 166 lb 3.2 oz (75.4 kg)   BMI 23.51 kg/m  , BMI Body mass index  is 23.51 kg/m. GENERAL:  Well appearing HEENT: Pupils equal round and reactive, fundi not visualized, oral mucosa unremarkable NECK:  No jugular venous distention, waveform within normal limits, carotid upstroke brisk and symmetric, no bruits LUNGS:  Clear to auscultation bilaterally HEART:  Irregularly irregular.  PMI not displaced or sustained,S1 and S2 within normal limits, no S3, no S4, no clicks, no rubs, no murmurs ABD:  Flat, positive bowel sounds normal in frequency in pitch, no bruits, no rebound, no guarding, no midline pulsatile mass, no hepatomegaly, no splenomegaly EXT:  2 plus pulses throughout, no edema, no cyanosis no clubbing SKIN:  No rashes no nodules NEURO:  Cranial nerves II through XII grossly intact, motor grossly intact throughout PSYCH:  Cognitively intact, oriented to person place and time   EKG:  EKG is not ordered today. The  ekg ordered 09/16/15 demonstrates sinus rhythm rate 66 bpm. 05/22/17: Atrial flutter.  Ventricular rate 130 bpm.    ETT 10/07/15: Blood pressure demonstrated a normal response to exercise.  Upsloping ST segment depression ST segment depression was noted during stress in the V4, V5, V6, II, III and aVF leads.  No T wave inversion was noted during stress.  Negative, adequate stress test.   Echo 05/2017: Study Conclusions  - Left ventricle: The cavity size was normal. Systolic function was   normal. The estimated ejection fraction was in the range of 60%   to 65%. Wall motion was normal; there were no regional wall   motion abnormalities. - Ventricular septum: Septal motion showed paradox. - Mitral valve: There was mild to moderate regurgitation directed   centrally. - Left atrium: The atrium was mildly dilated. - Right ventricle: The cavity size was moderately dilated. Wall   thickness was normal. - Right atrium: The atrium was mildly dilated. - Atrial septum: No defect or patent foramen ovale was identified. - Pulmonary arteries: PA peak pressure: 32 mm Hg (S).  Impressions:  - Atrial flutter with variable block and mild RVR during the entire   study.  Recent Labs: No results found for requested labs within last 8760 hours.    Lipid Panel No results found for: CHOL, TRIG, HDL, CHOLHDL, VLDL, LDLCALC, LDLDIRECT    Wt Readings from Last 3 Encounters:  09/12/17 166 lb 3.2 oz (75.4 kg)  06/26/17 167 lb 3.2 oz (75.8 kg)  05/23/17 167 lb 9.6 oz (76 kg)      ASSESSMENT AND PLAN:  # Persistent atrial fibrillation: Mr. Sherryll BurgerShah remains in atrial fibrillation.  His rate is well controlled. Continue metoprolol and Eliquis.  He is completely asympotmatic.  # Atypical chest pain:   Resolved.   ETT negative for ischemia.  # Hyperlipidemia: We will get a copy of his lipids from Dr. Allyne GeeSanders.    Current medicines are reviewed at length with the patient today.  The patient does not  have concerns regarding medicines.  The following changes have been made:  no change  Labs/ tests ordered today include:   No orders of the defined types were placed in this encounter.    Disposition:   FU with Emonnie Cannady C. Duke Salviaandolph, MD, [facility] in 6 months.      Signed, Kaleb Sek C. Duke Salviaandolph, MD, [facility]  09/12/2017 11:01 AM     [facility]

## 2017-09-24 ENCOUNTER — Encounter: Payer: Self-pay | Admitting: Cardiovascular Disease

## 2017-10-15 DIAGNOSIS — H5203 Hypermetropia, bilateral: Secondary | ICD-10-CM | POA: Diagnosis not present

## 2017-10-15 DIAGNOSIS — H40013 Open angle with borderline findings, low risk, bilateral: Secondary | ICD-10-CM | POA: Diagnosis not present

## 2017-10-15 DIAGNOSIS — H2513 Age-related nuclear cataract, bilateral: Secondary | ICD-10-CM | POA: Diagnosis not present

## 2017-11-12 ENCOUNTER — Ambulatory Visit: Payer: Medicare Other | Admitting: Internal Medicine

## 2017-11-12 ENCOUNTER — Encounter: Payer: Self-pay | Admitting: Internal Medicine

## 2017-11-12 VITALS — BP 110/68 | HR 77 | Temp 97.5°F | Ht 70.5 in | Wt 168.0 lb

## 2017-11-12 DIAGNOSIS — I480 Paroxysmal atrial fibrillation: Secondary | ICD-10-CM | POA: Diagnosis not present

## 2017-11-12 DIAGNOSIS — G4709 Other insomnia: Secondary | ICD-10-CM | POA: Diagnosis not present

## 2017-11-12 DIAGNOSIS — K219 Gastro-esophageal reflux disease without esophagitis: Secondary | ICD-10-CM | POA: Diagnosis not present

## 2017-11-12 NOTE — Progress Notes (Signed)
Subjective:     Patient ID: Carlos [name] , male    DOB: 1945-02-09 , 72 y.o.   MRN: [id number]   Gastroesophageal Reflux  He complains of abdominal pain, belching and heartburn. He reports no chest pain. This is a recurrent problem. The current episode started 1 to 4 weeks ago. The problem occurs frequently. The problem has been waxing and waning. The heartburn duration is an hour. The heartburn is of moderate intensity. The heartburn does not wake him from sleep.     Past Medical History:  Diagnosis Date  . Allergic rhinitis   . Atypical chest pain 09/16/2015  . Elevated blood pressure 10/19/2015  . Insomnia   . Mixed hyperlipidemia   . PVC (premature ventricular contraction) 10/19/2015      Current Outpatient Medications:  .  dexlansoprazole (DEXILANT) 60 MG capsule, Take 60 mg by mouth daily., Disp: , Rfl:  .  zolpidem (AMBIEN) 10 MG tablet, Take 10 mg by mouth at bedtime as needed for sleep., Disp: , Rfl:  .  apixaban (ELIQUIS) 5 MG TABS tablet, Take 1 tablet (5 mg total) by mouth 2 (two) times daily., Disp: 180 tablet, Rfl: 3 .  CALCIUM PO, Take 1 tablet by mouth daily. , Disp: , Rfl:  .  Cholecalciferol (VITAMIN D PO), Take 1 capsule by mouth daily. , Disp: , Rfl:  .  metoprolol tartrate (LOPRESSOR) 50 MG tablet, Take 1 tablet (50 mg total) by mouth 2 (two) times daily., Disp: 180 tablet, Rfl: 3 .  Multiple Vitamin (MULTIVITAMIN) capsule, Take 1 capsule by mouth daily., Disp: , Rfl:  .  Omega-3 Fatty Acids (FISH OIL PO), Take 1 capsule by mouth daily. , Disp: , Rfl:  .  raNITIdine HCl (ZANTAC PO), Take by mouth 2 (two) times daily. , Disp: , Rfl:    No Known Allergies   Review of Systems  Constitutional: Negative.  Negative for activity change.  HENT: Negative.   Eyes: Negative.   Respiratory: Negative.   Cardiovascular: Negative.  Negative for chest pain.  Gastrointestinal: Positive for abdominal pain and heartburn.       HE ALSO C/O INCREASED BELCHING.    Psychiatric/Behavioral: Negative.      Today's Vitals   11/12/17 1128  BP: 110/68  Pulse: 77  Temp: (!) 97.5 F (36.4 C)  TempSrc: Oral  Weight: 168 lb (76.2 kg)  Height: 5' 10.5" (1.791 m)   Body mass index is 23.76 kg/m.   Objective:  Physical Exam  Constitutional: He is oriented to person, place, and time. He appears well-developed and well-nourished.  HENT:  Head: Normocephalic and atraumatic.  Eyes: EOM are normal.  Neck: Normal range of motion.  Cardiovascular: Normal rate, regular rhythm and normal heart sounds.  Pulmonary/Chest: Effort normal and breath sounds normal.  Abdominal: Soft. Bowel sounds are normal. He exhibits no mass. There is no tenderness. There is no guarding.  Neurological: He is alert and oriented to person, place, and time.  Skin: Skin is warm and dry.  Psychiatric: He has a normal mood and affect.  Nursing note and vitals reviewed.       Assessment And Plan:     1. GERD without esophagitis  Chronic. Importance of dietary compliance was discussed with the patient. He is encouraged to cut back on intake of spicy foods. Also encouraged to stop eating 3 hours prior to going to bed. He was given samples of Glycate to take one capsule nightly. He will continue with Dexilant and Zantac  bid as per GI. She did not perform EGD after recent evaluation. I will refer him should his sx persist/worsen.   2. Other insomnia  The problem of recurrent insomnia is discussed. Avoidance of caffeine sources is strongly encouraged. Sleep hygiene issues are reviewed. The use of sedative hypnotics for temporary relief is appropriate; we discussed the addictive nature of these drugs. Greater than 50% of face to face time was spent in counseling and coordination of care.   3. PAF (paroxysmal atrial fibrillation) (HCC)   Chronic. He is encouraged to comply with medications. He is currently asymptomatic.      [name], MD

## 2017-11-12 NOTE — Patient Instructions (Signed)
Heartburn Heartburn is a type of pain or discomfort that can happen in the throat or chest. It is often described as a burning pain. It may also cause a bad taste in the mouth. Heartburn may feel worse when you lie down or bend over. It may be caused by stomach contents that move back up (reflux) into the tube that connects the mouth with the stomach (esophagus). Follow these instructions at home: Take these actions to lessen your discomfort and to help avoid problems. Diet  Follow a diet as told by your doctor. You may need to avoid foods and drinks such as: ? Coffee and tea (with or without caffeine). ? Drinks that contain alcohol. ? Energy drinks and sports drinks. ? Carbonated drinks or sodas. ? Chocolate and cocoa. ? Peppermint and mint flavorings. ? Garlic and onions. ? Horseradish. ? Spicy and acidic foods, such as peppers, chili powder, curry powder, vinegar, hot sauces, and BBQ sauce. ? Citrus fruit juices and citrus fruits, such as oranges, lemons, and limes. ? Tomato-based foods, such as red sauce, chili, salsa, and pizza with red sauce. ? Fried and fatty foods, such as donuts, french fries, potato chips, and high-fat dressings. ? High-fat meats, such as hot dogs, rib eye steak, sausage, ham, and bacon. ? High-fat dairy items, such as whole milk, butter, and cream cheese.  Eat small meals often. Avoid eating large meals.  Avoid drinking large amounts of liquid with your meals.  Avoid eating meals during the 2-3 hours before bedtime.  Avoid lying down right after you eat.  Do not exercise right after you eat. General instructions  Pay attention to any changes in your symptoms.  Take over-the-counter and prescription medicines only as told by your doctor. Do not take aspirin, ibuprofen, or other NSAIDs unless your doctor says it is okay.  Do not use any tobacco products, including cigarettes, chewing tobacco, and e-cigarettes. If you need help quitting, ask your  doctor.  Wear loose clothes. Do not wear anything tight around your waist.  Raise (elevate) the head of your bed about 6 inches (15 cm).  Try to lower your stress. If you need help doing this, ask your doctor.  If you are overweight, lose an amount of weight that is healthy for you. Ask your doctor about a safe weight loss goal.  Keep all follow-up visits as told by your doctor. This is important. Contact a doctor if:  You have new symptoms.  You lose weight and you do not know why it is happening.  You have trouble swallowing, or it hurts to swallow.  You have wheezing or a cough that keeps happening.  Your symptoms do not get better with treatment.  You have heartburn often for more than two weeks. Get help right away if:  You have pain in your arms, neck, jaw, teeth, or back.  You feel sweaty, dizzy, or light-headed.  You have chest pain or shortness of breath.  You throw up (vomit) and your throw up looks like blood or coffee grounds.  Your poop (stool) is bloody or black. This information is not intended to replace advice given to you by your health care provider. Make sure you discuss any questions you have with your health care provider. Document Released: 09/20/2010 Document Revised: 06/16/2015 Document Reviewed: 05/05/2014 Elsevier Interactive Patient Education  2018 [employer]  

## 2017-11-20 ENCOUNTER — Other Ambulatory Visit: Payer: Self-pay | Admitting: Internal Medicine

## 2017-11-21 NOTE — Telephone Encounter (Signed)
Zolpidem refill

## 2017-12-25 ENCOUNTER — Telehealth: Payer: Self-pay | Admitting: Cardiovascular Disease

## 2017-12-25 NOTE — Telephone Encounter (Signed)
Samples placed up front, patient notified 

## 2017-12-25 NOTE — Telephone Encounter (Signed)
New Message ° ° °Patient calling the office for samples of medication: ° ° °1.  What medication and dosage are you requesting samples for?apixaban (ELIQUIS) 5 MG TABS tablet  ° ° °2.  Are you currently out of this medication? 2 days remaining  ° ° ° °

## 2018-01-02 ENCOUNTER — Ambulatory Visit: Payer: Medicare Other | Admitting: Internal Medicine

## 2018-01-28 ENCOUNTER — Ambulatory Visit: Payer: Medicare Other | Admitting: Internal Medicine

## 2018-01-28 ENCOUNTER — Encounter: Payer: Self-pay | Admitting: Internal Medicine

## 2018-01-28 VITALS — BP 116/74 | HR 90 | Temp 97.5°F | Ht 70.5 in | Wt 166.8 lb

## 2018-01-28 DIAGNOSIS — Z23 Encounter for immunization: Secondary | ICD-10-CM | POA: Diagnosis not present

## 2018-01-28 DIAGNOSIS — F5104 Psychophysiologic insomnia: Secondary | ICD-10-CM

## 2018-01-28 DIAGNOSIS — K219 Gastro-esophageal reflux disease without esophagitis: Secondary | ICD-10-CM | POA: Diagnosis not present

## 2018-01-28 MED ORDER — TETANUS-DIPHTH-ACELL PERTUSSIS 5-2-15.5 LF-MCG/0.5 IM SUSP: 0.5000 mL | Freq: Once | INTRAMUSCULAR | 0 refills | Status: AC

## 2018-01-28 NOTE — Patient Instructions (Signed)

## 2018-02-04 NOTE — Progress Notes (Signed)
Subjective:     Patient ID: Carlos [name] , male    DOB: [birth year] , 73 y.o.   MRN: [id number]   Chief Complaint  Patient presents with  . Abdominal Pain    HPI  He is here today to discuss concerns regarding occasional abdominal discomfort/reflux. He is also followed by GI. Initially, he was prescribed Dexilant by myself, which was changed to pepcid by his gastroenterologist. He has noticed that his sx have returned while on the pepcid. He wants to know what to do next. He admits that he has cut back on his intake of spicy foods.   Abdominal Pain  The pain is located in the epigastric region. The pain is at a severity of 6/10. The pain is moderate. The quality of the pain is aching and burning. Associated symptoms include belching.     Past Medical History:  Diagnosis Date  . Allergic rhinitis   . Atypical chest pain 09/16/2015  . Elevated blood pressure 10/19/2015  . Insomnia   . Mixed hyperlipidemia   . PVC (premature ventricular contraction) 10/19/2015     Family History  Problem Relation Age of Onset  . Healthy Mother   . Heart attack Father   . Pulmonary embolism Brother      Current Outpatient Medications:  .  apixaban (ELIQUIS) 5 MG TABS tablet, Take 1 tablet (5 mg total) by mouth 2 (two) times daily., Disp: 180 tablet, Rfl: 3 .  CALCIUM PO, Take 1 tablet by mouth daily. , Disp: , Rfl:  .  Cholecalciferol (VITAMIN D PO), Take 1 capsule by mouth daily. , Disp: , Rfl:  .  MAGNESIUM PO, Take by mouth., Disp: , Rfl:  .  metoprolol tartrate (LOPRESSOR) 50 MG tablet, Take 1 tablet (50 mg total) by mouth 2 (two) times daily., Disp: 180 tablet, Rfl: 3 .  Multiple Vitamin (MULTIVITAMIN) capsule, Take 1 capsule by mouth daily., Disp: , Rfl:  .  Omega-3 Fatty Acids (FISH OIL PO), Take 1 capsule by mouth daily. , Disp: , Rfl:  .  raNITIdine HCl (ZANTAC PO), Take by mouth 2 (two) times daily. , Disp: , Rfl:  .  zolpidem (AMBIEN) 10 MG tablet, TAKE 1 TABLET BY MOUTH AT BEDTIME  AS NEEDED, Disp: 30 tablet, Rfl: 2   No Known Allergies   Review of Systems  Constitutional: Negative.   Respiratory: Negative.   Cardiovascular: Negative.   Gastrointestinal: Positive for abdominal pain.  Genitourinary: Negative.   Neurological: Negative.   Psychiatric/Behavioral: Negative.      Today's Vitals   01/28/18 1446  BP: 116/74  Pulse: 90  Temp: (!) 97.5 F (36.4 C)  TempSrc: Oral  Weight: 166 lb 12.8 oz (75.7 kg)  Height: 5' 10.5" (1.791 m)  PainSc: 0-No pain   Body mass index is 23.6 kg/m.   Objective:  Physical Exam Vitals signs and nursing note reviewed.  Constitutional:      Appearance: He is well-developed.  Abdominal:     General: Abdomen is flat. Bowel sounds are normal.     Palpations: Abdomen is rigid.     Tenderness: There is abdominal tenderness in the epigastric area.  Skin:    General: Skin is warm.  Neurological:     General: No focal deficit present.     Mental Status: He is alert.  Psychiatric:        Mood and Affect: Mood normal.         Assessment And Plan:     1.  Gastroesophageal reflux disease without esophagitis  Chronic. We discussed foods to avoid in reflux. He is encouraged to try Dexilant for two weeks to calm down his symptoms. Of note, he did belch several times during his exam. I did refer him to GI last year for possible EGD due to recurrence of his symptoms. However, he states GI did not feel this was necessary. He is encouraged to contact GI since his sx are recurrent. I spent more than 50% of face to face time in counseling and coordination of care. All questions were answered to his satisfaction.   2. Chronic insomnia  Chronic. He will continue with ambien prn. Pt advised of risk of oversedation is increased with continued use of this medication in his age group. He has spoken with his brother, who is also an MD, who states it is okay for him to continue with this medication. He is encouraged to adopt a bedtime  regimen to help his body wind down to prepare for slumber. He is unwilling to try alternative therapies. Initially, he expressed an interest in seeing a sleep specialist. However, he no longer wants to see anyone. I suspect this is at the suggestion of his family members.     3. Need for Tdap vaccination  A prescription for Tdap was sent to the pharmacy. He is encouraged to obtain documentation from pharmacy confirming administration of Tdap.   - Hepatitis C antibody        Gwynneth Alimentobyn N Jyra Lagares, MD

## 2018-02-25 ENCOUNTER — Other Ambulatory Visit: Payer: Self-pay | Admitting: Internal Medicine

## 2018-02-26 NOTE — Telephone Encounter (Signed)
Zolpidem refill

## 2018-03-27 ENCOUNTER — Ambulatory Visit: Payer: Medicare Other | Admitting: Cardiovascular Disease

## 2018-04-07 ENCOUNTER — Other Ambulatory Visit: Payer: Self-pay | Admitting: Internal Medicine

## 2018-04-07 ENCOUNTER — Telehealth: Payer: Self-pay

## 2018-04-07 NOTE — Telephone Encounter (Signed)
[place] APPROVED FOR ZOLPIDEM PT AND PHARM NOTIFIED

## 2018-04-07 NOTE — Telephone Encounter (Signed)
Zolpidem refill

## 2018-04-29 ENCOUNTER — Encounter: Payer: Self-pay | Admitting: Internal Medicine

## 2018-04-30 ENCOUNTER — Encounter: Payer: Self-pay | Admitting: Internal Medicine

## 2018-05-06 ENCOUNTER — Encounter: Payer: Self-pay | Admitting: Internal Medicine

## 2018-06-02 ENCOUNTER — Telehealth: Payer: Self-pay

## 2018-06-02 NOTE — Telephone Encounter (Signed)
Verbal consent given for virtual visit  

## 2018-06-03 ENCOUNTER — Other Ambulatory Visit: Payer: Self-pay

## 2018-06-03 ENCOUNTER — Ambulatory Visit (INDEPENDENT_AMBULATORY_CARE_PROVIDER_SITE_OTHER): Payer: Medicare Other | Admitting: Nurse Practitioner

## 2018-06-03 ENCOUNTER — Encounter: Payer: Self-pay | Admitting: Nurse Practitioner

## 2018-06-03 VITALS — BP 118/81 | HR 95 | Wt 162.0 lb

## 2018-06-03 DIAGNOSIS — J301 Allergic rhinitis due to pollen: Secondary | ICD-10-CM | POA: Diagnosis not present

## 2018-06-03 DIAGNOSIS — R05 Cough: Secondary | ICD-10-CM | POA: Diagnosis not present

## 2018-06-03 DIAGNOSIS — R059 Cough, unspecified: Secondary | ICD-10-CM

## 2018-06-03 NOTE — Progress Notes (Signed)
Virtual Visit via Video ([email or web address])   This visit type was conducted due to national recommendations for restrictions regarding the COVID-19 Pandemic (e.g. social distancing) in an effort to limit this patient's exposure and mitigate transmission in our community.  Patients identity confirmed using two different identifiers.  This format is felt to be most appropriate for this patient at this time.  All issues noted in this document were discussed and addressed.  No physical exam was performed (except for noted visual exam findings with Video Visits).    Date:  06/03/2018   ID:  Carlos [name], DOB Jun 27, 1945, MRN [id number]  Patient Location:  Home - spoke with Donnie Coffin  Provider location:   Office    Chief Complaint: Cough   History of Present Illness:    Carlos [name] is a 73 y.o. male who presents via video conferencing for a telehealth visit today.    The patient does not have symptoms concerning for COVID-19 infection (fever, chills, cough, or new shortness of breath).   97.7 worse in the morning which clears up after lunch.  Has taken hall's cough drops.  Use nasal spray and levocetirizine daily.  For the last several weeks has not taken due to not needing.  No wheezing regularly.    Cough  This is a recurrent problem. The current episode started in the past 7 days. The cough is productive of sputum. Pertinent negatives include no chest pain, chills or fever.     Past Medical History:  Diagnosis Date  . Allergic rhinitis   . Atypical chest pain 09/16/2015  . Elevated blood pressure 10/19/2015  . Insomnia   . Mixed hyperlipidemia   . PVC (premature ventricular contraction) 10/19/2015   Past Surgical History:  Procedure Laterality Date  . ASD REPAIR  1994  . INGUINAL HERNIA REPAIR  1994     Current Meds  Medication Sig  . apixaban (ELIQUIS) 5 MG TABS tablet Take 1 tablet (5 mg total) by mouth 2 (two) times daily.  Marland Kitchen CALCIUM PO Take 1 tablet by mouth daily.    . Cholecalciferol (VITAMIN D PO) Take 1 capsule by mouth daily.   Marland Kitchen MAGNESIUM PO Take by mouth.  . metoprolol tartrate (LOPRESSOR) 50 MG tablet Take 1 tablet (50 mg total) by mouth 2 (two) times daily.  . Multiple Vitamin (MULTIVITAMIN) capsule Take 1 capsule by mouth daily.  . Omega-3 Fatty Acids (FISH OIL PO) Take 1 capsule by mouth daily.   . raNITIdine HCl (ZANTAC PO) Take by mouth as needed.   . zolpidem (AMBIEN) 10 MG tablet TAKE 1 TABLET BY MOUTH AT BEDTIME AS NEEDED     Allergies:   Patient has no known allergies.   Social History   Tobacco Use  . Smoking status: Former Smoker    Years: 9.00    Last attempt to quit: 01/22/2006    Years since quitting: 12.3  . Smokeless tobacco: Never Used  Substance Use Topics  . Alcohol use: Yes    Alcohol/week: 21.0 standard drinks    Types: 21 Standard drinks or equivalent per week    Comment: 2-3 glasses per day  . Drug use: No     Family Hx: The patient's family history includes Healthy in his mother; Heart attack in his father; Pulmonary embolism in his brother.  ROS:   Please see the history of present illness.    Review of Systems  Constitutional: Negative for chills and fever.  Respiratory: Positive for cough.   Cardiovascular:  Negative for chest pain.    All other systems reviewed and are negative.   Labs/Other Tests and Data Reviewed:    Recent Labs: No results found for requested labs within last 8760 hours.   Recent Lipid Panel No results found for: CHOL, TRIG, HDL, CHOLHDL, LDLCALC, LDLDIRECT  Wt Readings from Last 3 Encounters:  06/03/18 162 lb (73.5 kg)  01/28/18 166 lb 12.8 oz (75.7 kg)  11/12/17 168 lb (76.2 kg)     Exam:    Vital Signs:  BP 118/81 (BP Location: Left Arm, Patient Position: Sitting, Cuff Size: Normal)   Pulse 95   Wt 162 lb (73.5 kg)   BMI 22.92 kg/m     Physical Exam  Constitutional: He is oriented to person, place, and time and well-developed, well-nourished, and in no  distress.  Pulmonary/Chest: Effort normal.  He does have a noted productive cough during video call  Neurological: He is alert and oriented to person, place, and time.  Psychiatric: Mood, memory, affect and judgment normal.    ASSESSMENT & PLAN:    1. Cough  Advised to use his levocetirizine and nasal spray at night if not better   Cough is likely related to post nasal drainage as improves as the day improves  2. Seasonal allergic rhinitis due to pollen  Continue with antihistamine and nasal spray and avoid triggers   COVID-19 Education: The signs and symptoms of COVID-19 were discussed with the patient and how to seek care for testing (follow up with PCP or arrange E-visit).  The importance of social distancing was discussed today.  Patient Risk:   After full review of this patients clinical status, I feel that they are at least moderate risk at this time.  Time:   Today, I have spent 15 minutes/ seconds with the patient with telehealth technology discussing above diagnoses.     Medication Adjustments/Labs and Tests Ordered: Current medicines are reviewed at length with the patient today.  Concerns regarding medicines are outlined above.   Tests Ordered: No orders of the defined types were placed in this encounter.   Medication Changes: No orders of the defined types were placed in this encounter.   Disposition:  Follow up prn  Signed, Arnette FeltsJanece Ellawyn Wogan, FNP

## 2018-06-06 ENCOUNTER — Encounter: Payer: Self-pay | Admitting: Nurse Practitioner

## 2018-06-10 ENCOUNTER — Other Ambulatory Visit: Payer: Self-pay | Admitting: Nurse Practitioner

## 2018-06-10 DIAGNOSIS — R05 Cough: Secondary | ICD-10-CM

## 2018-06-10 DIAGNOSIS — R059 Cough, unspecified: Secondary | ICD-10-CM

## 2018-06-10 MED ORDER — HYDROCODONE-HOMATROPINE 5-1.5 MG/5ML PO SYRP: 5.0000 mL | ORAL_SOLUTION | Freq: Four times a day (QID) | ORAL | 0 refills | Status: DC | PRN

## 2018-06-17 ENCOUNTER — Telehealth: Payer: Self-pay | Admitting: Cardiovascular Disease

## 2018-06-17 NOTE — Telephone Encounter (Signed)
Left message to call back to discuss  No other option at this time

## 2018-06-17 NOTE — Telephone Encounter (Signed)
New Message     Pt is returning call and said he doesn't want a virtual visit and would like to keep his office visit appt   Please call

## 2018-06-18 ENCOUNTER — Telehealth: Payer: Self-pay | Admitting: Cardiovascular Disease

## 2018-06-18 NOTE — Telephone Encounter (Signed)
Left message to call back  

## 2018-06-18 NOTE — Telephone Encounter (Signed)
LVM for pre reg and to inform Patient of virual visit

## 2018-06-18 NOTE — Telephone Encounter (Signed)
° ° °  Patient declined virtual visit. Explained to patient his options. He was agreeable to reschedule 9/10

## 2018-06-18 NOTE — Telephone Encounter (Signed)
Patient returned your call.

## 2018-06-19 ENCOUNTER — Ambulatory Visit: Payer: Medicare Other | Admitting: Cardiovascular Disease

## 2018-06-19 ENCOUNTER — Encounter: Payer: Self-pay | Admitting: Nurse Practitioner

## 2018-06-24 ENCOUNTER — Emergency Department (HOSPITAL_COMMUNITY): Payer: Medicare Other

## 2018-06-24 ENCOUNTER — Emergency Department (HOSPITAL_COMMUNITY)
Admission: EM | Admit: 2018-06-24 | Discharge: 2018-06-24 | Disposition: A | Discharge status: Home / Self Care | Payer: Medicare Other | Attending: Emergency Medicine | Admitting: Emergency Medicine

## 2018-06-24 ENCOUNTER — Other Ambulatory Visit: Payer: Self-pay

## 2018-06-24 ENCOUNTER — Encounter (HOSPITAL_COMMUNITY): Payer: Self-pay | Admitting: Emergency Medicine

## 2018-06-24 DIAGNOSIS — Z7901 Long term (current) use of anticoagulants: Secondary | ICD-10-CM | POA: Diagnosis not present

## 2018-06-24 DIAGNOSIS — R05 Cough: Secondary | ICD-10-CM

## 2018-06-24 DIAGNOSIS — J410 Simple chronic bronchitis: Secondary | ICD-10-CM

## 2018-06-24 DIAGNOSIS — Z87891 Personal history of nicotine dependence: Secondary | ICD-10-CM | POA: Insufficient documentation

## 2018-06-24 DIAGNOSIS — Z79899 Other long term (current) drug therapy: Secondary | ICD-10-CM | POA: Insufficient documentation

## 2018-06-24 DIAGNOSIS — R059 Cough, unspecified: Secondary | ICD-10-CM

## 2018-06-24 MED ORDER — FLUTICASONE PROPIONATE 50 MCG/ACT NA SUSP: 1.0000 | Freq: Every day | NASAL | 2 refills | Status: DC

## 2018-06-24 MED ORDER — HYDROCODONE-HOMATROPINE 5-1.5 MG/5ML PO SYRP: 5.0000 mL | ORAL_SOLUTION | Freq: Four times a day (QID) | ORAL | 0 refills | Status: DC | PRN

## 2018-06-24 NOTE — ED Provider Notes (Signed)
[facility] Provider Note   CSN: [id number] Arrival date & time: 06/24/18  1913    History   Chief Complaint Chief Complaint  Patient presents with  . Cough    HPI Carlos [name] is a 73 y.o. male.     HPI Pt has been coughing for several weeks.  He went to his doctor and was given a cough medication.  It helped somewhat but then today he started coughing a lot.  It is a dry cough.  No fevers, no bodyaches, no shortness of breath.  No diaphoresis.  No sneezing.  Mild rhinitis.  Pt states he feels fine except for the cough.  Since he has been in the ED though the coughing has decreased.    Past Medical History:  Diagnosis Date  . Allergic rhinitis   . Atypical chest pain 09/16/2015  . Elevated blood pressure 10/19/2015  . Insomnia   . Mixed hyperlipidemia   . PVC (premature ventricular contraction) 10/19/2015    Patient Active Problem List   Diagnosis Date Noted  . PAF (paroxysmal atrial fibrillation) (HCC) 06/26/2017  . Anticoagulated 06/26/2017  . PVC (premature ventricular contraction) 10/19/2015  . Elevated blood pressure 10/19/2015  . Atypical chest pain 09/16/2015  . Hyperreflexia of lower extremity 02/09/2015  . RLS (restless legs syndrome) 02/09/2015  . PLMD (periodic limb movement disorder) 02/09/2015    Past Surgical History:  Procedure Laterality Date  . ASD REPAIR  1994  . INGUINAL HERNIA REPAIR  1994        Home Medications    Prior to Admission medications   Medication Sig Start Date End Date Taking? Authorizing Provider  apixaban (ELIQUIS) 5 MG TABS tablet Take 1 tablet (5 mg total) by mouth 2 (two) times daily. 09/12/17   Chilton Siandolph, Tiffany, MD  CALCIUM PO Take 1 tablet by mouth daily.     [provider]  Cholecalciferol (VITAMIN D PO) Take 1 capsule by mouth daily.     [provider]  fluticasone (FLONASE) 50 MCG/ACT nasal spray Place 1 spray into both nostrils daily. 06/24/18   Linwood DibblesKnapp, Anayeli Arel,  MD  HYDROcodone-homatropine (HYDROMET) 5-1.5 MG/5ML syrup Take 5 mLs by mouth every 6 (six) hours as needed. 06/24/18   Linwood DibblesKnapp, Jakhari Space, MD  MAGNESIUM PO Take by mouth.    [provider]  metoprolol tartrate (LOPRESSOR) 50 MG tablet Take 1 tablet (50 mg total) by mouth 2 (two) times daily. 09/12/17 06/03/18  Chilton Siandolph, Tiffany, MD  Multiple Vitamin (MULTIVITAMIN) capsule Take 1 capsule by mouth daily.    [provider]  Omega-3 Fatty Acids (FISH OIL PO) Take 1 capsule by mouth daily.     [provider]  raNITIdine HCl (ZANTAC PO) Take by mouth as needed.     [provider]  zolpidem (AMBIEN) 10 MG tablet TAKE 1 TABLET BY MOUTH AT BEDTIME AS NEEDED 04/08/18   Dorothyann PengSanders, Robyn, MD    Family History Family History  Problem Relation Age of Onset  . Healthy Mother   . Heart attack Father   . Pulmonary embolism Brother     Social History Social History   Tobacco Use  . Smoking status: Former Smoker    Years: 9.00    Last attempt to quit: 01/22/2006    Years since quitting: 12.4  . Smokeless tobacco: Never Used  Substance Use Topics  . Alcohol use: Yes    Alcohol/week: 21.0 standard drinks    Types: 21 Standard drinks or equivalent per  week    Comment: 2-3 glasses per day  . Drug use: No     Allergies   Patient has no known allergies.   Review of Systems Review of Systems  All other systems reviewed and are negative.    Physical Exam Updated Vital Signs BP 130/76   Pulse 73   Temp 97.6 F (36.4 C) (Oral)   Resp 15   Ht 1.778 m ( )   Wt 73.9 kg   SpO2 97%   BMI 23.39 kg/m   Physical Exam Vitals signs and nursing note reviewed.  Constitutional:      General: He is not in acute distress.    Appearance: He is well-developed.  HENT:     Head: Normocephalic and atraumatic.     Right Ear: External ear normal.     Left Ear: External ear normal.  Eyes:     General: No scleral icterus.       Right eye: No discharge.        Left eye:  No discharge.     Conjunctiva/sclera: Conjunctivae normal.  Neck:     Musculoskeletal: Neck supple.     Trachea: No tracheal deviation.  Cardiovascular:     Rate and Rhythm: Normal rate and regular rhythm.  Pulmonary:     Effort: Pulmonary effort is normal. No respiratory distress.     Breath sounds: Normal breath sounds. No stridor. No wheezing or rales.  Abdominal:     General: Bowel sounds are normal. There is no distension.     Palpations: Abdomen is soft.     Tenderness: There is no abdominal tenderness. There is no guarding or rebound.  Musculoskeletal:        General: No swelling, tenderness or deformity.  Skin:    General: Skin is warm and dry.     Findings: No rash.  Neurological:     Mental Status: He is alert.     Cranial Nerves: Cranial nerve deficit: no gross deficits.     Sensory: No sensory deficit.     Motor: No abnormal muscle tone or seizure activity.     Coordination: Coordination normal.      ED Treatments / Results  Labs (all labs ordered are listed, but only abnormal results are displayed) Labs Reviewed - No data to display  EKG None  Radiology Dg Chest 2 View  Result Date: 06/24/2018 CLINICAL DATA:  Cough for the past 3 weeks.  Ex-smoker. EXAM: CHEST - 2 VIEW COMPARISON:  Thoracic spine radiographs dated 03/12/2015. FINDINGS: Borderline and enlarged cardiac silhouette. Hyperexpanded lungs with mild diffuse peribronchial thickening and accentuation of the interstitial markings. Prominent central pulmonary arteries. Small amount of bilateral pleural thickening or fluid. Thoracic spine degenerative changes. IMPRESSION: 1. Borderline cardiomegaly and mild changes of COPD and chronic bronchitis. 2. Small amount of bilateral pleural thickening or fluid. Electronically Signed   By: Beckie Salts M.D.   On: 06/24/2018 20:46    Procedures Procedures (including critical care time)  Medications Ordered in ED Medications - No data to display   Initial  Impression / Assessment and Plan / ED Course  I have reviewed the triage vital signs and the nursing notes.  Pertinent labs & imaging results that were available during my care of the patient were reviewed by me and considered in my medical decision making (see chart for details).      Patient's chest x-ray shows findings suggestive of mild COPD and chronic bronchitis.  Patient has had a  cough for several weeks now.  This may be an allergic trigger.  I will discharge him home on a course of Flonase and also given a refill of his cough medication.  Patient understands to follow-up with his primary care doctor.  Final Clinical Impressions(s) / ED Diagnoses   Final diagnoses:  Simple chronic bronchitis [facility])    ED Discharge Orders         Ordered    HYDROcodone-homatropine (HYDROMET) 5-1.5 MG/5ML syrup  Every 6 hours PRN     06/24/18 2119    fluticasone (FLONASE) 50 MCG/ACT nasal spray  Daily     06/24/18 2119           Linwood Dibbles, MD 06/24/18 2120

## 2018-06-24 NOTE — Discharge Instructions (Addendum)
Medications as prescribed, follow-up with your primary care doctor °

## 2018-06-24 NOTE — ED Triage Notes (Signed)
Pt reports cough onset x3 weeks denies any other symptoms. Cough + product clear. Seen by PMD  Arnette Felts   06/10/18 given Hydromet cough syrup that effective pt has run out and the cough has returned

## 2018-06-30 ENCOUNTER — Other Ambulatory Visit: Payer: Self-pay | Admitting: Internal Medicine

## 2018-06-30 ENCOUNTER — Other Ambulatory Visit: Payer: Self-pay | Admitting: Nurse Practitioner

## 2018-06-30 MED ORDER — BENZONATATE 100 MG PO CAPS: 100.0000 mg | ORAL_CAPSULE | Freq: Four times a day (QID) | ORAL | 1 refills | Status: DC | PRN

## 2018-07-01 ENCOUNTER — Other Ambulatory Visit: Payer: Self-pay | Admitting: Internal Medicine

## 2018-07-01 ENCOUNTER — Encounter: Payer: Self-pay | Admitting: Internal Medicine

## 2018-07-01 MED ORDER — ZOLPIDEM TARTRATE 10 MG PO TABS: 10.0000 mg | ORAL_TABLET | Freq: Every evening | ORAL | 2 refills | Status: DC | PRN

## 2018-07-10 ENCOUNTER — Other Ambulatory Visit: Payer: Self-pay | Admitting: Internal Medicine

## 2018-07-14 ENCOUNTER — Encounter: Payer: Self-pay | Admitting: Internal Medicine

## 2018-07-14 ENCOUNTER — Ambulatory Visit (INDEPENDENT_AMBULATORY_CARE_PROVIDER_SITE_OTHER): Payer: Medicare Other | Admitting: Internal Medicine

## 2018-07-14 ENCOUNTER — Other Ambulatory Visit: Payer: Self-pay

## 2018-07-14 VITALS — BP 114/68 | HR 93 | Temp 97.8°F | Ht 68.0 in | Wt 165.0 lb

## 2018-07-14 DIAGNOSIS — R059 Cough, unspecified: Secondary | ICD-10-CM

## 2018-07-14 DIAGNOSIS — R05 Cough: Secondary | ICD-10-CM

## 2018-07-14 DIAGNOSIS — R1909 Other intra-abdominal and pelvic swelling, mass and lump: Secondary | ICD-10-CM | POA: Diagnosis not present

## 2018-07-14 NOTE — Patient Instructions (Signed)
Hernia, Adult ° °  ° °A hernia happens when tissue inside your body pushes out through a weak spot in your belly muscles (abdominal wall). This makes a round lump (bulge). The lump may be: °· In a scar from surgery that was done in your belly (incisional hernia). °· Near your belly button (umbilical hernia). °· In your groin (inguinal hernia). Your groin is the area where your leg meets your lower belly (abdomen). This kind of hernia could also be: °? In your scrotum, if you are male. °? In folds of skin around your vagina, if you are male. °· In your upper thigh (femoral hernia). °· Inside your belly (hiatal hernia). This happens when your stomach slides above the muscle between your belly and your chest (diaphragm). °If your hernia is small and it does not cause pain, you may not need treatment. If your hernia is large or it causes pain, you may need surgery. °Follow these instructions at home: °Activity °· Avoid stretching or overusing (straining) the muscles near your hernia. Straining can happen when you: °? Lift something heavy. °? Poop (have a bowel movement). °· Do not lift anything that is heavier than 10 lb (4.5 kg), or the limit that you are told, until your doctor says that it is safe. °· Use the strength of your legs when you lift something heavy. Do not use only your back muscles to lift. °General instructions °· Do these things if told by your doctor so you do not have trouble pooping (constipation): °? Drink enough fluid to keep your pee (urine) pale yellow. °? Eat foods that are high in fiber. These include fresh fruits and vegetables, whole grains, and beans. °? Limit foods that are high in fat and processed sugars. These include foods that are fried or sweet. °? Take medicine for trouble pooping. °· When you cough, try to cough gently. °· You may try to push your hernia in by very gently pressing on it when you are lying down. Do not try to force the bulge back in if it will not push in  easily. °· If you are overweight, work with your doctor to lose weight safely. °· Do not use any products that have nicotine or tobacco in them. These include cigarettes and e-cigarettes. If you need help quitting, ask your doctor. °· If you will be having surgery (hernia repair), watch your hernia for changes in shape, size, or color. Tell your doctor if you see any changes. °· Take over-the-counter and prescription medicines only as told by your doctor. °· Keep all follow-up visits as told by your doctor. °Contact a doctor if: °· You get new pain, swelling, or redness near your hernia. °· You poop fewer times in a week than normal. °· You have trouble pooping. °· You have poop (stool) that is more dry than normal. °· You have poop that is harder or larger than normal. °Get help right away if: °· You have a fever. °· You have belly pain that gets worse. °· You feel sick to your stomach (nauseous). °· You throw up (vomit). °· Your hernia cannot be pushed in by very gently pressing on it when you are lying down. Do not try to force the bulge back in if it will not push in easily. °· Your hernia: °? Changes in shape or size. °? Changes color. °? Feels hard or it hurts when you touch it. °These symptoms may represent a serious problem that is an emergency. Do not   wait to see if the symptoms will go away. Get medical help right away. Call your local emergency services (911 in the [place] °Summary °· A hernia happens when tissue inside your body pushes out through a weak spot in the belly muscles. This creates a bulge. °· If your hernia is small and it does not hurt, you may not need treatment. If your hernia is large or it hurts, you may need surgery. °· If you will be having surgery, watch your hernia for changes in shape, size, or color. Tell your doctor about any changes. °This information is not intended to replace advice given to you by your health care provider. Make sure you discuss any questions you have with  your health care provider. °Document Released: 06/28/2009 Document Revised: 10/10/2016 Document Reviewed: 10/10/2016 °Elsevier Interactive Patient Education © 2019 [employer] ° °

## 2018-07-15 ENCOUNTER — Other Ambulatory Visit: Payer: Medicare Other

## 2018-07-15 ENCOUNTER — Telehealth: Payer: Self-pay

## 2018-07-15 DIAGNOSIS — Z20822 Contact with and (suspected) exposure to covid-19: Secondary | ICD-10-CM

## 2018-07-15 NOTE — Addendum Note (Signed)
Addended by: Denyce Robert on: 07/15/2018 09:24 AM   Modules accepted: Orders

## 2018-07-15 NOTE — Telephone Encounter (Signed)
Good morning,  Dr. Baird Cancer would like the patient to be contacted and scheduled to have a coronavirus test.  Thank you.

## 2018-07-15 NOTE — Telephone Encounter (Signed)
Scheduled patient for COVID 19 testing today at [facility] at 1:00 pm.  Testing protocol reviewed with patient, he expressed understanding.

## 2018-07-17 ENCOUNTER — Telehealth: Payer: Self-pay

## 2018-07-17 NOTE — Telephone Encounter (Signed)
Error

## 2018-07-19 LAB — NOVEL CORONAVIRUS, NAA: SARS-CoV-2, NAA: NOT DETECTED

## 2018-07-20 NOTE — Progress Notes (Signed)
Subjective:     Patient ID: Carlos [name] , male    DOB: Oct 06, 1945 , 73 y.o.   MRN: [id number]   Chief Complaint  Patient presents with  . Cough    HPI  Cough This is a recurrent problem. The current episode started 1 to 4 weeks ago. The problem has been unchanged. The problem occurs every few minutes. The cough is non-productive. Associated symptoms include postnasal drip. Pertinent negatives include no chest pain, chills, ear pain, fever or nasal congestion. The symptoms are aggravated by lying down. He has tried nothing for the symptoms.     Past Medical History:  Diagnosis Date  . Allergic rhinitis   . Atypical chest pain 09/16/2015  . Elevated blood pressure 10/19/2015  . Insomnia   . Mixed hyperlipidemia   . PVC (premature ventricular contraction) 10/19/2015     Family History  Problem Relation Age of Onset  . Healthy Mother   . Heart attack Father   . Pulmonary embolism Brother      Current Outpatient Medications:  .  apixaban (ELIQUIS) 5 MG TABS tablet, Take 1 tablet (5 mg total) by mouth 2 (two) times daily., Disp: 180 tablet, Rfl: 3 .  benzonatate (TESSALON PERLES) 100 MG capsule, Take 1 capsule (100 mg total) by mouth every 6 (six) hours as needed for cough., Disp: 30 capsule, Rfl: 1 .  CALCIUM PO, Take 1 tablet by mouth daily. , Disp: , Rfl:  .  Cholecalciferol (VITAMIN D PO), Take 1 capsule by mouth daily. , Disp: , Rfl:  .  fluticasone (FLONASE) 50 MCG/ACT nasal spray, Place 1 spray into both nostrils daily., Disp: 16 g, Rfl: 2 .  MAGNESIUM PO, Take by mouth., Disp: , Rfl:  .  metoprolol tartrate (LOPRESSOR) 50 MG tablet, Take 1 tablet (50 mg total) by mouth 2 (two) times daily., Disp: 180 tablet, Rfl: 3 .  Multiple Vitamin (MULTIVITAMIN) capsule, Take 1 capsule by mouth daily., Disp: , Rfl:  .  Omega-3 Fatty Acids (FISH OIL PO), Take 1 capsule by mouth daily. , Disp: , Rfl:  .  raNITIdine HCl (ZANTAC PO), Take by mouth as needed. , Disp: , Rfl:  .   zolpidem (AMBIEN) 10 MG tablet, Take 1 tablet (10 mg total) by mouth at bedtime as needed., Disp: 30 tablet, Rfl: 2 .  HYDROcodone-homatropine (HYDROMET) 5-1.5 MG/5ML syrup, Take 5 mLs by mouth every 6 (six) hours as needed. (Patient not taking: Reported on 07/14/2018), Disp: 120 mL, Rfl: 0   No Known Allergies   Review of Systems  Constitutional: Negative for chills and fever.  HENT: Positive for postnasal drip. Negative for ear pain.   Respiratory: Positive for cough.   Cardiovascular: Negative for chest pain.     Today's Vitals   07/14/18 1453  BP: 114/68  Pulse: 93  Temp: 97.8 F (36.6 C)  TempSrc: Oral  SpO2: 97%  Weight: 165 lb (74.8 kg)  Height: 5\' 8"  (1.727 m)  PainSc: 0-No pain   Body mass index is 25.09 kg/m.   Objective:  Physical Exam Vitals signs and nursing note reviewed.  Constitutional:      Appearance: Normal appearance.  HENT:     Right Ear: Tympanic membrane, ear canal and external ear normal. There is no impacted cerumen.     Left Ear: Tympanic membrane, ear canal and external ear normal. There is no impacted cerumen.  Cardiovascular:     Rate and Rhythm: Normal rate and regular rhythm.  Heart sounds: Normal heart sounds.  Pulmonary:     Effort: Pulmonary effort is normal.     Breath sounds: Normal breath sounds.  Abdominal:     General: Abdomen is flat. Bowel sounds are normal.     Palpations: Abdomen is soft.  Genitourinary:      Comments: There is a bulge in left groin. It is sl. Tender to palpation. No overlying erythema. Skin:    General: Skin is warm.  Neurological:     General: No focal deficit present.     Mental Status: He is alert.  Psychiatric:        Mood and Affect: Mood normal.         Assessment And Plan:     1. Cough  Chronic. He has chronic GERD and h/o PND. His sx have been persistent. I will refer him to ENT for further evaluation. . Additionally, I will refer him for COVID-19 testing per his request. He denies  known exposure to COVID-19 positive patient.   - Ambulatory referral to ENT  2. Groin lump  This is likely an inguinal hernia.  Per his request, I will refer him for surgical consultation.  Pt advised CT is needed, but he wants to go to surgeon first.   - Ambulatory referral to General Surgery        Gwynneth Alimentobyn N Jaylie Neaves, MD    THE PATIENT IS ENCOURAGED TO PRACTICE SOCIAL DISTANCING DUE TO THE COVID-19 PANDEMIC.

## 2018-07-24 ENCOUNTER — Encounter: Payer: Self-pay | Admitting: Internal Medicine

## 2018-07-24 ENCOUNTER — Ambulatory Visit (INDEPENDENT_AMBULATORY_CARE_PROVIDER_SITE_OTHER): Payer: Medicare Other | Admitting: Internal Medicine

## 2018-07-24 ENCOUNTER — Ambulatory Visit: Payer: Medicare Other | Admitting: Internal Medicine

## 2018-07-24 ENCOUNTER — Ambulatory Visit (INDEPENDENT_AMBULATORY_CARE_PROVIDER_SITE_OTHER): Payer: Medicare Other

## 2018-07-24 ENCOUNTER — Other Ambulatory Visit: Payer: Self-pay

## 2018-07-24 ENCOUNTER — Ambulatory Visit: Payer: Medicare Other

## 2018-07-24 VITALS — BP 116/70 | HR 87 | Temp 97.9°F | Ht 70.0 in | Wt 162.2 lb

## 2018-07-24 VITALS — BP 112/70 | HR 87 | Temp 97.9°F | Ht 70.0 in | Wt 162.2 lb

## 2018-07-24 DIAGNOSIS — Z1211 Encounter for screening for malignant neoplasm of colon: Secondary | ICD-10-CM

## 2018-07-24 DIAGNOSIS — G4709 Other insomnia: Secondary | ICD-10-CM | POA: Diagnosis not present

## 2018-07-24 DIAGNOSIS — Z0000 Encounter for general adult medical examination without abnormal findings: Secondary | ICD-10-CM | POA: Diagnosis not present

## 2018-07-24 DIAGNOSIS — K219 Gastro-esophageal reflux disease without esophagitis: Secondary | ICD-10-CM | POA: Diagnosis not present

## 2018-07-24 DIAGNOSIS — I480 Paroxysmal atrial fibrillation: Secondary | ICD-10-CM | POA: Diagnosis not present

## 2018-07-24 DIAGNOSIS — R351 Nocturia: Secondary | ICD-10-CM

## 2018-07-24 DIAGNOSIS — Z79899 Other long term (current) drug therapy: Secondary | ICD-10-CM

## 2018-07-24 DIAGNOSIS — R1909 Other intra-abdominal and pelvic swelling, mass and lump: Secondary | ICD-10-CM

## 2018-07-24 LAB — POCT URINALYSIS DIPSTICK
Bilirubin, UA: NEGATIVE
Blood, UA: NEGATIVE
Glucose, UA: NEGATIVE
Ketones, UA: NEGATIVE
Leukocytes, UA: NEGATIVE
Nitrite, UA: NEGATIVE
Protein, UA: NEGATIVE
Spec Grav, UA: 1.025 (ref 1.010–1.025)
Urobilinogen, UA: 0.2 E.U./dL
pH, UA: 6.5 (ref 5.0–8.0)

## 2018-07-24 MED ORDER — BOOSTRIX 5-2.5-18.5 LF-MCG/0.5 IM SUSP: 0.5000 mL | Freq: Once | INTRAMUSCULAR | 0 refills | Status: DC

## 2018-07-24 MED ORDER — DAYVIGO 5 MG PO TABS: 5.0000 mg | ORAL_TABLET | Freq: Every evening | ORAL | 0 refills | Status: DC | PRN

## 2018-07-24 NOTE — Patient Instructions (Signed)

## 2018-07-24 NOTE — Progress Notes (Signed)
Subjective:   Carlos [name] is a 73 y.o. male who presents for Medicare Annual/Subsequent preventive examination.  Review of Systems:  n/a Cardiac Risk Factors include: advanced age (>60men, >48 women);dyslipidemia     Objective:    Vitals: BP 112/70 (Patient Position: Sitting)   Pulse 87   Temp 97.9 F (36.6 C) (Oral)   Ht 5\' 10"  (1.778 m)   Wt 162 lb 3.2 oz (73.6 kg)   BMI 23.27 kg/m   Body mass index is 23.27 kg/m.  Advanced Directives 07/24/2018 06/24/2018  Does Patient Have a Medical Advance Directive? Yes Yes  Type of Paramedic of [place];Living will [place] in Chart? No - copy requested No - copy requested    Tobacco Social History   Tobacco Use  Smoking Status Former Smoker  . Packs/day: 0.50  . Years: 9.00  . Pack years: 4.50  . Types: Cigarettes  . Quit date: 01/22/2006  . 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 Allergic rhinitis   . Atypical chest pain 09/16/2015  . Elevated blood pressure 10/19/2015  . Insomnia   . Mixed hyperlipidemia   . PVC (premature ventricular contraction) 10/19/2015   Past Surgical History:  Procedure Laterality Date  . ASD REPAIR  1994  . INGUINAL HERNIA REPAIR  1994   Family History  Problem Relation Age of Onset  . Healthy Mother   . Heart attack Father   . Pulmonary  embolism Brother    Social History   Socioeconomic History  . Marital status: Divorced    Spouse name: Not on file  . Number of children: Not on file  . Years of education: Not on file  . Highest education level: Not on file  Occupational History  . Occupation: retired  Scientific laboratory technician  . Financial resource strain: Not hard at all  . Food insecurity    Worry: Never true    Inability: Never true  . Transportation needs    Medical: No    Non-medical: No  Tobacco Use  . Smoking status: Former Smoker    Packs/day: 0.50    Years: 9.00    Pack years: 4.50    Types: Cigarettes    Quit date: 01/22/2006    Years since quitting: 12.5  . Smokeless tobacco: Never Used  Substance and Sexual Activity  . Alcohol use: Yes    Alcohol/week: 1.0 standard drinks    Types: 1 Glasses of wine per week    Comment: social  . Drug use: No  . Sexual activity: Not Currently  Lifestyle  . Physical activity    Days per week: 7 days    Minutes per session: 60 min  . Stress: Not at all  Relationships  . Social Herbalist on phone: Not on file    Gets together: Not on  file    Attends religious service: Not on file    Active member of club or organization: Not on file    Attends meetings of clubs or organizations: Not on file    Relationship status: Not on file  Other Topics Concern  . Not on file  Social History Narrative   Drinks 1-2 cups of caffeine daily.    Outpatient Encounter Medications as of 07/24/2018  Medication Sig  . apixaban (ELIQUIS) 5 MG TABS tablet Take 1 tablet (5 mg total) by mouth 2 (two) times daily.  . benzonatate (TESSALON PERLES) 100 MG capsule Take 1 capsule (100 mg total) by mouth every 6 (six) hours as needed for cough.  Marland Kitchen. CALCIUM PO Take 1 tablet by mouth daily.   . Cholecalciferol (VITAMIN D PO) Take 1 capsule by mouth daily.   . fluticasone (FLONASE) 50 MCG/ACT nasal spray Place 1 spray into both nostrils daily.  Marland Kitchen. HYDROcodone-homatropine (HYDROMET) 5-1.5  MG/5ML syrup Take 5 mLs by mouth every 6 (six) hours as needed.  Marland Kitchen. MAGNESIUM PO Take by mouth.  . metoprolol tartrate (LOPRESSOR) 50 MG tablet Take 1 tablet (50 mg total) by mouth 2 (two) times daily.  . Multiple Vitamin (MULTIVITAMIN) capsule Take 1 capsule by mouth daily.  . Omega-3 Fatty Acids (FISH OIL PO) Take 1 capsule by mouth daily.   . raNITIdine HCl (ZANTAC PO) Take by mouth as needed.   . zolpidem (AMBIEN) 10 MG tablet Take 1 tablet (10 mg total) by mouth at bedtime as needed.   No facility-administered encounter medications on file as of 07/24/2018.     Activities of Daily Living In your present state of health, do you have any difficulty performing the following activities: 07/24/2018  Hearing? N  Vision? N  Difficulty concentrating or making decisions? N  Walking or climbing stairs? N  Dressing or bathing? N  Doing errands, shopping? N  Preparing Food and eating ? N  Using the Toilet? N  In the past six months, have you accidently leaked urine? N  Do you have problems with loss of bowel control? N  Managing your Medications? N  Managing your Finances? N  Housekeeping or managing your Housekeeping? N  Some recent data might be hidden    Patient Care Team: Dorothyann PengSanders, Robyn, MD as PCP - General (Internal Medicine) Chilton Siandolph, Tiffany, MD as PCP - Cardiology (Cardiology)   Assessment:   This is a routine wellness examination for Carlos [name].  Exercise Activities and Dietary recommendations Current Exercise Habits: Home exercise routine, Type of exercise: walking, Time (Minutes): 60, Frequency (Times/Week): 7, Weekly Exercise (Minutes/Week): 420  Goals    . Patient Stated     No goals       Fall Risk Fall Risk  07/24/2018 07/24/2018 07/14/2018 06/03/2018 01/28/2018  Falls in the past year? 0 0 0 0 0  Risk for fall due to : Medication side effect - - - -  Follow up Falls evaluation completed;Education provided;Falls prevention discussed - - - -   Is the patient's home free  of loose throw rugs in walkways, pet beds, electrical cords, etc?   yes      Grab bars in the bathroom? no      Handrails on the stairs?   yes      Adequate lighting?   yes  Timed Get Up and Go Performed: n/a  Depression Screen PHQ 2/9 Scores 07/24/2018 07/14/2018 06/03/2018 01/28/2018  PHQ - 2 Score 0 0 0 0  Cognitive Function     6CIT Screen 07/24/2018  What Year? 0 points  What month? 0 points  What time? 0 points  Count back from 20 0 points  Months in reverse 0 points  Repeat phrase 0 points  Total Score 0     There is no immunization history on file for this patient.  Qualifies for Shingles Vaccine? yes  Screening Tests Health Maintenance  Topic Date Due  . TETANUS/TDAP  11/17/1964  . INFLUENZA VACCINE  08/23/2018  . COLONOSCOPY  09/28/2024  . Hepatitis C Screening  Completed  . PNA vac Low Risk Adult  Completed   Cancer Screenings: Lung: Low Dose CT Chest recommended if Age 60-80 years, 30 pack-year currently smoking OR have quit w/in 15years. Patient does not qualify. Colorectal: up to date  Additional Screenings:  Hepatitis C Screening:01/28/2018      Plan:    6 CIT was 0. No goals set.  I have personally reviewed and noted the following in the patient's chart:   . Medical and social history . Use of alcohol, tobacco or illicit drugs  . Current medications and supplements . Functional ability and status . Nutritional status . Physical activity . Advanced directives . List of other physicians . Hospitalizations, surgeries, and ER visits in previous 12 months . Vitals . Screenings to include cognitive, depression, and falls . Referrals and appointments  In addition, I have reviewed and discussed with patient certain preventive protocols, quality metrics, and best practice recommendations. A written personalized care plan for preventive services as well as general preventive health recommendations were provided to patient.     Barb Merinoickeah E Allen, LPN   1/6/10967/02/2018

## 2018-07-24 NOTE — Patient Instructions (Signed)
Carlos [name] , Thank you for taking time to come for your Medicare Wellness Visit. I appreciate your ongoing commitment to your health goals. Please review the following plan we discussed and let me know if I can assist you in the future.   Screening recommendations/referrals: Colonoscopy: 09/2014 Recommended yearly ophthalmology/optometry visit for glaucoma screening and checkup Recommended yearly dental visit for hygiene and checkup  Vaccinations: Influenza vaccine: decline Pneumococcal vaccine: 09/2011 Tdap vaccine: 01/2018 Shingles vaccine: discussed    Advanced directives: Please bring a copy of your POA (Power of [place]) and/or Living Will to your next appointment.    Conditions/risks identified: none  Next appointment: 01/29/2019 at 9:30  Preventive Care 65 Years and Older, Male Preventive care refers to lifestyle choices and visits with your health care provider that can promote health and wellness. What does preventive care include?  A yearly physical exam. This is also called an annual well check.  Dental exams once or twice a year.  Routine eye exams. Ask your health care provider how often you should have your eyes checked.  Personal lifestyle choices, including:  Daily care of your teeth and gums.  Regular physical activity.  Eating a healthy diet.  Avoiding tobacco and drug use.  Limiting alcohol use.  Practicing safe sex.  Taking low doses of aspirin every day.  Taking vitamin and mineral supplements as recommended by your health care provider. What happens during an annual well check? The services and screenings done by your health care provider during your annual well check will depend on your age, overall health, lifestyle risk factors, and family history of disease. Counseling  Your health care provider may ask you questions about your:  Alcohol use.  Tobacco use.  Drug use.  Emotional well-being.  Home and relationship well-being.  Sexual  activity.  Eating habits.  History of falls.  Memory and ability to understand (cognition).  Work and work Statistician. Screening  You may have the following tests or measurements:  Height, weight, and BMI.  Blood pressure.  Lipid and cholesterol levels. These may be checked every 5 years, or more frequently if you are over 15 years old.  Skin check.  Lung cancer screening. You may have this screening every year starting at age 42 if you have a 30-pack-year history of smoking and currently smoke or have quit within the past 15 years.  Fecal occult blood test (FOBT) of the stool. You may have this test every year starting at age 32.  Flexible sigmoidoscopy or colonoscopy. You may have a sigmoidoscopy every 5 years or a colonoscopy every 10 years starting at age 61.  Prostate cancer screening. Recommendations will vary depending on your family history and other risks.  Hepatitis C blood test.  Hepatitis B blood test.  Sexually transmitted disease (STD) testing.  Diabetes screening. This is done by checking your blood sugar (glucose) after you have not eaten for a while (fasting). You may have this done every 1-3 years.  Abdominal aortic aneurysm (AAA) screening. You may need this if you are a current or former smoker.  Osteoporosis. You may be screened starting at age 72 if you are at high risk. Talk with your health care provider about your test results, treatment options, and if necessary, the need for more tests. Vaccines  Your health care provider may recommend certain vaccines, such as:  Influenza vaccine. This is recommended every year.  Tetanus, diphtheria, and acellular pertussis (Tdap, Td) vaccine. You may need a Td booster every  10 years.  Zoster vaccine. You may need this after age 61.  Pneumococcal 13-valent conjugate (PCV13) vaccine. One dose is recommended after age 66.  Pneumococcal polysaccharide (PPSV23) vaccine. One dose is recommended after age 88.  Talk to your health care provider about which screenings and vaccines you need and how often you need them. This information is not intended to replace advice given to you by your health care provider. Make sure you discuss any questions you have with your health care provider. Document Released: 02/04/2015 Document Revised: 09/28/2015 Document Reviewed: 11/09/2014 Elsevier Interactive Patient Education  2017 [place] Prevention in the Home Falls can cause injuries. They can happen to people of all ages. There are many things you can do to make your home safe and to help prevent falls. What can I do on the outside of my home?  Regularly fix the edges of walkways and driveways and fix any cracks.  Remove anything that might make you trip as you walk through a door, such as a raised step or threshold.  Trim any bushes or trees on the path to your home.  Use bright outdoor lighting.  Clear any walking paths of anything that might make someone trip, such as rocks or tools.  Regularly check to see if handrails are loose or broken. Make sure that both sides of any steps have handrails.  Any raised decks and porches should have guardrails on the edges.  Have any leaves, snow, or ice cleared regularly.  Use sand or salt on walking paths during winter.  Clean up any spills in your garage right away. This includes oil or grease spills. What can I do in the bathroom?  Use night lights.  Install grab bars by the toilet and in the tub and shower. Do not use towel bars as grab bars.  Use non-skid mats or decals in the tub or shower.  If you need to sit down in the shower, use a plastic, non-slip stool.  Keep the floor dry. Clean up any water that spills on the floor as soon as it happens.  Remove soap buildup in the tub or shower regularly.  Attach bath mats securely with double-sided non-slip rug tape.  Do not have throw rugs and other things on the floor that can make you  trip. What can I do in the bedroom?  Use night lights.  Make sure that you have a light by your bed that is easy to reach.  Do not use any sheets or blankets that are too big for your bed. They should not hang down onto the floor.  Have a firm chair that has side arms. You can use this for support while you get dressed.  Do not have throw rugs and other things on the floor that can make you trip. What can I do in the kitchen?  Clean up any spills right away.  Avoid walking on wet floors.  Keep items that you use a lot in easy-to-reach places.  If you need to reach something above you, use a strong step stool that has a grab bar.  Keep electrical cords out of the way.  Do not use floor polish or wax that makes floors slippery. If you must use wax, use non-skid floor wax.  Do not have throw rugs and other things on the floor that can make you trip. What can I do with my stairs?  Do not leave any items on the stairs.  Make sure  that there are handrails on both sides of the stairs and use them. Fix handrails that are broken or loose. Make sure that handrails are as long as the stairways.  Check any carpeting to make sure that it is firmly attached to the stairs. Fix any carpet that is loose or worn.  Avoid having throw rugs at the top or bottom of the stairs. If you do have throw rugs, attach them to the floor with carpet tape.  Make sure that you have a light switch at the top of the stairs and the bottom of the stairs. If you do not have them, ask someone to add them for you. What else can I do to help prevent falls?  Wear shoes that:  Do not have high heels.  Have rubber bottoms.  Are comfortable and fit you well.  Are closed at the toe. Do not wear sandals.  If you use a stepladder:  Make sure that it is fully opened. Do not climb a closed stepladder.  Make sure that both sides of the stepladder are locked into place.  Ask someone to hold it for you, if  possible.  Clearly mark and make sure that you can see:  Any grab bars or handrails.  First and last steps.  Where the edge of each step is.  Use tools that help you move around (mobility aids) if they are needed. These include:  Canes.  Walkers.  Scooters.  Crutches.  Turn on the lights when you go into a dark area. Replace any light bulbs as soon as they burn out.  Set up your furniture so you have a clear path. Avoid moving your furniture around.  If any of your floors are uneven, fix them.  If there are any pets around you, be aware of where they are.  Review your medicines with your doctor. Some medicines can make you feel dizzy. This can increase your chance of falling. Ask your doctor what other things that you can do to help prevent falls. This information is not intended to replace advice given to you by your health care provider. Make sure you discuss any questions you have with your health care provider. Document Released: 11/04/2008 Document Revised: 06/16/2015 Document Reviewed: 02/12/2014 Elsevier Interactive Patient Education  2017 [employer].

## 2018-07-25 LAB — CMP14+EGFR
ALT: 18 IU/L (ref 0–44)
AST: 14 IU/L (ref 0–40)
Albumin/Globulin Ratio: 1.6 (ref 1.2–2.2)
Albumin: 4.4 g/dL (ref 3.7–4.7)
Alkaline Phosphatase: 106 IU/L (ref 39–117)
BUN/Creatinine Ratio: 15 (ref 10–24)
BUN: 16 mg/dL (ref 8–27)
Bilirubin Total: 0.5 mg/dL (ref 0.0–1.2)
CO2: 24 mmol/L (ref 20–29)
Calcium: 9.9 mg/dL (ref 8.6–10.2)
Chloride: 102 mmol/L (ref 96–106)
Creatinine, Ser: 1.04 mg/dL (ref 0.76–1.27)
GFR calc Af Amer: 83 mL/min/{1.73_m2} (ref >59)
GFR calc non Af Amer: 71 mL/min/{1.73_m2} (ref >59)
Globulin, Total: 2.8 g/dL (ref 1.5–4.5)
Glucose: 116 mg/dL — ABNORMAL HIGH (ref 65–99)
Potassium: 4.5 mmol/L (ref 3.5–5.2)
Sodium: 141 mmol/L (ref 134–144)
Total Protein: 7.2 g/dL (ref 6.0–8.5)

## 2018-07-25 LAB — CBC
Hematocrit: 47.2 % (ref 37.5–51.0)
Hemoglobin: 15.6 g/dL (ref 13.0–17.7)
MCH: 28.3 pg (ref 26.6–33.0)
MCHC: 33.1 g/dL (ref 31.5–35.7)
MCV: 86 fL (ref 79–97)
Platelets: 303 10*3/uL (ref 150–450)
RBC: 5.51 x10E6/uL (ref 4.14–5.80)
RDW: 12.8 % (ref 11.6–15.4)
WBC: 7.5 10*3/uL (ref 3.4–10.8)

## 2018-07-25 LAB — LIPID PANEL
Chol/HDL Ratio: 6.2 ratio — ABNORMAL HIGH (ref 0.0–5.0)
Cholesterol, Total: 216 mg/dL — ABNORMAL HIGH (ref 100–199)
HDL: 35 mg/dL — ABNORMAL LOW (ref >39)
LDL Calculated: 134 mg/dL — ABNORMAL HIGH (ref 0–99)
Triglycerides: 233 mg/dL — ABNORMAL HIGH (ref 0–149)
VLDL Cholesterol Cal: 47 mg/dL — ABNORMAL HIGH (ref 5–40)

## 2018-07-25 LAB — PSA: Prostate Specific Ag, Serum: 0.5 ng/mL (ref 0.0–4.0)

## 2018-07-25 LAB — SAR COV2 SEROLOGY (COVID19)AB(IGG),IA: SARS-CoV-2 Ab, IgG: NEGATIVE

## 2018-07-25 LAB — VITAMIN B12: Vitamin B-12: 729 pg/mL (ref 232–1245)

## 2018-07-26 ENCOUNTER — Encounter: Payer: Self-pay | Admitting: Internal Medicine

## 2018-07-26 ENCOUNTER — Encounter: Payer: Self-pay | Admitting: Nurse Practitioner

## 2018-07-26 LAB — POC HEMOCCULT BLD/STL (OFFICE/1-CARD/DIAGNOSTIC)
Card #1 Date: 7022020
Fecal Occult Blood, POC: NEGATIVE

## 2018-07-26 NOTE — Progress Notes (Signed)
Subjective:     Patient ID: Carlos [name] , male    DOB: 1945-06-16 , 73 y.o.   MRN: [id number]   Chief Complaint  Patient presents with  . Annual Exam    HPI  He is here today for a physical exam. He was recently tested for COVID-19. He is happy to state he is negative.    Past Medical History:  Diagnosis Date  . Allergic rhinitis   . Atypical chest pain 09/16/2015  . Elevated blood pressure 10/19/2015  . Insomnia   . Mixed hyperlipidemia   . PVC (premature ventricular contraction) 10/19/2015     Family History  Problem Relation Age of Onset  . Healthy Mother   . Heart attack Father   . Pulmonary embolism Brother      Current Outpatient Medications:  .  apixaban (ELIQUIS) 5 MG TABS tablet, Take 1 tablet (5 mg total) by mouth 2 (two) times daily., Disp: 180 tablet, Rfl: 3 .  benzonatate (TESSALON PERLES) 100 MG capsule, Take 1 capsule (100 mg total) by mouth every 6 (six) hours as needed for cough., Disp: 30 capsule, Rfl: 1 .  CALCIUM PO, Take 1 tablet by mouth daily. , Disp: , Rfl:  .  Cholecalciferol (VITAMIN D PO), Take 1 capsule by mouth daily. , Disp: , Rfl:  .  fluticasone (FLONASE) 50 MCG/ACT nasal spray, Place 1 spray into both nostrils daily., Disp: 16 g, Rfl: 2 .  HYDROcodone-homatropine (HYDROMET) 5-1.5 MG/5ML syrup, Take 5 mLs by mouth every 6 (six) hours as needed., Disp: 120 mL, Rfl: 0 .  MAGNESIUM PO, Take by mouth., Disp: , Rfl:  .  Multiple Vitamin (MULTIVITAMIN) capsule, Take 1 capsule by mouth daily., Disp: , Rfl:  .  Omega-3 Fatty Acids (FISH OIL PO), Take 1 capsule by mouth daily. , Disp: , Rfl:  .  raNITIdine HCl (ZANTAC PO), Take by mouth as needed. , Disp: , Rfl:  .  zolpidem (AMBIEN) 10 MG tablet, Take 1 tablet (10 mg total) by mouth at bedtime as needed., Disp: 30 tablet, Rfl: 2 .  Lemborexant (DAYVIGO) 5 MG TABS, Take 5 mg by mouth at bedtime as needed., Disp: 10 tablet, Rfl: 0 .  metoprolol tartrate (LOPRESSOR) 50 MG tablet, Take 1 tablet (50  mg total) by mouth 2 (two) times daily., Disp: 180 tablet, Rfl: 3   No Known Allergies   Men's preventive visit. Patient Health Questionnaire (PHQ-2) is    Clinical Support from 07/24/2018 in [facility]  PHQ-2 Total Score  0    . Patient is on a healthy diet. Marital status: Divorced. Relevant history for alcohol use is:  Social History   Substance and Sexual Activity  Alcohol Use Yes  . Alcohol/week: 1.0 standard drinks  . Types: 1 Glasses of wine per week   Comment: social  . Relevant history for tobacco use is:  Social History   Tobacco Use  Smoking Status Former Smoker  . Packs/day: 0.50  . Years: 9.00  . Pack years: 4.50  . Types: Cigarettes  . Quit date: 01/22/2006  . Years since quitting: 12.5  Smokeless Tobacco Never Used  .  Review of Systems  Constitutional: Negative.   HENT: Negative.   Eyes: Negative.   Respiratory: Negative.   Cardiovascular: Negative.   Gastrointestinal: Negative.   Endocrine: Negative.   Genitourinary: Negative.        He admits to occasional nocturia. Denies dysuria, urinary frequency.   Musculoskeletal: Negative.   Skin:  Negative.   Allergic/Immunologic: Negative.   Neurological: Negative.   Hematological: Negative.   Psychiatric/Behavioral: Negative.      Today's Vitals   07/24/18 0845  BP: 116/70  Pulse: 87  Temp: 97.9 F (36.6 C)  TempSrc: Oral  Weight: 162 lb 3.2 oz (73.6 kg)  Height: _0  (1.778 m)  PainSc: 0-No pain   Body mass index is 23.27 kg/m.   Objective:  Physical Exam Vitals signs and nursing note reviewed.  Constitutional:      Appearance: Normal appearance.  HENT:     Head: Normocephalic and atraumatic.     Right Ear: Tympanic membrane, ear canal and external ear normal.     Left Ear: Tympanic membrane, ear canal and external ear normal.     Nose: Nose normal.     Mouth/Throat:     Mouth: Mucous membranes are moist.     Pharynx: Oropharynx is clear.  Eyes:      Extraocular Movements: Extraocular movements intact.     Conjunctiva/sclera: Conjunctivae normal.     Pupils: Pupils are equal, round, and reactive to light.  Neck:     Musculoskeletal: Normal range of motion and neck supple.  Cardiovascular:     Rate and Rhythm: Normal rate and regular rhythm.     Pulses: Normal pulses.     Heart sounds: Normal heart sounds.  Pulmonary:     Effort: Pulmonary effort is normal.     Breath sounds: Normal breath sounds.  Chest:     Breasts:        Right: Normal. No swelling, bleeding, inverted nipple, mass or nipple discharge.        Left: Normal. No swelling, bleeding, inverted nipple, mass or nipple discharge.  Abdominal:     General: Abdomen is flat. Bowel sounds are normal.     Palpations: Abdomen is soft.  Genitourinary:    Prostate: Normal.     Rectum: Normal. Guaiac result negative.     Comments: Left groin fullness Musculoskeletal: Normal range of motion.  Skin:    General: Skin is warm.  Neurological:     General: No focal deficit present.     Mental Status: He is alert.  Psychiatric:        Mood and Affect: Mood normal.        Behavior: Behavior normal.         Assessment And Plan:     1. Routine general medical examination at health care facility  A full exam was performed.  DRE performed, stool heme negative.  PATIENT HAS BEEN ADVISED TO GET 30-45 MINUTES REGULAR EXERCISE NO LESS THAN FOUR TO FIVE DAYS PER WEEK - BOTH WEIGHTBEARING EXERCISES AND AEROBIC ARE RECOMMENDED.  HE IS ADVISED TO FOLLOW A HEALTHY DIET WITH AT LEAST SIX FRUITS/VEGGIES PER DAY, DECREASE INTAKE OF RED MEAT, AND TO INCREASE FISH INTAKE TO TWO DAYS PER WEEK.  MEATS/FISH SHOULD NOT BE FRIED, BAKED OR BROILED IS PREFERABLE.  I SUGGEST WEARING SPF 50 SUNSCREEN ON EXPOSED PARTS AND ESPECIALLY WHEN IN THE DIRECT SUNLIGHT FOR AN EXTENDED PERIOD OF TIME.  PLEASE AVOID FAST FOOD RESTAURANTS AND INCREASE YOUR WATER INTAKE.  - POC Hemoccult Bld/Stl (1-Cd Office Dx) - POCT  Urinalysis Dipstick (81002)  2. PAF (paroxysmal atrial fibrillation) (HCC)  Chronic, intermittent. He is currently asymptomatic, rate controlled, and anticoagulated. He is encouraged to continue with current meds. EKG shows afib w/ HR 76.   - Lipid panel - EKG 12-Lead  3. Gastroesophageal reflux disease  without esophagitis  Chronic. He is encouraged to avoid spicy foods and to stop eating 3 hours prior to going to bed.   4. Other insomnia  Chronic. He was given rx Dayvigo, 23m to use nightly as needed. He was also given voucher to relieve rx for free.   5. Groin lump  Unfortunately, he has yet to hear about CT scan. I will place this referral again. Pt agrees with surgical evaluation as well.   - CT Abdomen Pelvis Wo Contrast; Future  6. Nocturia  Intermittent. I will check PSA today.   - PSA  7. Drug therapy  - CMP14+EGFR - CBC - Vitamin B12 - SAR CoV2 Serology (COVID 19)AB(IGG)IA        RMaximino Greenland MD    THE PATIENT IS ENCOURAGED TO PRACTICE SOCIAL DISTANCING DUE TO THE COVID-19 PANDEMIC.

## 2018-08-01 ENCOUNTER — Emergency Department (HOSPITAL_COMMUNITY): Payer: Medicare Other

## 2018-08-01 ENCOUNTER — Other Ambulatory Visit: Payer: Self-pay

## 2018-08-01 ENCOUNTER — Encounter (HOSPITAL_COMMUNITY): Payer: Self-pay | Admitting: Emergency Medicine

## 2018-08-01 ENCOUNTER — Emergency Department (HOSPITAL_COMMUNITY)
Admission: EM | Admit: 2018-08-01 | Discharge: 2018-08-01 | Disposition: A | Discharge status: Home / Self Care | Payer: Medicare Other | Attending: Emergency Medicine | Admitting: Emergency Medicine

## 2018-08-01 DIAGNOSIS — R059 Cough, unspecified: Secondary | ICD-10-CM

## 2018-08-01 DIAGNOSIS — R05 Cough: Secondary | ICD-10-CM | POA: Diagnosis present

## 2018-08-01 DIAGNOSIS — Z87891 Personal history of nicotine dependence: Secondary | ICD-10-CM | POA: Insufficient documentation

## 2018-08-01 DIAGNOSIS — Z7901 Long term (current) use of anticoagulants: Secondary | ICD-10-CM | POA: Insufficient documentation

## 2018-08-01 DIAGNOSIS — Z79899 Other long term (current) drug therapy: Secondary | ICD-10-CM | POA: Diagnosis not present

## 2018-08-01 MED ORDER — BENZONATATE 100 MG PO CAPS: 100.0000 mg | ORAL_CAPSULE | Freq: Three times a day (TID) | ORAL | 0 refills | Status: DC

## 2018-08-01 MED ORDER — HYDROCODONE-HOMATROPINE 5-1.5 MG/5ML PO SYRP: 5.0000 mL | ORAL_SOLUTION | Freq: Four times a day (QID) | ORAL | 0 refills | Status: DC | PRN

## 2018-08-01 NOTE — Discharge Instructions (Signed)
A repeat chest xray today appears normal. No evidence of pneumonia.  Please take cough medications as prescribed.  Follow up with your doctor for further care.

## 2018-08-01 NOTE — ED Provider Notes (Signed)
[facility] DEPT Provider Note   CSN: [id number] Arrival date & time: 08/01/18  1145     History   Chief Complaint Chief Complaint  Patient presents with  . Cough    HPI Carlos [name] is a 73 y.o. male.     The history is provided by the patient. No language interpreter was used.  Cough    73 year old male with history of paroxysmal atrial fibrillation currently on Eliquis, allergic rhinitis, hyperlipidemia, presenting for evaluation of a cough.  Patient report for the past 2 months he has had recurrent nonproductive cough.  Cough happens sporadically throughout the day.  Cough is not associate with any fever, chills, body aches, headache, chest pain, shortness of breath, congestion, myalgia, loss of taste or smell.  No environmental changes, no medication changes.  He has been compliant with his medication.  He mention been seen a month ago in the ED for his complaint at that time had a chest x-ray that was unremarkable.  Patient was given cough medication.  He follow-up with his primary care doctor 2 weeks later and had a COVID testing done that was negative.  He received additional cough medication.  He mention cough still persist and after discussing with his PCP office, patient presenting to ED for further evaluation.  Past Medical History:  Diagnosis Date  . Allergic rhinitis   . Atypical chest pain 09/16/2015  . Elevated blood pressure 10/19/2015  . Insomnia   . Mixed hyperlipidemia   . PVC (premature ventricular contraction) 10/19/2015    Patient Active Problem List   Diagnosis Date Noted  . PAF (paroxysmal atrial fibrillation) ([place]) 06/26/2017  . Anticoagulated 06/26/2017  . PVC (premature ventricular contraction) 10/19/2015  . Elevated blood pressure 10/19/2015  . Atypical chest pain 09/16/2015  . Hyperreflexia of lower extremity 02/09/2015  . RLS (restless legs syndrome) 02/09/2015  . PLMD (periodic limb movement disorder)  02/09/2015    Past Surgical History:  Procedure Laterality Date  . ASD REPAIR  1994  . INGUINAL HERNIA REPAIR  1994        Home Medications    Prior to Admission medications   Medication Sig Start Date End Date Taking? Authorizing Provider  apixaban (ELIQUIS) 5 MG TABS tablet Take 1 tablet (5 mg total) by mouth 2 (two) times daily. 09/12/17   Skeet Latch, MD  benzonatate (TESSALON PERLES) 100 MG capsule Take 1 capsule (100 mg total) by mouth every 6 (six) hours as needed for cough. 06/30/18 06/30/19  Minette Brine, FNP  CALCIUM PO Take 1 tablet by mouth daily.     [provider]  Cholecalciferol (VITAMIN D PO) Take 1 capsule by mouth daily.     [provider]  fluticasone (FLONASE) 50 MCG/ACT nasal spray Place 1 spray into both nostrils daily. 06/24/18   Dorie Rank, MD  HYDROcodone-homatropine (HYDROMET) 5-1.5 MG/5ML syrup Take 5 mLs by mouth every 6 (six) hours as needed. 06/24/18   Dorie Rank, MD  Lemborexant (DAYVIGO) 5 MG TABS Take 5 mg by mouth at bedtime as needed. 07/24/18   Glendale Chard, MD  MAGNESIUM PO Take by mouth.    [provider]  metoprolol tartrate (LOPRESSOR) 50 MG tablet Take 1 tablet (50 mg total) by mouth 2 (two) times daily. 09/12/17 07/14/18  Skeet Latch, MD  Multiple Vitamin (MULTIVITAMIN) capsule Take 1 capsule by mouth daily.    [provider]  Omega-3 Fatty Acids (FISH OIL PO) Take 1 capsule by mouth daily.  [provider]  raNITIdine HCl (ZANTAC PO) Take by mouth as needed.     [provider]  zolpidem (AMBIEN) 10 MG tablet Take 1 tablet (10 mg total) by mouth at bedtime as needed. 07/01/18   Dorothyann PengSanders, Robyn, MD    Family History Family History  Problem Relation Age of Onset  . Healthy Mother   . Heart attack Father   . Pulmonary embolism Brother     Social History Social History   Tobacco Use  . Smoking status: Former Smoker    Packs/day: 0.50    Years: 9.00    Pack years: 4.50     Types: Cigarettes    Quit date: 01/22/2006    Years since quitting: 12.5  . Smokeless tobacco: Never Used  Substance Use Topics  . Alcohol use: Yes    Alcohol/week: 1.0 standard drinks    Types: 1 Glasses of wine per week    Comment: social  . Drug use: No     Allergies   Patient has no known allergies.   Review of Systems Review of Systems  Respiratory: Positive for cough.   All other systems reviewed and are negative.    Physical Exam Updated Vital Signs BP (!) 134/92 (BP Location: Right Arm)   Pulse 91   Temp 98 F (36.7 C) (Oral)   Resp 17   SpO2 98%   Physical Exam Vitals signs and nursing note reviewed.  Constitutional:      General: He is not in acute distress.    Appearance: He is well-developed.  HENT:     Head: Atraumatic.     Nose: Nose normal.     Mouth/Throat:     Mouth: Mucous membranes are moist.  Eyes:     Conjunctiva/sclera: Conjunctivae normal.  Neck:     Musculoskeletal: Neck supple.  Cardiovascular:     Rate and Rhythm: Normal rate and regular rhythm.     Pulses: Normal pulses.     Heart sounds: Normal heart sounds.  Pulmonary:     Effort: Pulmonary effort is normal.     Breath sounds: Normal breath sounds. No wheezing, rhonchi or rales.  Abdominal:     Palpations: Abdomen is soft.     Tenderness: There is no abdominal tenderness.  Skin:    Findings: No rash.  Neurological:     Mental Status: He is alert and oriented to person, place, and time.  Psychiatric:        Mood and Affect: Mood normal.      ED Treatments / Results  Labs (all labs ordered are listed, but only abnormal results are displayed) Labs Reviewed - No data to display  EKG None  Radiology Dg Chest 2 View  Result Date: 08/01/2018 CLINICAL DATA:  Cough EXAM: CHEST - 2 VIEW COMPARISON:  June 24, 2018 FINDINGS: The heart size and mediastinal contours are stable. Both lungs are clear. The visualized skeletal structures are stable. IMPRESSION: No active  cardiopulmonary disease. Electronically Signed   By: Sherian ReinWei-Chen  Lin M.D.   On: 08/01/2018 14:07    Procedures Procedures (including critical care time)  Medications Ordered in ED Medications - No data to display   Initial Impression / Assessment and Plan / ED Course  I have reviewed the triage vital signs and the nursing notes.  Pertinent labs & imaging results that were available during my care of the patient were reviewed by me and considered in my medical decision making (see chart for details).  BP (!) 134/92 (BP Location: Right Arm)   Pulse 91   Temp 98 F (36.7 C) (Oral)   Resp 17   SpO2 98%    Final Clinical Impressions(s) / ED Diagnoses   Final diagnoses:  Cough    ED Discharge Orders         Ordered    benzonatate (TESSALON) 100 MG capsule  Every 8 hours     08/01/18 1427    HYDROcodone-homatropine (HYDROMET) 5-1.5 MG/5ML syrup  Every 6 hours PRN     08/01/18 1427         12:47 PM Patient has had a recurrent nonproductive cough for the past 2 months.  He has been seen and evaluated for this condition several times recently.  States he has had negative chest x-ray, as while as negative cover testing test 10 days ago.  He does not have any complaints of shortness of breath or fever or other finding to suggest active COVID-19.  He is well-appearing, his speaking without labored breath.  His lungs are clear.  Vital signs stable.  I did discuss options of repeat COVID testing and chest x-ray however after discussion we felt that the tests are low yield.  He does not meet criteria for admission even if he tested positive for COVID-19.  I will provide cough medication and encourage patient to follow-up with PCP for further care.  Return precaution discussed.  Care discussed with Dr. Freida BusmanAllen.   Carlos [name] was evaluated in Emergency Department on 08/01/2018 for the symptoms described in the history of present illness. He was evaluated in the context of the global  COVID-19 pandemic, which necessitated consideration that the patient might be at risk for infection with the SARS-CoV-2 virus that causes COVID-19. Institutional protocols and algorithms that pertain to the evaluation of patients at risk for COVID-19 are in a state of rapid change based on information released by regulatory bodies including the CDC and federal and state organizations. These policies and algorithms were followed during the patient's care in the ED.    Fayrene Helperran, Jaskaran Dauzat, PA-C 08/01/18 1428    Lorre NickAllen, Anthony, MD 08/06/18 [phone]

## 2018-08-01 NOTE — ED Notes (Signed)
Patient transported to X-ray 

## 2018-08-01 NOTE — ED Notes (Signed)
ED Provider at bedside. 

## 2018-08-01 NOTE — ED Provider Notes (Signed)
Medical screening examination/treatment/procedure(s) were conducted as a shared visit with non-physician practitioner(s) and myself.  I personally evaluated the patient during the encounter.    73 year old male presents after having episode of coughing today.  States he feels better at this time.  Lungs are clear.  Will check chest x-ray and likely discharge   Lacretia Leigh, MD 08/01/18 1318

## 2018-08-01 NOTE — ED Notes (Signed)
Patient given water

## 2018-08-01 NOTE — ED Triage Notes (Signed)
Patient reports cough x2 months. Seen for same x1 month ago. Dx with bronchitis. Sent for COVID test. Denies fever, body ache, chest pain, SOB. Ambulatory.

## 2018-08-04 ENCOUNTER — Telehealth: Payer: Self-pay

## 2018-08-04 ENCOUNTER — Ambulatory Visit: Payer: Self-pay | Admitting: Surgery

## 2018-08-04 NOTE — Telephone Encounter (Signed)
Pharm please address eliquis thanks 

## 2018-08-04 NOTE — Telephone Encounter (Signed)
   Primary [place], MD  Chart reviewed as part of pre-operative protocol coverage. Because of Borden Cork's past medical history and time since last visit, he/she will require a follow-up visit in order to better assess preoperative cardiovascular risk.  Pre-op covering staff: - Please schedule appointment and call patient to inform them. - Please contact requesting surgeon's office via preferred method (i.e, phone, fax) to inform them of need for appointment prior to surgery.  If applicable, this message will also be routed to pharmacy pool and/or primary cardiologist for input on holding anticoagulant/antiplatelet agent as requested below so that this information is available at time of patient's appointment.   Cecilie Kicks, NP  08/04/2018, 4:41 PM

## 2018-08-04 NOTE — Telephone Encounter (Signed)
   [facility] Pre-operative Risk Assessment    Request for surgical clearance:  1. What type of surgery is being performed? Hernia Surgery   2. When is this surgery scheduled? TBD   3. What type of clearance is required (medical clearance vs. Pharmacy clearance to hold med vs. Both)? Both  4. Are there any medications that need to be held prior to surgery and how long? Eliquis ( may require lovenox bridge)   5. Practice name and name of physician performing surgery?  [facility]   6. What is your office phone number [phone]    7.   What is your office fax number [phone] Attn: Emeline Gins  8.   Anesthesia type (None, local, MAC, general) ?  LMA and Tap Block   Meryl Crutch 08/04/2018, 4:29 PM  _________________________________________________________________   (provider comments below)

## 2018-08-04 NOTE — H&P (Signed)
History of Present Illness Carlos [name]. Carlos Deis MD; 08/04/2018 11:15 AM) The patient is a 73 year old male who presents with an inguinal hernia. Referred by Dr. Glendale Chard for left inguinal hernia  This is a 73 year old male with atrial fibrillation on [place] who presents with a six-week history of chronic cough. He has undergone several treatment regimens for this cough but it does seem to persist. About 4 weeks ago he began noticing some swelling and discomfort in his left groin. He has had a previous right inguinal hernia repair. Since he noticed a bulge in his left groin, he has noticed more difficulty with bowel movements. He denies any nausea or vomiting. He presents now to discuss management of the new left inguinal hernia.   Problem List/Past Medical Carlos Key K. Irma Delancey, MD; 08/04/2018 11:16 AM) INGUINAL HERNIA OF LEFT SIDE WITHOUT OBSTRUCTION OR GANGRENE (K40.90)  Past Surgical History Emeline Gins, [place]; 08/04/2018 10:44 AM) Open Inguinal Hernia Surgery Right. Oral Surgery Vasectomy  Diagnostic Studies History Emeline Gins, [place]; 08/04/2018 10:44 AM) Colonoscopy 1-5 years ago  Allergies Emeline Gins, CMA; 08/04/2018 10:45 AM) No Known Drug Allergies [08/04/2018]: Allergies Reconciled  Medication History Emeline Gins, CMA; 08/04/2018 10:46 AM) Eliquis (5MG  Tablet, Oral) Active. Metoprolol Succinate (25MG  CP24 Sprinkle, Oral) Active. Zolpidem Tartrate (5MG  Tablet, Oral) Active. Medications Reconciled  Social History Emeline Gins, [place]; 08/04/2018 10:44 AM) Alcohol use Moderate alcohol use. Caffeine use Carbonated beverages, Coffee, Tea. No drug use Tobacco use Former smoker.  Family History Emeline Gins, [place]; 08/04/2018 10:44 AM) Heart Disease Father.  Other Problems Carlos [name]. Serayah Yazdani, MD; 08/04/2018 11:16 AM) Atrial Fibrillation Gastroesophageal Reflux Disease Inguinal Hernia     Review of Systems Emeline Gins CMA;  08/04/2018 10:44 AM) General Not Present- Appetite Loss, Chills, Fatigue, Fever, Night Sweats, Weight Gain and Weight Loss. Skin Not Present- Change in Wart/Mole, Dryness, Hives, Jaundice, New Lesions, Non-Healing Wounds, Rash and Ulcer. HEENT Present- Seasonal Allergies and Wears glasses/contact lenses. Not Present- Earache, Hearing Loss, Hoarseness, Nose Bleed, Oral Ulcers, Ringing in the Ears, Sinus Pain, Sore Throat, Visual Disturbances and Yellow Eyes. Respiratory Not Present- Bloody sputum, Chronic Cough, Difficulty Breathing, Snoring and Wheezing. Cardiovascular Not Present- Chest Pain, Difficulty Breathing Lying Down, Leg Cramps, Palpitations, Rapid Heart Rate, Shortness of Breath and Swelling of Extremities. Gastrointestinal Present- Change in Bowel Habits. Not Present- Abdominal Pain, Bloating, Bloody Stool, Chronic diarrhea, Constipation, Difficulty Swallowing, Excessive gas, Gets full quickly at meals, Hemorrhoids, Indigestion, Nausea, Rectal Pain and Vomiting. Male Genitourinary Not Present- Blood in Urine, Change in Urinary Stream, Frequency, Impotence, Nocturia, Painful Urination, Urgency and Urine Leakage. Musculoskeletal Not Present- Back Pain, Joint Pain, Joint Stiffness, Muscle Pain, Muscle Weakness and Swelling of Extremities. Neurological Not Present- Decreased Memory, Fainting, Headaches, Numbness, Seizures, Tingling, Tremor, Trouble walking and Weakness. Psychiatric Not Present- Anxiety, Bipolar, Change in Sleep Pattern, Depression, Fearful and Frequent crying. Endocrine Not Present- Cold Intolerance, Excessive Hunger, Hair Changes, Heat Intolerance, Hot flashes and New Diabetes. Hematology Not Present- Blood Thinners, Easy Bruising, Excessive bleeding, Gland problems, HIV and Persistent Infections.  Vitals Emeline Gins CMA; 08/04/2018 10:45 AM) 08/04/2018 10:44 AM Weight: 163.4 lb Height: 70in Body Surface Area: 1.92 m Body Mass Index: 23.45 kg/m  Temp.: 98.36F   Pulse: 103 (Regular)  BP: 122/78 (Sitting, Left Arm, Standard)        Physical Exam Carlos Key K. Mazel Villela MD; 08/04/2018 11:17 AM)  The physical exam findings are as follows: Note:WDWN in NAD Eyes: Pupils equal, round; sclera anicteric HENT: Oral mucosa moist; good  dentition Neck: No masses palpated, no thyromegaly Lungs: CTA bilaterally; normal respiratory effort CV: Regular rate and rhythm; no murmurs; extremities well-perfused with no edema Abd: +bowel sounds, soft, non-tender, no palpable organomegaly; healed periumbilical incision GU: bilateral descended testes; no testicular masses; healed RIH incision; visible reducible LIH; no sign of RIH Skin: Warm, dry; no sign of jaundice Psychiatric - alert and oriented x 4; calm mood and affect    Assessment & Plan Molli Hazard(Allaina Brotzman K. Lasasha Brophy MD; 08/04/2018 11:00 AM)  INGUINAL HERNIA OF LEFT SIDE WITHOUT OBSTRUCTION OR GANGRENE (K40.90)  Current Plans Schedule for Surgery - Left inguinal hernia repair with mesh. The surgical procedure has been discussed with the patient. Potential risks, benefits, alternative treatments, and expected outcomes have been explained. All of the patient's questions at this time have been answered. The likelihood of reaching the patient's treatment goal is good. The patient understand the proposed surgical procedure and wishes to proceed.  Advised patient to stop ASA, anticoagulant, blood thinners, and NSAIDs three days prior to surgery.  Wilmon ArmsMatthew K. Corliss Skainssuei, MD, [facility]  General/ Trauma Surgery Beeper [phone]  08/04/2018 11:18 AM

## 2018-08-04 NOTE — H&P (View-Only) (Signed)
History of Present Illness Carlos [name]. Carlos Mucha MD; 08/04/2018 11:15 AM) The patient is a 73 year old male who presents with an inguinal hernia. Referred by Dr. Glendale Chard for left inguinal hernia  This is a 73 year old male with atrial fibrillation on [place] who presents with a six-week history of chronic cough. He has undergone several treatment regimens for this cough but it does seem to persist. About 4 weeks ago he began noticing some swelling and discomfort in his left groin. He has had a previous right inguinal hernia repair. Since he noticed a bulge in his left groin, he has noticed more difficulty with bowel movements. He denies any nausea or vomiting. He presents now to discuss management of the new left inguinal hernia.   Problem List/Past Medical Rodman Key K. Sinclair Alligood, MD; 08/04/2018 11:16 AM) INGUINAL HERNIA OF LEFT SIDE WITHOUT OBSTRUCTION OR GANGRENE (K40.90)  Past Surgical History Emeline Gins, [place]; 08/04/2018 10:44 AM) Open Inguinal Hernia Surgery Right. Oral Surgery Vasectomy  Diagnostic Studies History Emeline Gins, [place]; 08/04/2018 10:44 AM) Colonoscopy 1-5 years ago  Allergies Emeline Gins, CMA; 08/04/2018 10:45 AM) No Known Drug Allergies [08/04/2018]: Allergies Reconciled  Medication History Emeline Gins, CMA; 08/04/2018 10:46 AM) Eliquis (5MG  Tablet, Oral) Active. Metoprolol Succinate (25MG  CP24 Sprinkle, Oral) Active. Zolpidem Tartrate (5MG  Tablet, Oral) Active. Medications Reconciled  Social History Emeline Gins, [place]; 08/04/2018 10:44 AM) Alcohol use Moderate alcohol use. Caffeine use Carbonated beverages, Coffee, Tea. No drug use Tobacco use Former smoker.  Family History Emeline Gins, [place]; 08/04/2018 10:44 AM) Heart Disease Father.  Other Problems Carlos [name]. Glena Pharris, MD; 08/04/2018 11:16 AM) Atrial Fibrillation Gastroesophageal Reflux Disease Inguinal Hernia     Review of Systems Emeline Gins CMA;  08/04/2018 10:44 AM) General Not Present- Appetite Loss, Chills, Fatigue, Fever, Night Sweats, Weight Gain and Weight Loss. Skin Not Present- Change in Wart/Mole, Dryness, Hives, Jaundice, New Lesions, Non-Healing Wounds, Rash and Ulcer. HEENT Present- Seasonal Allergies and Wears glasses/contact lenses. Not Present- Earache, Hearing Loss, Hoarseness, Nose Bleed, Oral Ulcers, Ringing in the Ears, Sinus Pain, Sore Throat, Visual Disturbances and Yellow Eyes. Respiratory Not Present- Bloody sputum, Chronic Cough, Difficulty Breathing, Snoring and Wheezing. Cardiovascular Not Present- Chest Pain, Difficulty Breathing Lying Down, Leg Cramps, Palpitations, Rapid Heart Rate, Shortness of Breath and Swelling of Extremities. Gastrointestinal Present- Change in Bowel Habits. Not Present- Abdominal Pain, Bloating, Bloody Stool, Chronic diarrhea, Constipation, Difficulty Swallowing, Excessive gas, Gets full quickly at meals, Hemorrhoids, Indigestion, Nausea, Rectal Pain and Vomiting. Male Genitourinary Not Present- Blood in Urine, Change in Urinary Stream, Frequency, Impotence, Nocturia, Painful Urination, Urgency and Urine Leakage. Musculoskeletal Not Present- Back Pain, Joint Pain, Joint Stiffness, Muscle Pain, Muscle Weakness and Swelling of Extremities. Neurological Not Present- Decreased Memory, Fainting, Headaches, Numbness, Seizures, Tingling, Tremor, Trouble walking and Weakness. Psychiatric Not Present- Anxiety, Bipolar, Change in Sleep Pattern, Depression, Fearful and Frequent crying. Endocrine Not Present- Cold Intolerance, Excessive Hunger, Hair Changes, Heat Intolerance, Hot flashes and New Diabetes. Hematology Not Present- Blood Thinners, Easy Bruising, Excessive bleeding, Gland problems, HIV and Persistent Infections.  Vitals Emeline Gins CMA; 08/04/2018 10:45 AM) 08/04/2018 10:44 AM Weight: 163.4 lb Height: 70in Body Surface Area: 1.92 m Body Mass Index: 23.45 kg/m  Temp.: 98.36F   Pulse: 103 (Regular)  BP: 122/78 (Sitting, Left Arm, Standard)        Physical Exam Rodman Key K. Marquize Seib MD; 08/04/2018 11:17 AM)  The physical exam findings are as follows: Note:WDWN in NAD Eyes: Pupils equal, round; sclera anicteric HENT: Oral mucosa moist; good  dentition Neck: No masses palpated, no thyromegaly Lungs: CTA bilaterally; normal respiratory effort CV: Regular rate and rhythm; no murmurs; extremities well-perfused with no edema Abd: +bowel sounds, soft, non-tender, no palpable organomegaly; healed periumbilical incision GU: bilateral descended testes; no testicular masses; healed RIH incision; visible reducible LIH; no sign of RIH Skin: Warm, dry; no sign of jaundice Psychiatric - alert and oriented x 4; calm mood and affect    Assessment & Plan (Rhema Boyett K. Chessa Barrasso MD; 08/04/2018 11:00 AM)  INGUINAL HERNIA OF LEFT SIDE WITHOUT OBSTRUCTION OR GANGRENE (K40.90)  Current Plans Schedule for Surgery - Left inguinal hernia repair with mesh. The surgical procedure has been discussed with the patient. Potential risks, benefits, alternative treatments, and expected outcomes have been explained. All of the patient's questions at this time have been answered. The likelihood of reaching the patient's treatment goal is good. The patient understand the proposed surgical procedure and wishes to proceed.  Advised patient to stop ASA, anticoagulant, blood thinners, and NSAIDs three days prior to surgery.  Lizzeth Meder K. Willoughby Doell, MD, FACS [facility]  General/ Trauma Surgery Beeper [phone]  08/04/2018 11:18 AM  

## 2018-08-05 ENCOUNTER — Telehealth: Payer: Self-pay | Admitting: Cardiovascular Disease

## 2018-08-05 NOTE — Telephone Encounter (Signed)
Follow up:     Patient calling concering his medical clearance and would like to know can it be sent [phone]. If  Any questions please call Dr. Georgette Dover [phone].

## 2018-08-05 NOTE — Telephone Encounter (Signed)
Patient informed me that he already has appt with Jory Sims on 7/15 at 2:45 pm. Pt thanked me for the call.

## 2018-08-05 NOTE — Telephone Encounter (Signed)
S/w pt answered no to all questions and will arrive @230pm  with mask and no visitors      COVID-19 Pre-Screening Questions:  . In the past 7 to 10 days have you had a cough,  shortness of breath, headache, congestion, fever (100 or greater) body aches, chills, sore throat, or sudden loss of taste or sense of smell? NO . Have you been around anyone with known Covid 19.  NO . Have you been around anyone who is awaiting Covid 19 test results in the past 7 to 10 days? NO . Have you been around anyone who has been exposed to Covid 19, or has mentioned symptoms of Covid 19 within the past 7 to 10 days? NO  If you have any concerns/questions about symptoms patients report during screening (either on the phone or at threshold). Contact the provider seeing the patient or DOD for further guidance.  If neither are available contact a member of the leadership team.

## 2018-08-05 NOTE — Telephone Encounter (Signed)
Patient with diagnosis of afib on Eliquis for anticoagulation.    Procedure: hernia surgery Date of procedure: TBD  CHADS2-VASc score of  2 (CHF, HTN, AGE, DM2, stroke/tia x 2, CAD, AGE, male)  CrCl 32ml/min   Per office protocol, patient can hold Eliquis for 2 days prior to procedure.

## 2018-08-05 NOTE — Telephone Encounter (Signed)
No message needed °

## 2018-08-05 NOTE — Telephone Encounter (Signed)
Pt is calling back

## 2018-08-05 NOTE — Telephone Encounter (Signed)
Pt needs appt with APP in next  1-2 weeks for pre-op see prior note

## 2018-08-05 NOTE — Telephone Encounter (Signed)
I have attempted to contact this patient by phone about surgical clearance recommendations but line was busy. Will continue trying again later

## 2018-08-06 ENCOUNTER — Encounter: Payer: Self-pay | Admitting: Internal Medicine

## 2018-08-06 ENCOUNTER — Ambulatory Visit (INDEPENDENT_AMBULATORY_CARE_PROVIDER_SITE_OTHER): Payer: Medicare Other | Admitting: Adult Health

## 2018-08-06 ENCOUNTER — Other Ambulatory Visit: Payer: Self-pay

## 2018-08-06 ENCOUNTER — Encounter: Payer: Self-pay | Admitting: Adult Health

## 2018-08-06 VITALS — BP 131/82 | HR 69 | Temp 97.3°F | Wt 162.0 lb

## 2018-08-06 DIAGNOSIS — Z01810 Encounter for preprocedural cardiovascular examination: Secondary | ICD-10-CM

## 2018-08-06 DIAGNOSIS — I483 Typical atrial flutter: Secondary | ICD-10-CM | POA: Diagnosis not present

## 2018-08-06 DIAGNOSIS — I10 Essential (primary) hypertension: Secondary | ICD-10-CM

## 2018-08-06 NOTE — Progress Notes (Signed)
Cardiology Office Note   Date:  08/06/2018   ID:  Carlos [name], DOB 1945-11-02, MRN [id number]  PCP:  Carlos [name]  Cardiologist: Dr.  Duke [name]  Chief Complaint  Patient presents with  . 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  Was last seen in the office on 09/12/2017 by CarlosMarkesan. He had a GXT that was negative for ischemia. He was continuing to be active walking 20 minutes 3 times a week. He was asymptomatic. No testing or medication changes were made.   He comes today for preoperative evaluation for inguinal hernia repair on the left with Carlos [name], of [facility].  Date is to be announced.  The patient is on apixaban with recommendations on when to hold medication perioperatively.  He denies any chest pain, rapid heart rhythm, bleeding issues to include hemoptysis or melena, dyspnea on exertion, or fatigue.  He states he walks daily in his neighborhood and has not had any symptoms doing so.  He is medically compliant.  He has annual labs through his primary care physician Carlos [name] who manages his medications.  Past Medical History:  Diagnosis Date  . Allergic rhinitis   . Atypical chest pain 09/16/2015  . Elevated blood pressure 10/19/2015  . Insomnia   . Mixed hyperlipidemia   . PVC (premature ventricular contraction) 10/19/2015    Past Surgical History:  Procedure Laterality Date  . ASD REPAIR  1994  . INGUINAL HERNIA REPAIR  1994     Current Outpatient Medications  Medication Sig Dispense Refill  . apixaban (ELIQUIS) 5 MG TABS tablet Take 1 tablet (5 mg total) by mouth 2 (two) times daily. 180 tablet 3  . CALCIUM PO Take 1 tablet by mouth daily.     . Cholecalciferol (VITAMIN D PO) Take 1 capsule by mouth daily.      Marland Kitchen. MAGNESIUM PO Take by mouth.    . Multiple Vitamin (MULTIVITAMIN) capsule Take 1 capsule by mouth daily.    . Omega-3 Fatty Acids (FISH OIL PO) Take 1 capsule by mouth daily.     Marland Kitchen. zolpidem (AMBIEN) 10 MG tablet Take 1 tablet (10 mg total) by mouth at bedtime as needed. 30 tablet 2  . metoprolol tartrate (LOPRESSOR) 50 MG tablet Take 1 tablet (50 mg total) by mouth 2 (two) times daily. 180 tablet 3   No current facility-administered medications for this visit.     Allergies:   Patient has no known allergies.    Social History:  The patient  reports that he quit smoking about 12 years ago. His smoking use included cigarettes. He has a 4.50 pack-year smoking history. He has never used smokeless tobacco. He reports current alcohol use of about 1.0 standard drinks of alcohol per week. He reports that he does not use drugs.   Family History:  The patient's family history includes Healthy in his mother; Heart attack in his father; Pulmonary embolism in his brother.    ROS: All other systems are reviewed and negative. Unless otherwise mentioned in H&P    PHYSICAL EXAM: VS:  BP 131/82   Pulse 69   Temp (!) 97.3 F (36.3 C)   Wt 162 lb (73.5 kg)   SpO2 99%   BMI 23.24 kg/m  , BMI Body  mass index is 23.24 kg/m. GEN: Well nourished, well developed, in no acute distress HEENT: normal Neck: no JVD, carotid bruits, or masses Cardiac: RRR; no murmurs, rubs, or gallops,no edema  Respiratory:  Clear to auscultation bilaterally, normal work of breathing GI: soft, nontender, nondistended, + BS MS: no deformity or atrophy Skin: warm and dry, no rash Neuro:  Strength and sensation are intact Psych: euthymic mood, full affect   EKG: Atrial flutter with variable 2-1 and 3-1 AV block heart rate of 89 bpm  Recent Labs: 07/24/2018: ALT 18; BUN 16; Creatinine, Ser 1.04; Hemoglobin 15.6; Platelets 303; Potassium 4.5; Sodium 141    Lipid Panel    Component Value Date/Time   CHOL 216 (H)  07/24/2018 0932   TRIG 233 (H) 07/24/2018 0932   HDL 35 (L) 07/24/2018 0932   CHOLHDL 6.2 (H) 07/24/2018 0932   LDLCALC 134 (H) 07/24/2018 0932      Wt Readings from Last 3 Encounters:  08/06/18 162 lb (73.5 kg)  07/24/18 162 lb 3.2 oz (73.6 kg)  07/24/18 162 lb 3.2 oz (73.6 kg)      Other studies Reviewed: Echo 05/2017: Study Conclusions  - Left ventricle: The cavity size was normal. Systolic function was normal. The estimated ejection fraction was in the range of 60% to 65%. Wall motion was normal; there were no regional wall motion abnormalities. - Ventricular septum: Septal motion showed paradox. - Mitral valve: There was mild to moderate regurgitation directed centrally. - Left atrium: The atrium was mildly dilated. - Right ventricle: The cavity size was moderately dilated. Wall thickness was normal. - Right atrium: The atrium was mildly dilated. - Atrial septum: No defect or patent foramen ovale was identified. - Pulmonary arteries: PA peak pressure: 32 mm Hg (S).  Impressions:  - Atrial flutter with variable block and mild RVR during the entire study.  ASSESSMENT AND PLAN:  1.Pre-Operative Evaluation: Chart reviewed as part of pre-operative protocol coverage. Given past medical history and time since last visit, based on ACC/AHA guidelines, [name] would be at acceptable risk for the planned procedure, left inguinal hernia repair, without further cardiovascular testing.  Eliquis will need to be held for 48 hours preoperatively and restart as soon as possible thereafter at the discretion of his surgeon.  2.  Atrial flutter: Heart rate is well controlled currently.  He will continue on metoprolol 50 mg twice daily along with apixaban with stipulations above.  He will follow-up with Carlos [name] on next office visit.  3.  Hypertension: Blood pressures currently very well controlled.  We will not make any changes in his medication regimen at this  time.  Current medicines are reviewed at length with the patient today.    Labs/ tests ordered today include: None  Carlos Myron. West Pugh, ANP, AACC   08/06/2018 5:04 PM    [facility] [address] Office [phone] Fax [phone]

## 2018-08-06 NOTE — Patient Instructions (Addendum)
Medication Instructions:  Continue current medications  If you need a refill on your cardiac medications before your next appointment, please call your pharmacy.  Labwork: None Ordered   Testing/Procedures: You are cleared for surgery, Hold Eliquis 48 hour prior to surgery  Follow-Up: . Your physician recommends that you schedule a follow-up appointment in: Keep appointment with Dr Oval Linsey on September 10th   At [facility], you and your health needs are our priority.  As part of our continuing mission to provide you with exceptional heart care, we have created designated Provider Care Teams.  These Care Teams include your primary Cardiologist (physician) and Advanced Practice Providers (APPs -  Physician Assistants and Nurse Practitioners) who all work together to provide you with the care you need, when you need it.  Thank you for choosing [facility] at [facility]!!

## 2018-08-12 ENCOUNTER — Other Ambulatory Visit: Payer: Self-pay | Admitting: Internal Medicine

## 2018-08-12 DIAGNOSIS — R1909 Other intra-abdominal and pelvic swelling, mass and lump: Secondary | ICD-10-CM

## 2018-08-18 NOTE — Telephone Encounter (Addendum)
Covering preop box today. On 7/15 OV by Arnold Long, I did not see routing hx under Misc Reports to surgeon's fax contact so have routed her note to requesting fax number (attn Emeline Gins as requested). Xzayvion Vaeth PA-C

## 2018-08-21 ENCOUNTER — Other Ambulatory Visit: Payer: Self-pay | Admitting: Otolaryngology

## 2018-08-21 DIAGNOSIS — K219 Gastro-esophageal reflux disease without esophagitis: Secondary | ICD-10-CM

## 2018-08-26 ENCOUNTER — Other Ambulatory Visit: Payer: Self-pay

## 2018-08-26 ENCOUNTER — Encounter (HOSPITAL_BASED_OUTPATIENT_CLINIC_OR_DEPARTMENT_OTHER): Payer: Self-pay | Admitting: *Deleted

## 2018-08-26 NOTE — Progress Notes (Signed)

## 2018-08-27 ENCOUNTER — Ambulatory Visit
Admission: RE | Admit: 2018-08-27 | Discharge: 2018-08-27 | Disposition: A | Discharge status: Home / Self Care | Payer: Medicare Other | Source: Ambulatory Visit | Attending: Internal Medicine | Admitting: Internal Medicine

## 2018-08-27 DIAGNOSIS — R1909 Other intra-abdominal and pelvic swelling, mass and lump: Secondary | ICD-10-CM

## 2018-08-27 MED ORDER — IOPAMIDOL (ISOVUE-300) INJECTION 61%
100.0000 mL | Freq: Once | INTRAVENOUS | Status: AC | PRN
  Administered 2018-08-27: 100 mL via INTRAVENOUS

## 2018-08-29 ENCOUNTER — Telehealth: Payer: Self-pay

## 2018-08-29 NOTE — Telephone Encounter (Signed)
-----   Message from Robyn Sanders, MD sent at 08/28/2018  8:39 PM EDT ----- Same as definition for unremarkable- not particularly interesting or surprising. Nothing further is needed.   RS ----- Message ----- From: Elizabethann Lackey, CMA Sent: 08/28/2018   4:54 PM EDT To: Robyn Sanders, MD  The patient called and left a message that he wanted to know what unremarkable means on his CT report because he has seen the report on MyChart and that it has that word on the report several times.   

## 2018-08-29 NOTE — Telephone Encounter (Signed)
-----   Message from Glendale Chard, MD sent at 08/28/2018  8:39 PM EDT ----- Same as definition for unremarkable- not particularly interesting or surprising. Nothing further is needed.   RS ----- Message ----- From: Michelle Nasuti, [place] Sent: 08/28/2018   4:54 PM EDT To: Glendale Chard, MD  The patient called and left a message that he wanted to know what unremarkable means on his CT report because he has seen the report on MyChart and that it has that word on the report several times.

## 2018-08-29 NOTE — Telephone Encounter (Signed)
The pt said that his ct report said that he has plaque in his arteries  and that the message about his lab results said that a cholesterol med is suggested.  The pt said that he is willing to start a cholesterol medication that is a low dose med.

## 2018-08-29 NOTE — Telephone Encounter (Signed)
Left a detailed message at pt's request. 

## 2018-08-29 NOTE — Telephone Encounter (Signed)
Please send in rx rosuvastatin 10mg  daily #30/1 refills. Follow-up in six weeks for re-evaluation/chol check

## 2018-08-30 ENCOUNTER — Other Ambulatory Visit (HOSPITAL_COMMUNITY)
Admission: RE | Admit: 2018-08-30 | Discharge: 2018-08-30 | Disposition: A | Discharge status: Home / Self Care | Payer: Medicare Other | Source: Ambulatory Visit | Attending: Surgery | Admitting: Surgery

## 2018-08-30 DIAGNOSIS — Z01812 Encounter for preprocedural laboratory examination: Secondary | ICD-10-CM | POA: Diagnosis present

## 2018-08-30 DIAGNOSIS — Z20828 Contact with and (suspected) exposure to other viral communicable diseases: Secondary | ICD-10-CM | POA: Insufficient documentation

## 2018-08-31 LAB — SARS CORONAVIRUS 2 (TAT 6-24 HRS): SARS Coronavirus 2: NEGATIVE

## 2018-09-01 ENCOUNTER — Other Ambulatory Visit: Payer: Self-pay

## 2018-09-01 MED ORDER — ROSUVASTATIN CALCIUM 10 MG PO TABS: 10.0000 mg | ORAL_TABLET | Freq: Every day | ORAL | 1 refills | Status: DC

## 2018-09-03 ENCOUNTER — Encounter (HOSPITAL_BASED_OUTPATIENT_CLINIC_OR_DEPARTMENT_OTHER)
Admission: RE | Disposition: A | Discharge status: Home / Self Care | Payer: Self-pay | Source: Home / Self Care | Attending: Surgery

## 2018-09-03 ENCOUNTER — Other Ambulatory Visit: Payer: Self-pay

## 2018-09-03 ENCOUNTER — Encounter (HOSPITAL_BASED_OUTPATIENT_CLINIC_OR_DEPARTMENT_OTHER): Payer: Self-pay | Admitting: *Deleted

## 2018-09-03 ENCOUNTER — Encounter: Payer: Self-pay | Admitting: Internal Medicine

## 2018-09-03 ENCOUNTER — Ambulatory Visit (HOSPITAL_BASED_OUTPATIENT_CLINIC_OR_DEPARTMENT_OTHER)
Admission: RE | Admit: 2018-09-03 | Discharge: 2018-09-03 | Disposition: A | Discharge status: Home / Self Care | Payer: Medicare Other | Attending: Surgery | Admitting: Surgery

## 2018-09-03 ENCOUNTER — Ambulatory Visit (HOSPITAL_BASED_OUTPATIENT_CLINIC_OR_DEPARTMENT_OTHER): Payer: Medicare Other | Admitting: Certified Registered"

## 2018-09-03 DIAGNOSIS — K4090 Unilateral inguinal hernia, without obstruction or gangrene, not specified as recurrent: Secondary | ICD-10-CM | POA: Insufficient documentation

## 2018-09-03 DIAGNOSIS — I4891 Unspecified atrial fibrillation: Secondary | ICD-10-CM | POA: Diagnosis not present

## 2018-09-03 DIAGNOSIS — K219 Gastro-esophageal reflux disease without esophagitis: Secondary | ICD-10-CM | POA: Diagnosis not present

## 2018-09-03 DIAGNOSIS — Z7901 Long term (current) use of anticoagulants: Secondary | ICD-10-CM | POA: Diagnosis not present

## 2018-09-03 DIAGNOSIS — I10 Essential (primary) hypertension: Secondary | ICD-10-CM | POA: Diagnosis not present

## 2018-09-03 DIAGNOSIS — Z87891 Personal history of nicotine dependence: Secondary | ICD-10-CM | POA: Insufficient documentation

## 2018-09-03 DIAGNOSIS — Z79899 Other long term (current) drug therapy: Secondary | ICD-10-CM | POA: Diagnosis not present

## 2018-09-03 HISTORY — PX: INGUINAL HERNIA REPAIR: SHX194

## 2018-09-03 HISTORY — DX: Gastro-esophageal reflux disease without esophagitis: K21.9

## 2018-09-03 HISTORY — DX: Essential (primary) hypertension: I10

## 2018-09-03 HISTORY — DX: Cardiac arrhythmia, unspecified: I49.9

## 2018-09-03 HISTORY — DX: Unilateral inguinal hernia, without obstruction or gangrene, not specified as recurrent: K40.90

## 2018-09-03 SURGERY — REPAIR, HERNIA, INGUINAL, ADULT
Anesthesia: General | Site: Inguinal | Laterality: Left

## 2018-09-03 MED ORDER — FENTANYL CITRATE (PF) 100 MCG/2ML IJ SOLN
50.0000 ug | INTRAMUSCULAR | Status: AC | PRN
  Administered 2018-09-03 (×3): 50 ug via INTRAVENOUS

## 2018-09-03 MED ORDER — ONDANSETRON HCL 4 MG/2ML IJ SOLN
INTRAMUSCULAR | Status: DC | PRN
  Administered 2018-09-03: 4 mg via INTRAVENOUS

## 2018-09-03 MED ORDER — BUPIVACAINE-EPINEPHRINE 0.25% -1:200000 IJ SOLN
INTRAMUSCULAR | Status: DC | PRN
  Administered 2018-09-03: 10 mL

## 2018-09-03 MED ORDER — GABAPENTIN 300 MG PO CAPS
ORAL_CAPSULE | ORAL | Status: AC
  Filled 2018-09-03: qty 1

## 2018-09-03 MED ORDER — CEFAZOLIN SODIUM-DEXTROSE 2-4 GM/100ML-% IV SOLN: 2.0000 g | INTRAVENOUS | Status: DC

## 2018-09-03 MED ORDER — DEXAMETHASONE SODIUM PHOSPHATE 10 MG/ML IJ SOLN
INTRAMUSCULAR | Status: DC | PRN
  Administered 2018-09-03: 10 mg via INTRAVENOUS

## 2018-09-03 MED ORDER — PROPOFOL 10 MG/ML IV BOLUS
INTRAVENOUS | Status: DC | PRN
  Administered 2018-09-03: 120 mg via INTRAVENOUS

## 2018-09-03 MED ORDER — MIDAZOLAM HCL 2 MG/2ML IJ SOLN
1.0000 mg | INTRAMUSCULAR | Status: DC | PRN
  Administered 2018-09-03: 1 mg via INTRAVENOUS

## 2018-09-03 MED ORDER — ONDANSETRON HCL 4 MG/2ML IJ SOLN
INTRAMUSCULAR | Status: AC
  Filled 2018-09-03: qty 2

## 2018-09-03 MED ORDER — BUPIVACAINE-EPINEPHRINE (PF) 0.5% -1:200000 IJ SOLN
INTRAMUSCULAR | Status: DC | PRN
  Administered 2018-09-03: 30 mL

## 2018-09-03 MED ORDER — CHLORHEXIDINE GLUCONATE CLOTH 2 % EX PADS: 6.0000 | MEDICATED_PAD | Freq: Once | CUTANEOUS | Status: DC

## 2018-09-03 MED ORDER — DEXAMETHASONE SODIUM PHOSPHATE 10 MG/ML IJ SOLN
INTRAMUSCULAR | Status: AC
  Filled 2018-09-03: qty 1

## 2018-09-03 MED ORDER — MIDAZOLAM HCL 2 MG/2ML IJ SOLN
INTRAMUSCULAR | Status: AC
  Filled 2018-09-03: qty 2

## 2018-09-03 MED ORDER — CEFAZOLIN SODIUM-DEXTROSE 2-4 GM/100ML-% IV SOLN
INTRAVENOUS | Status: AC
  Filled 2018-09-03: qty 100

## 2018-09-03 MED ORDER — GABAPENTIN 300 MG PO CAPS
300.0000 mg | ORAL_CAPSULE | ORAL | Status: AC
  Administered 2018-09-03: 300 mg via ORAL

## 2018-09-03 MED ORDER — SCOPOLAMINE 1 MG/3DAYS TD PT72: 1.0000 | MEDICATED_PATCH | Freq: Once | TRANSDERMAL | Status: DC

## 2018-09-03 MED ORDER — ACETAMINOPHEN 500 MG PO TABS
1000.0000 mg | ORAL_TABLET | ORAL | Status: AC
  Administered 2018-09-03: 1000 mg via ORAL

## 2018-09-03 MED ORDER — PHENYLEPHRINE HCL (PRESSORS) 10 MG/ML IV SOLN
INTRAVENOUS | Status: DC | PRN
  Administered 2018-09-03: 120 ug via INTRAVENOUS

## 2018-09-03 MED ORDER — LIDOCAINE HCL (CARDIAC) PF 100 MG/5ML IV SOSY
PREFILLED_SYRINGE | INTRAVENOUS | Status: DC | PRN
  Administered 2018-09-03: 100 mg via INTRAVENOUS

## 2018-09-03 MED ORDER — LACTATED RINGERS IV SOLN
INTRAVENOUS | Status: DC
  Administered 2018-09-03: 12:00:00 via INTRAVENOUS

## 2018-09-03 MED ORDER — ACETAMINOPHEN 500 MG PO TABS
ORAL_TABLET | ORAL | Status: AC
  Filled 2018-09-03: qty 2

## 2018-09-03 MED ORDER — PROPOFOL 10 MG/ML IV BOLUS
INTRAVENOUS | Status: AC
  Filled 2018-09-03: qty 20

## 2018-09-03 MED ORDER — FENTANYL CITRATE (PF) 100 MCG/2ML IJ SOLN
INTRAMUSCULAR | Status: AC
  Filled 2018-09-03: qty 2

## 2018-09-03 MED ORDER — OXYCODONE HCL 5 MG PO TABS: 5.0000 mg | ORAL_TABLET | Freq: Four times a day (QID) | ORAL | 0 refills | Status: DC | PRN

## 2018-09-03 MED ORDER — CEFAZOLIN SODIUM-DEXTROSE 2-3 GM-%(50ML) IV SOLR
INTRAVENOUS | Status: DC | PRN
  Administered 2018-09-03: 2 g via INTRAVENOUS

## 2018-09-03 SURGICAL SUPPLY — 54 items
BENZOIN TINCTURE PRP APPL 2/3 (GAUZE/BANDAGES/DRESSINGS) ×2 IMPLANT
BLADE CLIPPER SURG (BLADE) ×2 IMPLANT
BLADE HEX COATED 2.75 (ELECTRODE) ×2 IMPLANT
BLADE SURG 15 STRL LF DISP TIS (BLADE) ×2 IMPLANT
BLADE SURG 15 STRL SS (BLADE) ×2
CANISTER SUCT 1200ML W/VALVE (MISCELLANEOUS) IMPLANT
CHLORAPREP W/TINT 26 (MISCELLANEOUS) ×2 IMPLANT
COVER BACK TABLE REUSABLE LG (DRAPES) ×2 IMPLANT
COVER MAYO STAND REUSABLE (DRAPES) ×2 IMPLANT
COVER WAND RF STERILE (DRAPES) IMPLANT
DECANTER SPIKE VIAL GLASS SM (MISCELLANEOUS) ×2 IMPLANT
DRAIN PENROSE 1/2X12 LTX STRL (WOUND CARE) ×2 IMPLANT
DRAPE LAPAROTOMY TRNSV 102X78 (DRAPES) ×2 IMPLANT
DRAPE UTILITY XL STRL (DRAPES) ×2 IMPLANT
DRSG TEGADERM 4X4.75 (GAUZE/BANDAGES/DRESSINGS) ×2 IMPLANT
ELECT REM PT RETURN 9FT ADLT (ELECTROSURGICAL) ×2
ELECTRODE REM PT RTRN 9FT ADLT (ELECTROSURGICAL) ×1 IMPLANT
GAUZE SPONGE 4X4 12PLY STRL LF (GAUZE/BANDAGES/DRESSINGS) ×2 IMPLANT
GLOVE BIO SURGEON STRL SZ 6.5 (GLOVE) ×2 IMPLANT
GLOVE BIO SURGEON STRL SZ7 (GLOVE) ×2 IMPLANT
GLOVE BIOGEL PI IND STRL 7.0 (GLOVE) ×1 IMPLANT
GLOVE BIOGEL PI IND STRL 7.5 (GLOVE) ×1 IMPLANT
GLOVE BIOGEL PI IND STRL 8 (GLOVE) ×1 IMPLANT
GLOVE BIOGEL PI INDICATOR 7.0 (GLOVE) ×1
GLOVE BIOGEL PI INDICATOR 7.5 (GLOVE) ×1
GLOVE BIOGEL PI INDICATOR 8 (GLOVE) ×1
GOWN STRL REUS W/ TWL LRG LVL3 (GOWN DISPOSABLE) ×3 IMPLANT
GOWN STRL REUS W/TWL LRG LVL3 (GOWN DISPOSABLE) ×3
MESH PARIETEX PROGRIP LEFT (Mesh General) ×2 IMPLANT
NEEDLE HYPO 25X1 1.5 SAFETY (NEEDLE) ×2 IMPLANT
NS IRRIG 1000ML POUR BTL (IV SOLUTION) IMPLANT
PACK BASIN DAY SURGERY FS (CUSTOM PROCEDURE TRAY) ×2 IMPLANT
PENCIL BUTTON HOLSTER BLD 10FT (ELECTRODE) ×2 IMPLANT
SLEEVE SCD COMPRESS KNEE MED (MISCELLANEOUS) ×2 IMPLANT
SPONGE INTESTINAL PEANUT (DISPOSABLE) ×2 IMPLANT
SPONGE LAP 18X18 RF (DISPOSABLE) IMPLANT
STRIP CLOSURE SKIN 1/2X4 (GAUZE/BANDAGES/DRESSINGS) ×2 IMPLANT
SUT MON AB 4-0 PC3 18 (SUTURE) ×2 IMPLANT
SUT PDS AB 0 CT 36 (SUTURE) IMPLANT
SUT SILK 2 0 SH (SUTURE) IMPLANT
SUT SILK 3 0 SH 30 (SUTURE) IMPLANT
SUT SILK 3 0 TIES 17X18 (SUTURE)
SUT SILK 3-0 18XBRD TIE BLK (SUTURE) IMPLANT
SUT VIC AB 0 CT1 27 (SUTURE) ×1
SUT VIC AB 0 CT1 27XBRD ANBCTR (SUTURE) ×1 IMPLANT
SUT VIC AB 0 SH 27 (SUTURE) ×2 IMPLANT
SUT VIC AB 2-0 SH 27 (SUTURE) ×1
SUT VIC AB 2-0 SH 27XBRD (SUTURE) ×1 IMPLANT
SUT VIC AB 3-0 SH 27 (SUTURE) ×1
SUT VIC AB 3-0 SH 27X BRD (SUTURE) ×1 IMPLANT
SYR CONTROL 10ML LL (SYRINGE) ×2 IMPLANT
TOWEL GREEN STERILE FF (TOWEL DISPOSABLE) ×2 IMPLANT
TUBE CONNECTING 20X1/4 (TUBING) IMPLANT
YANKAUER SUCT BULB TIP NO VENT (SUCTIONS) IMPLANT

## 2018-09-03 NOTE — Interval H&P Note (Signed)
History and Physical Interval Note:  09/03/2018 12:23 PM  Carlos [name]  has presented today for surgery, with the diagnosis of LEFT INGUINAL HERNIA.  The various methods of treatment have been discussed with the patient and family. After consideration of risks, benefits and other options for treatment, the patient has consented to  Procedure(s): LEFT INGUINAL HERNIA REPAIR WITH MESH (Left) as a surgical intervention.  The patient's history has been reviewed, patient examined, no change in status, stable for surgery.  I have reviewed the patient's chart and labs.  Questions were answered to the patient's satisfaction.     Maia Petties

## 2018-09-03 NOTE — Discharge Instructions (Signed)
**You had 1000 mg of Tylenol at 12pm  [facility] _______[facility]  UMBILICAL OR INGUINAL HERNIA REPAIR: POST OP INSTRUCTIONS  Always review your discharge instruction sheet given to you by the facility where your surgery was performed. IF YOU HAVE DISABILITY OR FAMILY LEAVE FORMS, YOU MUST BRING THEM TO THE OFFICE FOR PROCESSING.   DO NOT GIVE THEM TO YOUR DOCTOR.  1. A  prescription for pain medication may be given to you upon discharge.  Take your pain medication as prescribed, if needed.  If narcotic pain medicine is not needed, then you may take acetaminophen (Tylenol) or ibuprofen (Advil) as needed. 2. Take your usually prescribed medications unless otherwise directed. If you need a refill on your pain medication, please contact your pharmacy.  They will contact our office to request authorization. Prescriptions will not be filled after 5 pm or on week-ends. 3. You should follow a light diet the first 24 hours after arrival home, such as soup and crackers, etc.  Be sure to include lots of fluids daily.  Resume your normal diet the day after surgery. 4.Most patients will experience some swelling and bruising around the umbilicus or in the groin and scrotum.  Ice packs and reclining will help.  Swelling and bruising can take several days to resolve.  6. It is common to experience some constipation if taking pain medication after surgery.  Increasing fluid intake and taking a stool softener (such as Colace) will usually help or prevent this problem from occurring.  A mild laxative (Milk of Magnesia or Miralax) should be taken according to package directions if there are no bowel movements after 48 hours. 7. Unless discharge instructions indicate otherwise, you may remove your bandages 24-48 hours after surgery, and you may shower at that time.  You may have steri-strips (small skin tapes) in place directly over the incision.  These strips should be left on the skin for 7-10 days.  If  your surgeon used skin glue on the incision, you may shower in 24 hours.  The glue will flake off over the next 2-3 weeks.  Any sutures or staples will be removed at the office during your follow-up visit. 8. ACTIVITIES:  You may resume regular (light) daily activities beginning the next day--such as daily self-care, walking, climbing stairs--gradually increasing activities as tolerated.  You may have sexual intercourse when it is comfortable.  Refrain from any heavy lifting or straining until approved by your doctor.  a.You may drive when you are no longer taking prescription pain medication, you can comfortably wear a seatbelt, and you can safely maneuver your car and apply brakes. b.RETURN TO WORK:   _____________________________________________  9.You should see your doctor in the office for a follow-up appointment approximately 2-3 weeks after your surgery.  Make sure that you call for this appointment within a day or two after you arrive home to insure a convenient appointment time. 10.OTHER INSTRUCTIONS: _________________________    _____________________________________  WHEN TO CALL YOUR DOCTOR: 1. Fever over 101.0 2. Inability to urinate 3. Nausea and/or vomiting 4. Extreme swelling or bruising 5. Continued bleeding from incision. 6. Increased pain, redness, or drainage from the incision  The clinic staff is available to answer your questions during regular business hours.  Please dont hesitate to call and ask to speak to one of the nurses for clinical concerns.  If you have a medical emergency, go to the nearest emergency room or call 911.  [facility] from [facility]  is always on call at the hospital   [address], [place], [place], [place]  [address] ?  [address], [place], [place]   [address] [phone] ? [phone] ? FAX [phone] Web site: [email or web address]    Post Anesthesia Home Care Instructions  Activity: Get plenty of  rest for the remainder of the day. A responsible individual must stay with you for 24 hours following the procedure.  For the next 24 hours, DO NOT: -Drive a car -Paediatric nurse -Drink alcoholic beverages -Take any medication unless instructed by your physician -Make any legal decisions or sign important papers.  Meals: Start with liquid foods such as gelatin or soup. Progress to regular foods as tolerated. Avoid greasy, spicy, heavy foods. If nausea and/or vomiting occur, drink only clear liquids until the nausea and/or vomiting subsides. Call your physician if vomiting continues.  Special Instructions/Symptoms: Your throat may feel dry or sore from the anesthesia or the breathing tube placed in your throat during surgery. If this causes discomfort, gargle with warm salt water. The discomfort should disappear within 24 hours.  If you had a scopolamine patch placed behind your ear for the management of post- operative nausea and/or vomiting:  1. The medication in the patch is effective for 72 hours, after which it should be removed.  Wrap patch in a tissue and discard in the trash. Wash hands thoroughly with soap and water. 2. You may remove the patch earlier than 72 hours if you experience unpleasant side effects which may include dry mouth, dizziness or visual disturbances. 3. Avoid touching the patch. Wash your hands with soap and water after contact with the patch.

## 2018-09-03 NOTE — Transfer of Care (Signed)
Immediate Anesthesia Transfer of Care Note  Patient: Carlos [name]  Procedure(s) Performed: LEFT INGUINAL HERNIA REPAIR WITH MESH (Left Inguinal)  Patient Location: PACU  Anesthesia Type:General and Regional  Level of Consciousness: drowsy and patient cooperative  Airway & Oxygen Therapy: Patient Spontanous Breathing and Patient connected to nasal cannula oxygen  Post-op Assessment: Report given to RN and Post -op Vital signs reviewed and stable  Post vital signs: Reviewed and stable  Last Vitals:  Vitals Value Taken Time  BP 113/79 09/03/18 1339  Temp    Pulse 61 09/03/18 1341  Resp 12 09/03/18 1341  SpO2 100 % 09/03/18 1341  Vitals shown include unvalidated device data.  Last Pain:  Vitals:   09/03/18 1154  TempSrc: Oral  PainSc: 0-No pain         Complications: No apparent anesthesia complications

## 2018-09-03 NOTE — Anesthesia Procedure Notes (Signed)
Procedure Name: LMA Insertion Date/Time: 09/03/2018 12:43 PM Performed by: Verita Lamb, CRNA Pre-anesthesia Checklist: Patient identified, Emergency Drugs available, Suction available and Patient being monitored Patient Re-evaluated:Patient Re-evaluated prior to induction Oxygen Delivery Method: Circle system utilized Preoxygenation: Pre-oxygenation with 100% oxygen Induction Type: IV induction Ventilation: Mask ventilation without difficulty LMA: LMA inserted LMA Size: 4.0 Number of attempts: 1 Placement Confirmation: positive ETCO2 and breath sounds checked- equal and bilateral Tube secured with: Tape Dental Injury: Teeth and Oropharynx as per pre-operative assessment

## 2018-09-03 NOTE — Anesthesia Procedure Notes (Signed)
Anesthesia Regional Block: TAP block   Pre-Anesthetic Checklist: ,, timeout performed, Correct Patient, Correct Site, Correct Laterality, Correct Procedure, Correct Position, site marked, Risks and benefits discussed,  Surgical consent,  Pre-op evaluation,  At surgeon's request and post-op pain management  Laterality: Left  Prep: chloraprep       Needles:  Injection technique: Single-shot  Needle Type: Echogenic Needle     Needle Length: 10cm  Needle Gauge: 21     Additional Needles:   Narrative:  Start time: 09/03/2018 12:11 PM End time: 09/03/2018 12:14 PM Injection made incrementally with aspirations every 5 mL.  Performed by: Personally  Anesthesiologist: Audry Pili, MD  Additional Notes: No pain on injection. No increased resistance to injection. Injection made in 5cc increments. Good needle visualization. Patient tolerated the procedure well.

## 2018-09-03 NOTE — Anesthesia Preprocedure Evaluation (Addendum)
Anesthesia Evaluation  Patient identified by MRN, date of birth, ID band Patient awake    Reviewed: Allergy & Precautions, NPO status , Patient's Chart, lab work & pertinent test results  History of Anesthesia Complications Negative for: history of anesthetic complications  Airway Mallampati: II  TM Distance: >3 FB Neck ROM: Full    Dental  (+) Edentulous Upper, Partial Lower   Pulmonary former smoker,    Pulmonary exam normal        Cardiovascular hypertension, Pt. on home beta blockers and Pt. on medications + dysrhythmias Atrial Fibrillation + Valvular Problems/Murmurs MR  Rhythm:Irregular Rate:Normal   '19 TTE - EF 60% to 65%. Ventricular septal motion showed paradox. Mild-mod MR. Left atrium was mildly dilated. RV cavity size was moderately dilated. Right atrium was mildly dilated. PA peak pressure: 32 mmHg.  Cardiology preop visit - "Chart reviewed as part of pre-operative protocol coverage. Given past medical history and time since last visit, based on ACC/AHA guidelines, Carlos [name] would be at acceptable risk for the planned procedure, left inguinal hernia repair, without further cardiovascular testing.  Eliquis will need to be held for 48 hours preoperatively and restart as soon as possible thereafter at the discretion of his surgeon."     Neuro/Psych  RLS  negative psych ROS   GI/Hepatic Neg liver ROS, GERD  Controlled,  Endo/Other  negative endocrine ROS  Renal/GU negative Renal ROS     Musculoskeletal negative musculoskeletal ROS (+)   Abdominal   Peds  Hematology negative hematology ROS (+)   Anesthesia Other Findings Insomnia   Reproductive/Obstetrics                            Anesthesia Physical Anesthesia Plan  ASA: III  Anesthesia Plan: General   Post-op Pain Management:  Regional for Post-op pain   Induction: Intravenous  PONV Risk Score and Plan: 2 and  Treatment may vary due to age or medical condition, Ondansetron and Dexamethasone  Airway Management Planned: LMA  Additional Equipment: None  Intra-op Plan:   Post-operative Plan: Extubation in OR  Informed Consent: I have reviewed the patients History and Physical, chart, labs and discussed the procedure including the risks, benefits and alternatives for the proposed anesthesia with the patient or authorized representative who has indicated his/her understanding and acceptance.     Dental advisory given  Plan Discussed with: CRNA and Anesthesiologist  Anesthesia Plan Comments:        Anesthesia Quick Evaluation

## 2018-09-03 NOTE — Progress Notes (Signed)
Assisted Dr. Brock with left, ultrasound guided, transabdominal plane block. Side rails up, monitors on throughout procedure. See vital signs in flow sheet. Tolerated Procedure well. 

## 2018-09-03 NOTE — Anesthesia Postprocedure Evaluation (Signed)
Anesthesia Post Note  Patient: Carlos [name]  Procedure(s) Performed: LEFT INGUINAL HERNIA REPAIR WITH MESH (Left Inguinal)     Patient location during evaluation: PACU Anesthesia Type: General Level of consciousness: awake and alert Pain management: pain level controlled Vital Signs Assessment: post-procedure vital signs reviewed and stable Respiratory status: spontaneous breathing, nonlabored ventilation and respiratory function stable Cardiovascular status: blood pressure returned to baseline and stable Postop Assessment: no apparent nausea or vomiting Anesthetic complications: no    Last Vitals:  Vitals:   09/03/18 1400 09/03/18 1401  BP:  110/70  Pulse: (!) 59 61  Resp: 10 10  Temp:    SpO2: 99% 99%    Last Pain:  Vitals:   09/03/18 1340  TempSrc:   PainSc: 0-No pain   Pain Goal:                   Lidia Collum

## 2018-09-03 NOTE — Op Note (Signed)
Hernia, Open, Procedure Note  Indications: The patient presented with a history of a left, reducible inguinal hernia.    Pre-operative Diagnosis: left reducible inguinal hernia Post-operative Diagnosis: same  Surgeon: Maia Petties   Assistants: Carlena Hurl, PA-C  Anesthesia: General LMA anesthesia and TAP block  ASA Class: 2  Procedure Details  The patient was seen again in the Holding Room. The risks, benefits, complications, treatment options, and expected outcomes were discussed with the patient. The possibilities of reaction to medication, pulmonary aspiration, perforation of viscus, bleeding, recurrent infection, the need for additional procedures, and development of a complication requiring transfusion or further operation were discussed with the patient and/or family. The likelihood of success in repairing the hernia and returning the patient to their previous functional status is good.  There was concurrence with the proposed plan, and informed consent was obtained. The site of surgery was properly noted/marked. The patient was taken to the Operating Room, identified as Mann Skaggs, and the procedure verified as left inguinal hernia repair. A Time Out was held and the above information confirmed.  The patient was placed in the supine position and underwent induction of anesthesia. The lower abdomen and groin was prepped with Chloraprep and draped in the standard fashion, and 0.25% Marcaine with epinephrine was used to anesthetize the skin over the mid-portion of the inguinal canal. An oblique incision was made. Dissection was carried down through the subcutaneous tissue with cautery to the external oblique fascia.  We opened the external oblique fascia along the direction of its fibers to the external ring.  The spermatic cord was circumferentially dissected bluntly and retracted with a Penrose drain.  The ilioinguinal nerve was identified and preserved.  The floor of the inguinal  canal was inspected and was lax, but no direct defect was noted.  We skeletonized the spermatic cord and reduced a moderate-sized indirect hernia sac . The internal ring was tightened with 0 Vicryl. We used a left Progrip mesh which was inserted and deployed across the floor of the inguinal canal. The mesh was tucked underneath the external oblique fascia laterally.  The flap of the mesh was closed around the spermatic cord to recreate the internal inguinal ring.  The mesh was secured to the pubic tubercle with 0 Vicryl.  The external oblique fascia was reapproximated with 2-0 Vicryl.  3-0 Vicryl was used to close the subcutaneous tissues and 4-0 Monocryl was used to close the skin in subcuticular fashion.  Benzoin and steri-strips were used to seal the incision.  A clean dressing was applied.  The patient was then extubated and brought to the recovery room in stable condition.  All sponge, instrument, and needle counts were correct prior to closure and at the conclusion of the case.   Estimated Blood Loss: Minimal                 Complications: None; patient tolerated the procedure well.         Disposition: PACU - hemodynamically stable.         Condition: stable  Carlos [name]. Georgette Dover, MD, [facility]  General/ Trauma Surgery Beeper [phone]  09/03/2018 1:37 PM

## 2018-09-05 ENCOUNTER — Encounter (HOSPITAL_BASED_OUTPATIENT_CLINIC_OR_DEPARTMENT_OTHER): Payer: Self-pay | Admitting: Surgery

## 2018-09-09 ENCOUNTER — Encounter: Payer: Self-pay | Admitting: Internal Medicine

## 2018-09-24 ENCOUNTER — Other Ambulatory Visit: Payer: Self-pay | Admitting: Cardiovascular Disease

## 2018-09-24 ENCOUNTER — Other Ambulatory Visit: Payer: Self-pay | Admitting: Internal Medicine

## 2018-09-24 NOTE — Telephone Encounter (Signed)
Please refill patient's prescription 

## 2018-09-24 NOTE — Telephone Encounter (Signed)
16M 73.5KG SCR 1.04 07/24/18 LOVW/LAWRENCE 08/06/18

## 2018-10-02 ENCOUNTER — Other Ambulatory Visit: Payer: Self-pay

## 2018-10-02 ENCOUNTER — Ambulatory Visit (INDEPENDENT_AMBULATORY_CARE_PROVIDER_SITE_OTHER): Payer: Medicare Other | Admitting: Cardiovascular Disease

## 2018-10-02 ENCOUNTER — Encounter: Payer: Self-pay | Admitting: Cardiovascular Disease

## 2018-10-02 VITALS — BP 118/74 | HR 86 | Temp 98.4°F | Ht 70.0 in | Wt 165.4 lb

## 2018-10-02 DIAGNOSIS — E7800 Pure hypercholesterolemia, unspecified: Secondary | ICD-10-CM

## 2018-10-02 DIAGNOSIS — I483 Typical atrial flutter: Secondary | ICD-10-CM | POA: Diagnosis not present

## 2018-10-02 DIAGNOSIS — I480 Paroxysmal atrial fibrillation: Secondary | ICD-10-CM | POA: Diagnosis not present

## 2018-10-02 DIAGNOSIS — I10 Essential (primary) hypertension: Secondary | ICD-10-CM

## 2018-10-02 HISTORY — DX: Pure hypercholesterolemia, unspecified: E78.00

## 2018-10-02 HISTORY — DX: Typical atrial flutter: I48.3

## 2018-10-02 MED ORDER — PANTOPRAZOLE SODIUM 40 MG PO TBEC: 40.0000 mg | DELAYED_RELEASE_TABLET | Freq: Every day | ORAL | 3 refills | Status: DC

## 2018-10-02 MED ORDER — METOPROLOL TARTRATE 50 MG PO TABS: 50.0000 mg | ORAL_TABLET | Freq: Two times a day (BID) | ORAL | 3 refills | Status: DC

## 2018-10-02 MED ORDER — APIXABAN 5 MG PO TABS: 5.0000 mg | ORAL_TABLET | Freq: Two times a day (BID) | ORAL | 1 refills | Status: DC

## 2018-10-02 NOTE — Patient Instructions (Addendum)
Medication Instructions:  START PANTOPRAZOLE 40 MG DAILY   If you need a refill on your cardiac medications before your next appointment, please call your pharmacy.   Lab work: NONE  Testing/Procedures: NONE  Follow-Up: At [facility], you and your health needs are our priority.  As part of our continuing mission to provide you with exceptional heart care, we have created designated Provider Care Teams.  These Care Teams include your primary Cardiologist (physician) and Advanced Practice Providers (APPs -  Physician Assistants and Nurse Practitioners) who all work together to provide you with the care you need, when you need it. You will need a follow up appointment in 6 months.  Please call our office 2 months in advance to schedule this appointment.  You may see Skeet Latch, MD or one of the following Advanced Practice Providers on your designated Care Team:   Kerin Ransom, PA-C Roby Lofts, [place] . Sande Rives, PA-C

## 2018-10-02 NOTE — Progress Notes (Signed)
Cardiology Office Note   Date:  10/02/2018   ID:  Donnie CoffinDilipkumar [name], DOB September 10, 1945, MRN [id number]  PCP:  Carlos PengSanders, Robyn, MD  Cardiologist:   Carlos Siiffany [place], MD   No chief complaint on file.    History of Present Illness: Carlos [name] is a 73 y.o. male with persistentl atrial fibrillation, hyperlipidemia, ASD s/p surgical repair, and mild ascending aorta (3.6 cm) aneurysm who presents for follow up.  He was initially seen 08/2015 due to L sided chest pain.  He was referred for ETT, which was negative for ischemia.  He was noted to have several PVCs but was asymptomatic.  Dr. Clelia [name] followed up with Dr. Allyne [name] on 05/22/17 due to dysphagia.  While there his heart rate was 130 bpm and he was in atrial fibrillation.  He was started on metoprolol and referred to cardiology.  Dr. Sherryll [name] had an echo 05/2017 that revealed LVEF 60-65%.  He was also started on Eliquis.     Since his last appointment Dr. Sherryll [name] saw Carlos ReiningKathryn Lawrence, DNP, for cardiac clearance.  He had L inguinal hernia repair 07/2018 with Dr. Corliss [name] that went well.  He has been feeling well.  He has occasional episodes of GERD.  Last night he had some left chest pressure when lying in bed.  He stopped taking his ranitidine due to the national recall and thinks this is what is going on.  He has no exertional chest pain.  He does some walking for exercise.  He has no chest pain or shortness of breath with this.  He also denies lower extremity edema, orthopnea, or PND.  Overall his diet has been pretty good.  He mostly bakes his meat and seems all his vegetables.  He very rarely has fried food and very limited red meat.  He eats no pork.  He started taking rosuvastatin in July and plans to follow-up with Dr. Allyne [name] next month.   Past Medical History:  Diagnosis Date  . Allergic rhinitis   . Atypical chest pain 09/16/2015  . Dysrhythmia    a-fib/flutter  . Elevated blood pressure 10/19/2015  . GERD (gastroesophageal reflux disease)   .  Hypertension   . Insomnia   . Left inguinal hernia   . Mixed hyperlipidemia   . PVC (premature ventricular contraction) 10/19/2015    Past Surgical History:  Procedure Laterality Date  . ASD REPAIR  1994  . INGUINAL HERNIA REPAIR  1994  . INGUINAL HERNIA REPAIR Left 09/03/2018   Procedure: LEFT INGUINAL HERNIA REPAIR WITH MESH;  Surgeon: Carlos [name], Matthew, MD;  Location: [facility];  Service: General;  Laterality: Left;     Current Outpatient Medications  Medication Sig Dispense Refill  . CALCIUM PO Take 1 tablet by mouth daily.     . Cholecalciferol (VITAMIN D PO) Take 1 capsule by mouth daily.     Marland Kitchen. ELIQUIS 5 MG TABS tablet Take 1 tablet by mouth twice daily 180 tablet 1  . MAGNESIUM PO Take by mouth.    . metoprolol tartrate (LOPRESSOR) 50 MG tablet Take 1 tablet by mouth twice daily 180 tablet 1  . Multiple Vitamin (MULTIVITAMIN) capsule Take 1 capsule by mouth daily.    . Omega-3 Fatty Acids (FISH OIL PO) Take 1 capsule by mouth daily.     . rosuvastatin (CRESTOR) 10 MG tablet Take 1 tablet (10 mg total) by mouth daily. 30 tablet 1  . zolpidem (AMBIEN) 10 MG tablet TAKE 1 TABLET BY MOUTH AT BEDTIME AS  NEEDED 30 tablet 2   No current facility-administered medications for this visit.     Allergies:   Patient has no known allergies.    Social History:  The patient  reports that he quit smoking about 12 years ago. His smoking use included cigarettes. He has a 4.50 pack-year smoking history. He has never used smokeless tobacco. He reports current alcohol use of about 1.0 standard drinks of alcohol per week. He reports that he does not use drugs.   Family History:  The patient's family history includes Healthy in his mother; Heart attack in his father; Pulmonary embolism in his brother.    ROS:  Please see the history of present illness.   Otherwise, review of systems are positive for none.   All other systems are reviewed and negative.    PHYSICAL EXAM: VS:  BP  118/74   Pulse 86   Temp 98.4 F (36.9 C) (Temporal)   Ht 5\' 10"  (1.778 m)   Wt 165 lb 6.4 oz (75 kg)   SpO2 98%   BMI 23.73 kg/m  , BMI Body mass index is 23.73 kg/m. GENERAL:  Well appearing HEENT: Pupils equal round and reactive, fundi not visualized, oral mucosa unremarkable NECK:  No jugular venous distention, waveform within normal limits, carotid upstroke brisk and symmetric, no bruits LUNGS:  Clear to auscultation bilaterally HEART:  Irregularly irregular.  PMI not displaced or sustained,S1 and S2 within normal limits, no S3, no S4, no clicks, no rubs, no murmurs ABD:  Flat, positive bowel sounds normal in frequency in pitch, no bruits, no rebound, no guarding, no midline pulsatile mass, no hepatomegaly, no splenomegaly EXT:  2 plus pulses throughout, no edema, no cyanosis no clubbing SKIN:  No rashes no nodules NEURO:  Cranial nerves II through XII grossly intact, motor grossly intact throughout PSYCH:  Cognitively intact, oriented to person place and time   EKG:  EKG is not ordered today. The ekg ordered 09/16/15 demonstrates sinus rhythm rate 66 bpm. 05/22/17: Atrial flutter.  Ventricular rate 130 bpm.   10/02/2018: Atrial flutter.  Rate 86 bpm.  ETT 10/07/15: Blood pressure demonstrated a normal response to exercise.  Upsloping ST segment depression ST segment depression was noted during stress in the V4, V5, V6, II, III and aVF leads.  No T wave inversion was noted during stress.  Negative, adequate stress test.   Echo 05/2017: Study Conclusions  - Left ventricle: The cavity size was normal. Systolic function was   normal. The estimated ejection fraction was in the range of 60%   to 65%. Wall motion was normal; there were no regional wall   motion abnormalities. - Ventricular septum: Septal motion showed paradox. - Mitral valve: There was mild to moderate regurgitation directed   centrally. - Left atrium: The atrium was mildly dilated. - Right ventricle: The  cavity size was moderately dilated. Wall   thickness was normal. - Right atrium: The atrium was mildly dilated. - Atrial septum: No defect or patent foramen ovale was identified. - Pulmonary arteries: PA peak pressure: 32 mm Hg (S).  Impressions:  - Atrial flutter with variable block and mild RVR during the entire   study.  Recent Labs: 07/24/2018: ALT 18; BUN 16; Creatinine, Ser 1.04; Hemoglobin 15.6; Platelets 303; Potassium 4.5; Sodium 141    Lipid Panel    Component Value Date/Time   CHOL 216 (H) 07/24/2018 0932   TRIG 233 (H) 07/24/2018 0932   HDL 35 (L) 07/24/2018 0932   CHOLHDL  6.2 (H) 07/24/2018 0932   LDLCALC 134 (H) 07/24/2018 0932      Wt Readings from Last 3 Encounters:  10/02/18 165 lb 6.4 oz (75 kg)  09/03/18 162 lb 7.7 oz (73.7 kg)  08/06/18 162 lb (73.5 kg)      ASSESSMENT AND PLAN:  Pantoprazole Follow-up 6 months  # Persistent atrial flutter: Mr. Branam is in atrial flutter.  His rates are well have been controlled.  He remains completely asymptomatic.  Continue Eliquis and metoprolol.  # Atypical chest pain:   Resolved.   ETT negative for ischemia.  # Hyperlipidemia: LDL was 134 on 07/2018.  He was started on rosuvastatin and will follow-up for repeat testing with Dr. Amparo Bristol.    Current medicines are reviewed at length with the patient today.  The patient does not have concerns regarding medicines.  The following changes have been made:  no change  Labs/ tests ordered today include:   No orders of the defined types were placed in this encounter.    Disposition:   FU with Armonii Sieh C. Oval Linsey, MD, [facility] in 6 months.      Signed, Brittlyn Cloe C. Oval Linsey, MD, [facility]  10/02/2018 11:01 AM    [place]

## 2018-10-13 ENCOUNTER — Ambulatory Visit (INDEPENDENT_AMBULATORY_CARE_PROVIDER_SITE_OTHER): Payer: Medicare Other | Admitting: Internal Medicine

## 2018-10-13 ENCOUNTER — Encounter: Payer: Self-pay | Admitting: Internal Medicine

## 2018-10-13 ENCOUNTER — Other Ambulatory Visit: Payer: Self-pay

## 2018-10-13 VITALS — BP 132/70 | HR 94 | Temp 98.0°F | Ht 68.4 in | Wt 163.4 lb

## 2018-10-13 DIAGNOSIS — I480 Paroxysmal atrial fibrillation: Secondary | ICD-10-CM

## 2018-10-13 DIAGNOSIS — K219 Gastro-esophageal reflux disease without esophagitis: Secondary | ICD-10-CM

## 2018-10-13 DIAGNOSIS — E7800 Pure hypercholesterolemia, unspecified: Secondary | ICD-10-CM

## 2018-10-13 DIAGNOSIS — Z79899 Other long term (current) drug therapy: Secondary | ICD-10-CM

## 2018-10-13 NOTE — Progress Notes (Signed)
Subjective:     Patient ID: Carlos [name] , male    DOB: Dec 01, 1945 , 73 y.o.   MRN: [id number]   Chief Complaint  Patient presents with  . Hyperlipidemia    HPI  He is here today for a cholesterol check. He was started on rosuvastatin at his last visit. He has not had any issues with the medication.   Hyperlipidemia This is a chronic problem. The problem is uncontrolled. Pertinent negatives include no chest pain, leg pain or myalgias. Current antihyperlipidemic treatment includes statins. The current treatment provides moderate improvement of lipids. Risk factors for coronary artery disease include male sex.     Past Medical History:  Diagnosis Date  . Allergic rhinitis   . Atypical chest pain 09/16/2015  . Dysrhythmia    a-fib/flutter  . Elevated blood pressure 10/19/2015  . GERD (gastroesophageal reflux disease)   . Hypertension   . Insomnia   . Left inguinal hernia   . Mixed hyperlipidemia   . Pure hypercholesterolemia 10/02/2018  . PVC (premature ventricular contraction) 10/19/2015  . Typical atrial flutter ([place]) 10/02/2018     Family History  Problem Relation Age of Onset  . Healthy Mother   . Heart attack Father   . Pulmonary embolism Brother      Current Outpatient Medications:  .  apixaban (ELIQUIS) 5 MG TABS tablet, Take 1 tablet (5 mg total) by mouth 2 (two) times daily., Disp: 180 tablet, Rfl: 1 .  CALCIUM PO, Take 1 tablet by mouth daily. , Disp: , Rfl:  .  Cholecalciferol (VITAMIN D PO), Take 1 capsule by mouth daily. , Disp: , Rfl:  .  MAGNESIUM PO, Take by mouth., Disp: , Rfl:  .  metoprolol tartrate (LOPRESSOR) 50 MG tablet, Take 1 tablet (50 mg total) by mouth 2 (two) times daily., Disp: 180 tablet, Rfl: 3 .  Multiple Vitamin (MULTIVITAMIN) capsule, Take 1 capsule by mouth daily., Disp: , Rfl:  .  Omega-3 Fatty Acids (FISH OIL PO), Take 1 capsule by mouth daily. , Disp: , Rfl:  .  pantoprazole (PROTONIX) 40 MG tablet, Take 1 tablet (40 mg total) by  mouth daily., Disp: 90 tablet, Rfl: 3 .  rosuvastatin (CRESTOR) 10 MG tablet, Take 1 tablet (10 mg total) by mouth daily., Disp: 30 tablet, Rfl: 1 .  zolpidem (AMBIEN) 10 MG tablet, TAKE 1 TABLET BY MOUTH AT BEDTIME AS NEEDED, Disp: 30 tablet, Rfl: 2   No Known Allergies   Review of Systems  Constitutional: Negative.   Respiratory: Negative.   Cardiovascular: Negative.  Negative for chest pain.  Gastrointestinal: Negative.   Musculoskeletal: Negative for myalgias.  Neurological: Negative.   Psychiatric/Behavioral: Negative.      Today's Vitals   10/13/18 1458  BP: 132/70  Pulse: 94  Temp: 98 F (36.7 C)  TempSrc: Oral  SpO2: 96%  Weight: 163 lb 6.4 oz (74.1 kg)  Height: 5' 8.4" (1.737 m)   Body mass index is 24.56 kg/m.   Objective:  Physical Exam Vitals signs and nursing note reviewed.  Constitutional:      Appearance: Normal appearance.  Cardiovascular:     Rate and Rhythm: Normal rate. Rhythm irregular.     Heart sounds: Normal heart sounds.  Pulmonary:     Effort: Pulmonary effort is normal.     Breath sounds: Normal breath sounds.  Abdominal:     Comments: He belches several times during exam.   Skin:    General: Skin is warm.  Neurological:  General: No focal deficit present.     Mental Status: He is alert.  Psychiatric:        Mood and Affect: Mood normal.         Assessment And Plan:     1. Pure hypercholesterolemia  Chronic. I will check non-fasting lipid panel today. Importance of statin compliance was discussed with the patient.   - Lipid panel  2. Gastroesophageal reflux disease without esophagitis  Chronic. He reports compliance with pantoprazole. He is encouraged to stop eating 3 hours prior to going to bed.   3. PAF (paroxysmal atrial fibrillation) (HCC)  Chronic, yet stable. He is rate controlled and asymptomatic.   4. Drug therapy  - Liver Profile      Gwynneth Alimentobyn N Pancho Rushing, MD    THE PATIENT IS ENCOURAGED TO PRACTICE SOCIAL  DISTANCING DUE TO THE COVID-19 PANDEMIC.

## 2018-10-13 NOTE — Patient Instructions (Signed)

## 2018-10-14 LAB — HEPATIC FUNCTION PANEL
ALT: 20 IU/L (ref 0–44)
AST: 18 IU/L (ref 0–40)
Albumin: 4.4 g/dL (ref 3.7–4.7)
Alkaline Phosphatase: 96 IU/L (ref 39–117)
Bilirubin Total: 0.7 mg/dL (ref 0.0–1.2)
Bilirubin, Direct: 0.23 mg/dL (ref 0.00–0.40)
Total Protein: 7.1 g/dL (ref 6.0–8.5)

## 2018-10-14 LAB — LIPID PANEL
Chol/HDL Ratio: 3 ratio (ref 0.0–5.0)
Cholesterol, Total: 130 mg/dL (ref 100–199)
HDL: 43 mg/dL (ref >39)
LDL Chol Calc (NIH): 59 mg/dL (ref 0–99)
Triglycerides: 168 mg/dL — ABNORMAL HIGH (ref 0–149)
VLDL Cholesterol Cal: 28 mg/dL (ref 5–40)

## 2018-10-27 ENCOUNTER — Other Ambulatory Visit: Payer: Self-pay | Admitting: Internal Medicine

## 2018-11-03 ENCOUNTER — Encounter: Payer: Self-pay | Admitting: Internal Medicine

## 2018-12-02 ENCOUNTER — Encounter: Payer: Self-pay | Admitting: Internal Medicine

## 2018-12-02 ENCOUNTER — Ambulatory Visit: Payer: Medicare Other | Admitting: Internal Medicine

## 2018-12-02 ENCOUNTER — Other Ambulatory Visit: Payer: Self-pay

## 2018-12-02 VITALS — BP 124/76 | HR 75 | Temp 98.2°F | Ht 68.4 in | Wt 171.6 lb

## 2018-12-02 DIAGNOSIS — F4321 Adjustment disorder with depressed mood: Secondary | ICD-10-CM

## 2018-12-02 DIAGNOSIS — Z634 Disappearance and death of family member: Secondary | ICD-10-CM | POA: Diagnosis not present

## 2018-12-02 MED ORDER — SERTRALINE HCL 25 MG PO TABS: ORAL_TABLET | ORAL | 0 refills | Status: DC

## 2018-12-02 NOTE — Progress Notes (Signed)
Subjective:     Patient ID: Carlos [name] , male    DOB: [birth year] , 73 y.o.   MRN: [id number]   Chief Complaint  Patient presents with  . Depression    patient stated he wanted to speak with you directly about why he is here.     HPI  He is here today for evaluation of depression. He reports that on [birth year] his birthday, he received a phone call from his older brother to wish him a happy birthday. Later that day, his other brother called him to state his brother (that made the phone call) was killed in a car accident.  He just returned from the funeral last night. His brother lived in [place]. He reports he is having difficulty dealing with his brother's death. He thinks he needs medication to help him through the grieving process.     Past Medical History:  Diagnosis Date  . Allergic rhinitis   . Atypical chest pain 09/16/2015  . Dysrhythmia    a-fib/flutter  . Elevated blood pressure 10/19/2015  . GERD (gastroesophageal reflux disease)   . Hypertension   . Insomnia   . Left inguinal hernia   . Mixed hyperlipidemia   . Pure hypercholesterolemia 10/02/2018  . PVC (premature ventricular contraction) 10/19/2015  . Typical atrial flutter (HCC) 10/02/2018     Family History  Problem Relation Age of Onset  . Healthy Mother   . Heart attack Father   . Pulmonary embolism Brother      Current Outpatient Medications:  .  apixaban (ELIQUIS) 5 MG TABS tablet, Take 1 tablet (5 mg total) by mouth 2 (two) times daily., Disp: 180 tablet, Rfl: 1 .  CALCIUM PO, Take 1 tablet by mouth daily. , Disp: , Rfl:  .  Cholecalciferol (VITAMIN D PO), Take 1 capsule by mouth daily. , Disp: , Rfl:  .  MAGNESIUM PO, Take by mouth., Disp: , Rfl:  .  metoprolol tartrate (LOPRESSOR) 50 MG tablet, Take 1 tablet (50 mg total) by mouth 2 (two) times daily., Disp: 180 tablet, Rfl: 3 .  Multiple Vitamin (MULTIVITAMIN) capsule, Take 1 capsule by mouth daily., Disp: , Rfl:  .  Omega-3 Fatty Acids (FISH  OIL PO), Take 1 capsule by mouth. EVERY OTHER DAY, Disp: , Rfl:  .  pantoprazole (PROTONIX) 40 MG tablet, Take 1 tablet (40 mg total) by mouth daily., Disp: 90 tablet, Rfl: 3 .  rosuvastatin (CRESTOR) 10 MG tablet, Take 1 tablet by mouth once daily, Disp: 90 tablet, Rfl: 0 .  zolpidem (AMBIEN) 10 MG tablet, TAKE 1 TABLET BY MOUTH AT BEDTIME AS NEEDED, Disp: 30 tablet, Rfl: 2 .  sertraline (ZOLOFT) 25 MG tablet, One tab po qd x 7 days, then take 2 tabs po qpm, Disp: 60 tablet, Rfl: 0   No Known Allergies   Review of Systems  Constitutional: Negative.   Respiratory: Negative.   Cardiovascular: Negative.   Gastrointestinal: Negative.   Neurological: Negative.   Psychiatric/Behavioral: Positive for dysphoric mood and sleep disturbance.     Today's Vitals   12/02/18 1447  BP: 124/76  Pulse: 75  Temp: 98.2 F (36.8 C)  TempSrc: Oral  Weight: 171 lb 9.6 oz (77.8 kg)  Height: 5' 8.4" (1.737 m)  PainSc: 0-No pain   Body mass index is 25.79 kg/m.   Objective:  Physical Exam Vitals signs and nursing note reviewed.  Constitutional:      Appearance: Normal appearance.  Cardiovascular:     Rate and  Rhythm: Normal rate and regular rhythm.     Heart sounds: Normal heart sounds.  Pulmonary:     Effort: Pulmonary effort is normal.     Breath sounds: Normal breath sounds.  Skin:    General: Skin is warm.  Neurological:     General: No focal deficit present.     Mental Status: He is alert.  Psychiatric:        Mood and Affect: Mood normal.         Assessment And Plan:     1. Grief reaction  He would like to start medication to help him through this period. I will send in rx sertraline 25mg  daily, then increase to 50mg  daily. He is advised to take in the evenings. Possible side effects were discussed with the patient in full detail. I will also refer him to Hospice for grief counseling. He is in agreement with his treatment plan. He will rto in four weeks for re-evaluation. All  questions were answered to his satisfaction.   - Ambulatory referral to Hospice        Maximino Greenland, MD    THE PATIENT IS ENCOURAGED TO PRACTICE SOCIAL DISTANCING DUE TO THE COVID-19 PANDEMIC.

## 2018-12-02 NOTE — Patient Instructions (Signed)
Adjustment Disorder, Adult °Adjustment disorder is a group of symptoms that can develop after a stressful life event, such as the loss of a job or serious physical illness. The symptoms can affect how you feel, think, and act. They may interfere with your relationships. °Adjustment disorder increases your risk of suicide and substance abuse. If this disorder is not managed early, it can develop into a more serious condition, such as major depressive disorder or post-traumatic stress disorder. °What are the causes? °This condition happens when you have trouble recovering from or coping with a stressful life event. °What increases the risk? °You are more likely to develop this condition if: °· You have had depression or anxiety. °· You are being treated for a long-term (chronic) illness. °· You are being treated for an illness that cannot be cured (terminal illness). °· You have a family history of mental illness. °What are the signs or symptoms? °Symptoms of this condition include: °· Extreme trouble doing daily tasks, such as going to work. °· Sadness, depression, or crying spells. °· Worrying a lot. °· Loss of enjoyment. °· Change in appetite or weight. °· Feelings of loss or hopelessness. °· Thoughts of suicide. °· Anxiety, worry, or nervousness. °· Trouble sleeping. °· Avoiding family and friends. °· Fighting or vandalism. °· Complaining of feeling sick without being ill. °· Feeling dazed or disconnected. °· Nightmares. °· Trouble sleeping. °· Irritability. °· Reckless driving. °· Poor work performance. °· Ignoring bills. °Symptoms of this condition start within three months of the stressful event. They do not last more than six months, unless the stressful circumstances last longer. Normal grieving after the death of a loved one is not a symptom of this condition. °How is this diagnosed? °To diagnose this condition, your health care provider will ask about what has happened in your life and how it has affected  you. He or she may also ask about your medical history and your use of medicines, alcohol, and other substances. Your health care provider may do a physical exam and order lab tests or other studies. You may be referred to a mental health specialist. °How is this treated? °Treatment options for this condition include: °· Counseling or talk therapy. Talk therapy is usually provided by mental health specialists. °· Medicines. Certain medicines may help with depression, anxiety, and sleep. °· Support groups. These offer emotional support, advice, and guidance. They are made up of people who have had similar experiences. °· Observation and time. This is sometimes called "watchful waiting." In this treatment, health care providers monitor your health and behavior without other treatment. Adjustment disorder sometimes gets better on its own with time. °Follow these instructions at home: °· Take over-the-counter and prescription medicines only as told by your health care provider. °· Keep all follow-up visits as told by your health care provider. This is important. °Contact a health care provider if: °· Your symptoms do not improve in six months. °· Your symptoms get worse. °Get help right away if: °· You have serious thoughts about hurting yourself or someone else. °If you ever feel like you may hurt yourself or others, or have thoughts about taking your own life, get help right away. You can go to your nearest emergency department or call: °· Your local emergency services (911 in the [place] °· A suicide crisis helpline, such as the National Suicide Prevention Lifeline at 1-800-273-8255. This is open 24 hours a day. °Summary °· Adjustment disorder is a group of symptoms that can develop   after a stressful life event, such as the loss of a job or serious physical illness. The symptoms can affect how you feel, think, and act. They may interfere with your relationships. °· Symptoms of this condition start within three months  of the stressful event. They do not last more than six months, unless the stressful circumstances last longer. °· Treatment may include talk therapy, medicines, participation in a support group, or observation to see if symptoms improve. °· Contact your health care provider if your symptoms get worse or do not improve in six months. °· If you ever feel like you may hurt yourself or others, or have thoughts about taking your own life, get help right away. °This information is not intended to replace advice given to you by your health care provider. Make sure you discuss any questions you have with your health care provider. °Document Released: 09/12/2005 Document Revised: 12/21/2016 Document Reviewed: 03/09/2016 °Elsevier Patient Education © 2020 [employer] ° °

## 2018-12-22 ENCOUNTER — Encounter: Payer: Self-pay | Admitting: Internal Medicine

## 2018-12-28 ENCOUNTER — Other Ambulatory Visit: Payer: Self-pay | Admitting: Internal Medicine

## 2018-12-29 NOTE — Telephone Encounter (Signed)
Zolpidem refill

## 2018-12-31 ENCOUNTER — Ambulatory Visit: Payer: Medicare Other | Admitting: Internal Medicine

## 2019-01-18 ENCOUNTER — Other Ambulatory Visit: Payer: Self-pay | Admitting: Internal Medicine

## 2019-01-29 ENCOUNTER — Ambulatory Visit: Payer: Medicare Other | Admitting: Internal Medicine

## 2019-01-29 ENCOUNTER — Ambulatory Visit: Payer: Medicare Other

## 2019-02-12 ENCOUNTER — Telehealth: Payer: Self-pay

## 2019-02-12 NOTE — Telephone Encounter (Signed)
The pt wanted to know if the office could make a call so that he can get a covid vaccination.  The pt was told that he would need to keep calling or he can get placed on the waiting list with [facility] and to sign up on line.

## 2019-02-19 ENCOUNTER — Ambulatory Visit (INDEPENDENT_AMBULATORY_CARE_PROVIDER_SITE_OTHER): Payer: Medicare PPO

## 2019-02-19 ENCOUNTER — Other Ambulatory Visit: Payer: Self-pay

## 2019-02-19 ENCOUNTER — Encounter: Payer: Self-pay | Admitting: Internal Medicine

## 2019-02-19 ENCOUNTER — Ambulatory Visit: Payer: Medicare PPO | Admitting: Internal Medicine

## 2019-02-19 VITALS — BP 128/78 | HR 92 | Temp 97.9°F | Ht 69.4 in | Wt 164.4 lb

## 2019-02-19 DIAGNOSIS — N5201 Erectile dysfunction due to arterial insufficiency: Secondary | ICD-10-CM

## 2019-02-19 DIAGNOSIS — G4709 Other insomnia: Secondary | ICD-10-CM

## 2019-02-19 DIAGNOSIS — Z0000 Encounter for general adult medical examination without abnormal findings: Secondary | ICD-10-CM | POA: Diagnosis not present

## 2019-02-19 DIAGNOSIS — Z79899 Other long term (current) drug therapy: Secondary | ICD-10-CM | POA: Diagnosis not present

## 2019-02-19 DIAGNOSIS — K219 Gastro-esophageal reflux disease without esophagitis: Secondary | ICD-10-CM | POA: Diagnosis not present

## 2019-02-19 DIAGNOSIS — I483 Typical atrial flutter: Secondary | ICD-10-CM

## 2019-02-19 DIAGNOSIS — E7800 Pure hypercholesterolemia, unspecified: Secondary | ICD-10-CM

## 2019-02-19 NOTE — Progress Notes (Signed)
This visit occurred during the SARS-CoV-2 public health emergency.  Safety protocols were in place, including screening questions prior to the visit, additional usage of staff PPE, and extensive cleaning of exam room while observing appropriate contact time as indicated for disinfecting solutions.  Subjective:   Carlos [name] is a 74 y.o. male who presents for Medicare Annual/Subsequent preventive examination.  Review of Systems:  n/a Cardiac Risk Factors include: advanced age (>84men, >71 women);hypertension;male gender     Objective:    Vitals: BP 128/78 (BP Location: Left Arm, Patient Position: Sitting, Cuff Size: Normal)   Pulse 92   Temp 97.9 F (36.6 C) (Oral)   Ht 5' 9.4" (1.763 m)   Wt 164 lb 6.4 oz (74.6 kg)   SpO2 97%   BMI 24.00 kg/m   Body mass index is 24 kg/m.  Advanced Directives 02/19/2019 09/03/2018 08/26/2018 07/24/2018 06/24/2018  Does Patient Have a Medical Advance Directive? Yes Yes Yes Yes Yes  Type of Estate agent of [place];Living will - Healthcare Power of [place];Living will Healthcare Power of [place];Living will Healthcare Power of Attorney  Does patient want to make changes to medical advance directive? - No - Patient declined No - Patient declined - -  Copy of Healthcare Power of Attorney in Chart? No - copy requested No - copy requested No - copy requested No - copy requested No - copy requested    Tobacco Social History   Tobacco Use  Smoking Status Former Smoker  . Packs/day: 0.50  . Years: 9.00  . Pack years: 4.50  . Types: Cigarettes  . Quit date: 01/22/2006  . Years since quitting: 13.0  Smokeless Tobacco Never Used     Counseling given: Not Answered   Clinical Intake:  Pre-visit preparation completed: Yes  Pain : No/denies pain     Nutritional Status: BMI of 19-24  Normal Nutritional Risks: None Diabetes: No  How often do you need to have someone help you when you read instructions, pamphlets, or other  written materials from your doctor or pharmacy?: 1 - Never What is the last grade level you completed in school?: pHd  Interpreter Needed?: No  Information entered by :: NAllen LPN  Past Medical History:  Diagnosis Date  . Allergic rhinitis   . Atypical chest pain 09/16/2015  . Dysrhythmia    a-fib/flutter  . Elevated blood pressure 10/19/2015  . GERD (gastroesophageal reflux disease)   . Hypertension   . Insomnia   . Left inguinal hernia   . Mixed hyperlipidemia   . Pure hypercholesterolemia 10/02/2018  . PVC (premature ventricular contraction) 10/19/2015  . Typical atrial flutter (HCC) 10/02/2018   Past Surgical History:  Procedure Laterality Date  . ASD REPAIR  1994  . INGUINAL HERNIA REPAIR  1994  . INGUINAL HERNIA REPAIR Left 09/03/2018   Procedure: LEFT INGUINAL HERNIA REPAIR WITH MESH;  Surgeon: Manus Rudd, MD;  Location: [facility];  Service: General;  Laterality: Left;   Family History  Problem Relation Age of Onset  . Healthy Mother   . Heart attack Father   . Pulmonary embolism Brother    Social History   Socioeconomic History  . Marital status: Divorced    Spouse name: Not on file  . Number of children: Not on file  . Years of education: Not on file  . Highest education level: Not on file  Occupational History  . Occupation: retired  Tobacco Use  . Smoking status: Former Smoker    Packs/day:  0.50    Years: 9.00    Pack years: 4.50    Types: Cigarettes    Quit date: 01/22/2006    Years since quitting: 13.0  . Smokeless tobacco: Never Used  Substance and Sexual Activity  . Alcohol use: Yes    Comment: 10-12 drinks per week  . Drug use: No  . Sexual activity: Not Currently  Other Topics Concern  . Not on file  Social History Narrative   Drinks 1-2 cups of caffeine daily.   Social Determinants of Health   Financial Resource Strain: Low Risk   . Difficulty of Paying Living Expenses: Not hard at all  Food Insecurity: No Food  Insecurity  . Worried About Charity fundraiser in the Last Year: Never true  . Ran Out of Food in the Last Year: Never true  Transportation Needs: Unmet Transportation Needs  . Lack of Transportation (Medical): Yes  . Lack of Transportation (Non-Medical): Yes  Physical Activity: Insufficiently Active  . Days of Exercise per Week: 3 days  . Minutes of Exercise per Session: 30 min  Stress: No Stress Concern Present  . Feeling of Stress : Only a little  Social Connections:   . Frequency of Communication with Friends and Family: Not on file  . Frequency of Social Gatherings with Friends and Family: Not on file  . Attends Religious Services: Not on file  . Active Member of Clubs or Organizations: Not on file  . Attends Archivist Meetings: Not on file  . Marital Status: Not on file    Outpatient Encounter Medications as of 02/19/2019  Medication Sig  . apixaban (ELIQUIS) 5 MG TABS tablet Take 1 tablet (5 mg total) by mouth 2 (two) times daily.  Marland Kitchen CALCIUM PO Take 1 tablet by mouth daily.   . Cholecalciferol (VITAMIN D PO) Take 1 capsule by mouth daily.   Marland Kitchen MAGNESIUM PO Take by mouth.  . metoprolol tartrate (LOPRESSOR) 50 MG tablet Take 1 tablet (50 mg total) by mouth 2 (two) times daily.  . Multiple Vitamin (MULTIVITAMIN) capsule Take 1 capsule by mouth daily.  . Omega-3 Fatty Acids (FISH OIL PO) Take 1 capsule by mouth. EVERY OTHER DAY  . pantoprazole (PROTONIX) 40 MG tablet Take 1 tablet (40 mg total) by mouth daily.  . rosuvastatin (CRESTOR) 10 MG tablet Take 1 tablet by mouth once daily  . zolpidem (AMBIEN) 10 MG tablet TAKE 1 TABLET BY MOUTH AT BEDTIME AS NEEDED  . sertraline (ZOLOFT) 25 MG tablet One tab po qd x 7 days, then take 2 tabs po qpm (Patient not taking: Reported on 02/19/2019)   No facility-administered encounter medications on file as of 02/19/2019.    Activities of Daily Living In your present state of health, do you have any difficulty performing the  following activities: 02/19/2019 09/03/2018  Hearing? N N  Vision? N N  Difficulty concentrating or making decisions? N N  Walking or climbing stairs? N N  Dressing or bathing? N N  Doing errands, shopping? N -  Preparing Food and eating ? N -  Using the Toilet? N -  In the past six months, have you accidently leaked urine? N -  Do you have problems with loss of bowel control? N -  Managing your Medications? N -  Managing your Finances? N -  Housekeeping or managing your Housekeeping? N -  Some recent data might be hidden    Patient Care Team: Glendale Chard, MD as PCP - General (  Internal Medicine) Chilton Si, MD as PCP - Cardiology (Cardiology)   Assessment:   This is a routine wellness examination for Carlos [name].  Exercise Activities and Dietary recommendations Current Exercise Habits: Home exercise routine, Type of exercise: walking, Time (Minutes): 30, Frequency (Times/Week): 3, Weekly Exercise (Minutes/Week): 90  Goals    . Patient Stated     No goals    . Patient Stated     02/19/2019, no goals       Fall Risk Fall Risk  02/19/2019 12/02/2018 10/13/2018 07/24/2018 07/24/2018  Falls in the past year? 0 0 0 0 0  Risk for fall due to : Medication side effect - - Medication side effect -  Follow up Falls evaluation completed;Education provided;Falls prevention discussed - - Falls evaluation completed;Education provided;Falls prevention discussed -   Is the patient's home free of loose throw rugs in walkways, pet beds, electrical cords, etc?   yes      Grab bars in the bathroom? no      Handrails on the stairs?   yes      Adequate lighting?   yes  Timed Get Up and Go Performed: n/a  Depression Screen PHQ 2/9 Scores 02/19/2019 12/02/2018 10/13/2018 07/24/2018  PHQ - 2 Score 0 6 0 0  PHQ- 9 Score 0 12 - -    Cognitive Function     6CIT Screen 02/19/2019 07/24/2018  What Year? 0 points 0 points  What month? 0 points 0 points  What time? 0 points 0 points  Count back  from 20 0 points 0 points  Months in reverse 0 points 0 points  Repeat phrase 2 points 0 points  Total Score 2 0    Immunization History  Administered Date(s) Administered  . Tdap 02/01/2018    Qualifies for Shingles Vaccine? yes  Screening Tests Health Maintenance  Topic Date Due  . INFLUENZA VACCINE  04/22/2019 (Originally 08/23/2018)  . COLONOSCOPY  09/28/2024  . TETANUS/TDAP  02/02/2028  . Hepatitis C Screening  Completed  . PNA vac Low Risk Adult  Completed   Cancer Screenings: Lung: Low Dose CT Chest recommended if Age 74-80 years, 30 pack-year currently smoking OR have quit w/in 15years. Patient does not qualify. Colorectal: up to date   Additional Screenings:  Hepatitis C Screening:01/2018      Plan:    Patient has no goals set at this time.  I have personally reviewed and noted the following in the patient's chart:   . Medical and social history . Use of alcohol, tobacco or illicit drugs  . Current medications and supplements . Functional ability and status . Nutritional status . Physical activity . Advanced directives . List of other physicians . Hospitalizations, surgeries, and ER visits in previous 12 months . Vitals . Screenings to include cognitive, depression, and falls . Referrals and appointments  In addition, I have reviewed and discussed with patient certain preventive protocols, quality metrics, and best practice recommendations. A written personalized care plan for preventive services as well as general preventive health recommendations were provided to patient.     Barb Merino, LPN  6/71/2458

## 2019-02-19 NOTE — Patient Instructions (Signed)
Carlos [name] , Thank you for taking time to come for your Medicare Wellness Visit. I appreciate your ongoing commitment to your health goals. Please review the following plan we discussed and let me know if I can assist you in the future.   Screening recommendations/referrals: Colonoscopy: 09/2014 Recommended yearly ophthalmology/optometry visit for glaucoma screening and checkup Recommended yearly dental visit for hygiene and checkup  Vaccinations: Influenza vaccine: declines Pneumococcal vaccine: 09/2011 Tdap vaccine: 01/2018 Shingles vaccine: discussed    Advanced directives: Please bring a copy of your POA (Power of [place]) and/or Living Will to your next appointment.    Conditions/risks identified: none  Next appointment: 07/30/2019 at 8:45  Preventive Care 65 Years and Older, Male Preventive care refers to lifestyle choices and visits with your health care provider that can promote health and wellness. What does preventive care include?  A yearly physical exam. This is also called an annual well check.  Dental exams once or twice a year.  Routine eye exams. Ask your health care provider how often you should have your eyes checked.  Personal lifestyle choices, including:  Daily care of your teeth and gums.  Regular physical activity.  Eating a healthy diet.  Avoiding tobacco and drug use.  Limiting alcohol use.  Practicing safe sex.  Taking low doses of aspirin every day.  Taking vitamin and mineral supplements as recommended by your health care provider. What happens during an annual well check? The services and screenings done by your health care provider during your annual well check will depend on your age, overall health, lifestyle risk factors, and family history of disease. Counseling  Your health care provider may ask you questions about your:  Alcohol use.  Tobacco use.  Drug use.  Emotional well-being.  Home and relationship well-being.  Sexual  activity.  Eating habits.  History of falls.  Memory and ability to understand (cognition).  Work and work Astronomer. Screening  You may have the following tests or measurements:  Height, weight, and BMI.  Blood pressure.  Lipid and cholesterol levels. These may be checked every 5 years, or more frequently if you are over 74 years old.  Skin check.  Lung cancer screening. You may have this screening every year starting at age 74 if you have a 30-pack-year history of smoking and currently smoke or have quit within the past 15 years.  Fecal occult blood test (FOBT) of the stool. You may have this test every year starting at age 74.  Flexible sigmoidoscopy or colonoscopy. You may have a sigmoidoscopy every 5 years or a colonoscopy every 10 years starting at age 74.  Prostate cancer screening. Recommendations will vary depending on your family history and other risks.  Hepatitis C blood test.  Hepatitis B blood test.  Sexually transmitted disease (STD) testing.  Diabetes screening. This is done by checking your blood sugar (glucose) after you have not eaten for a while (fasting). You may have this done every 1-3 years.  Abdominal aortic aneurysm (AAA) screening. You may need this if you are a current or former smoker.  Osteoporosis. You may be screened starting at age 22 if you are at high risk. Talk with your health care provider about your test results, treatment options, and if necessary, the need for more tests. Vaccines  Your health care provider may recommend certain vaccines, such as:  Influenza vaccine. This is recommended every year.  Tetanus, diphtheria, and acellular pertussis (Tdap, Td) vaccine. You may need a Td booster every  10 years.  Zoster vaccine. You may need this after age 74.  Pneumococcal 13-valent conjugate (PCV13) vaccine. One dose is recommended after age 74.  Pneumococcal polysaccharide (PPSV23) vaccine. One dose is recommended after age  87. Talk to your health care provider about which screenings and vaccines you need and how often you need them. This information is not intended to replace advice given to you by your health care provider. Make sure you discuss any questions you have with your health care provider. Document Released: 02/04/2015 Document Revised: 09/28/2015 Document Reviewed: 11/09/2014 Elsevier Interactive Patient Education  2017 [place] Prevention in the Home Falls can cause injuries. They can happen to people of all ages. There are many things you can do to make your home safe and to help prevent falls. What can I do on the outside of my home?  Regularly fix the edges of walkways and driveways and fix any cracks.  Remove anything that might make you trip as you walk through a door, such as a raised step or threshold.  Trim any bushes or trees on the path to your home.  Use bright outdoor lighting.  Clear any walking paths of anything that might make someone trip, such as rocks or tools.  Regularly check to see if handrails are loose or broken. Make sure that both sides of any steps have handrails.  Any raised decks and porches should have guardrails on the edges.  Have any leaves, snow, or ice cleared regularly.  Use sand or salt on walking paths during winter.  Clean up any spills in your garage right away. This includes oil or grease spills. What can I do in the bathroom?  Use night lights.  Install grab bars by the toilet and in the tub and shower. Do not use towel bars as grab bars.  Use non-skid mats or decals in the tub or shower.  If you need to sit down in the shower, use a plastic, non-slip stool.  Keep the floor dry. Clean up any water that spills on the floor as soon as it happens.  Remove soap buildup in the tub or shower regularly.  Attach bath mats securely with double-sided non-slip rug tape.  Do not have throw rugs and other things on the floor that can make  you trip. What can I do in the bedroom?  Use night lights.  Make sure that you have a light by your bed that is easy to reach.  Do not use any sheets or blankets that are too big for your bed. They should not hang down onto the floor.  Have a firm chair that has side arms. You can use this for support while you get dressed.  Do not have throw rugs and other things on the floor that can make you trip. What can I do in the kitchen?  Clean up any spills right away.  Avoid walking on wet floors.  Keep items that you use a lot in easy-to-reach places.  If you need to reach something above you, use a strong step stool that has a grab bar.  Keep electrical cords out of the way.  Do not use floor polish or wax that makes floors slippery. If you must use wax, use non-skid floor wax.  Do not have throw rugs and other things on the floor that can make you trip. What can I do with my stairs?  Do not leave any items on the stairs.  Make sure  that there are handrails on both sides of the stairs and use them. Fix handrails that are broken or loose. Make sure that handrails are as long as the stairways.  Check any carpeting to make sure that it is firmly attached to the stairs. Fix any carpet that is loose or worn.  Avoid having throw rugs at the top or bottom of the stairs. If you do have throw rugs, attach them to the floor with carpet tape.  Make sure that you have a light switch at the top of the stairs and the bottom of the stairs. If you do not have them, ask someone to add them for you. What else can I do to help prevent falls?  Wear shoes that:  Do not have high heels.  Have rubber bottoms.  Are comfortable and fit you well.  Are closed at the toe. Do not wear sandals.  If you use a stepladder:  Make sure that it is fully opened. Do not climb a closed stepladder.  Make sure that both sides of the stepladder are locked into place.  Ask someone to hold it for you, if  possible.  Clearly mark and make sure that you can see:  Any grab bars or handrails.  First and last steps.  Where the edge of each step is.  Use tools that help you move around (mobility aids) if they are needed. These include:  Canes.  Walkers.  Scooters.  Crutches.  Turn on the lights when you go into a dark area. Replace any light bulbs as soon as they burn out.  Set up your furniture so you have a clear path. Avoid moving your furniture around.  If any of your floors are uneven, fix them.  If there are any pets around you, be aware of where they are.  Review your medicines with your doctor. Some medicines can make you feel dizzy. This can increase your chance of falling. Ask your doctor what other things that you can do to help prevent falls. This information is not intended to replace advice given to you by your health care provider. Make sure you discuss any questions you have with your health care provider. Document Released: 11/04/2008 Document Revised: 06/16/2015 Document Reviewed: 02/12/2014 Elsevier Interactive Patient Education  2017 [employer].

## 2019-02-19 NOTE — Progress Notes (Signed)
This visit occurred during the SARS-CoV-2 public health emergency.  Safety protocols were in place, including screening questions prior to the visit, additional usage of staff PPE, and extensive cleaning of exam room while observing appropriate contact time as indicated for disinfecting solutions.  Subjective:     Patient ID: Carlos [name] , male    DOB: 09/19/1945 , 74 y.o.   MRN: [id number]   Chief Complaint  Patient presents with  . Hyperlipidemia    HPI  He is here today for a cholesterol check.  He reports compliance with meds. He denies having any side effects from the medication. He states he has been watching his diet.  He feels well and has no complaints.     Past Medical History:  Diagnosis Date  . Allergic rhinitis   . Atypical chest pain 09/16/2015  . Dysrhythmia    a-fib/flutter  . Elevated blood pressure 10/19/2015  . GERD (gastroesophageal reflux disease)   . Hypertension   . Insomnia   . Left inguinal hernia   . Mixed hyperlipidemia   . Pure hypercholesterolemia 10/02/2018  . PVC (premature ventricular contraction) 10/19/2015  . Typical atrial flutter (HCC) 10/02/2018     Family History  Problem Relation Age of Onset  . Healthy Mother   . Heart attack Father   . Pulmonary embolism Brother      Current Outpatient Medications:  .  apixaban (ELIQUIS) 5 MG TABS tablet, Take 1 tablet (5 mg total) by mouth 2 (two) times daily., Disp: 180 tablet, Rfl: 1 .  CALCIUM PO, Take 1 tablet by mouth daily. , Disp: , Rfl:  .  Cholecalciferol (VITAMIN D PO), Take 1 capsule by mouth daily. , Disp: , Rfl:  .  MAGNESIUM PO, Take by mouth., Disp: , Rfl:  .  metoprolol tartrate (LOPRESSOR) 50 MG tablet, Take 1 tablet (50 mg total) by mouth 2 (two) times daily., Disp: 180 tablet, Rfl: 3 .  Multiple Vitamin (MULTIVITAMIN) capsule, Take 1 capsule by mouth daily., Disp: , Rfl:  .  Omega-3 Fatty Acids (FISH OIL PO), Take 1 capsule by mouth. EVERY OTHER DAY, Disp: , Rfl:  .   pantoprazole (PROTONIX) 40 MG tablet, Take 1 tablet (40 mg total) by mouth daily., Disp: 90 tablet, Rfl: 3 .  rosuvastatin (CRESTOR) 10 MG tablet, Take 1 tablet by mouth once daily, Disp: 90 tablet, Rfl: 0 .  sertraline (ZOLOFT) 25 MG tablet, One tab po qd x 7 days, then take 2 tabs po qpm (Patient not taking: Reported on 02/19/2019), Disp: 60 tablet, Rfl: 0 .  zolpidem (AMBIEN) 10 MG tablet, TAKE 1 TABLET BY MOUTH AT BEDTIME AS NEEDED, Disp: 30 tablet, Rfl: 2   No Known Allergies   Review of Systems  Constitutional: Negative.   Respiratory: Negative.   Cardiovascular: Negative.   Gastrointestinal: Negative.   Genitourinary:       He is in a new relationship. He wants to try something for ED.  Neurological: Negative.   Psychiatric/Behavioral: Positive for sleep disturbance.       Unfortunately, he states he is still not sleeping well. He has been using zolpidem, 5mg  - this has been most effective. This is a long-standing issue. He has difficulty falling and staying asleep.      Today's Vitals   02/19/19 1440  BP: 128/78  Pulse: 92  Temp: 97.9 F (36.6 C)  Weight: 164 lb 6.4 oz (74.6 kg)  Height: 5' 9.4" (1.763 m)  PainSc: 0-No pain   Body  mass index is 24 kg/m.   Objective:  Physical Exam Vitals and nursing note reviewed.  Constitutional:      Appearance: Normal appearance.  Cardiovascular:     Rate and Rhythm: Normal rate. Rhythm irregular.     Heart sounds: Normal heart sounds.  Pulmonary:     Effort: Pulmonary effort is normal.     Breath sounds: Normal breath sounds.  Skin:    General: Skin is warm.  Neurological:     General: No focal deficit present.     Mental Status: He is alert.  Psychiatric:        Mood and Affect: Mood normal.         Assessment And Plan:     1. Pure hypercholesterolemia  Chronic. His previous labs from Sept 2020 were reviewed in full detail. He will continue with current meds.  I will check lipids at his next visit.   2. Typical  atrial flutter (HCC) Chronic, yet stable. He is rate-controlled. He is encouraged to continue with anticoagulant therapy.   3. Gastroesophageal reflux disease without esophagitis  Chronic, yet stable.   4. Other insomnia  Chronic. Risks associated with use of zolpidem in his demographic was discussed in full detail. He was given samples of Belsomra, 15mg  to try nightly prn. He is encouraged to practice good bedtime hygiene.   5. Erectile dysfunction due to arterial insufficiency  I will check on cost of compounded sildenafil. He understands this is for prn use.   6. Drug therapy  - CBC no Diff   Maximino Greenland, MD    THE PATIENT IS ENCOURAGED TO PRACTICE SOCIAL DISTANCING DUE TO THE COVID-19 PANDEMIC.

## 2019-02-19 NOTE — Patient Instructions (Signed)
COVID-19 Vaccine Information can be found at: [email or web address] For questions related to vaccine distribution or appointments, please email [email or web address] or call [phone].    

## 2019-02-20 LAB — CBC
Hematocrit: 50.1 % (ref 37.5–51.0)
Hemoglobin: 17 g/dL (ref 13.0–17.7)
MCH: 29.6 pg (ref 26.6–33.0)
MCHC: 33.9 g/dL (ref 31.5–35.7)
MCV: 87 fL (ref 79–97)
Platelets: 300 10*3/uL (ref 150–450)
RBC: 5.74 x10E6/uL (ref 4.14–5.80)
RDW: 13 % (ref 11.6–15.4)
WBC: 7.9 10*3/uL (ref 3.4–10.8)

## 2019-02-24 ENCOUNTER — Telehealth: Payer: Self-pay

## 2019-02-24 ENCOUNTER — Other Ambulatory Visit: Payer: Self-pay

## 2019-02-24 ENCOUNTER — Other Ambulatory Visit: Payer: Self-pay | Admitting: Internal Medicine

## 2019-02-24 MED ORDER — SILDENAFIL CITRATE 20 MG PO TABS: ORAL_TABLET | ORAL | 0 refills | Status: DC

## 2019-02-24 NOTE — Telephone Encounter (Signed)
I left the pt a message that Dr. Allyne Gee sent an rx of sildnafil to the pt's pharmacy and if it's too expensive to let the office know and that the rx will be send to [facility] pharmacy.

## 2019-02-25 ENCOUNTER — Telehealth: Payer: Self-pay

## 2019-02-25 NOTE — Telephone Encounter (Signed)
The pt called and said that his sildenafil prescription was $16 at [facility] and that he has picked it up.

## 2019-02-28 ENCOUNTER — Encounter: Payer: Self-pay | Admitting: Internal Medicine

## 2019-03-08 ENCOUNTER — Ambulatory Visit: Payer: Medicare PPO | Attending: Internal Medicine

## 2019-03-08 ENCOUNTER — Encounter: Payer: Self-pay | Admitting: Internal Medicine

## 2019-03-08 DIAGNOSIS — Z23 Encounter for immunization: Secondary | ICD-10-CM

## 2019-03-08 NOTE — Progress Notes (Signed)
   Covid-19 Vaccination Clinic  Name:  Carlos [name]    MRN: [id number] DOB: 1945/11/01  03/08/2019  Carlos [name] was observed post Covid-19 immunization for 15 minutes without incidence. He was provided with Vaccine Information Sheet and instruction to access the V-Safe system.   Carlos [name] was instructed to call 911 with any severe reactions post vaccine: Marland Kitchen Difficulty breathing  . Swelling of your face and throat  . A fast heartbeat  . A bad rash all over your body  . Dizziness and weakness    Immunizations Administered    Name Date Dose VIS Date Route   Pfizer COVID-19 Vaccine 03/08/2019  1:58 PM 0.3 mL 01/02/2019 Intramuscular   Manufacturer: [employer], [employer]   Lot: [id number]   NDC: [address]-7

## 2019-03-22 ENCOUNTER — Other Ambulatory Visit: Payer: Self-pay | Admitting: Internal Medicine

## 2019-03-23 NOTE — Telephone Encounter (Signed)
Ambien refill 

## 2019-03-27 ENCOUNTER — Other Ambulatory Visit: Payer: Self-pay

## 2019-03-27 MED ORDER — LEVOCETIRIZINE DIHYDROCHLORIDE 5 MG PO TABS: 5.0000 mg | ORAL_TABLET | Freq: Every evening | ORAL | 1 refills | Status: DC

## 2019-03-31 ENCOUNTER — Ambulatory Visit: Payer: Medicare PPO | Attending: Internal Medicine

## 2019-03-31 ENCOUNTER — Encounter: Payer: Self-pay | Admitting: Internal Medicine

## 2019-03-31 DIAGNOSIS — Z23 Encounter for immunization: Secondary | ICD-10-CM | POA: Insufficient documentation

## 2019-03-31 NOTE — Progress Notes (Signed)
   Covid-19 Vaccination Clinic  Name:  Carlos [name]    MRN: [id number] DOB: 09/10/1945  03/31/2019  [name] was observed post Covid-19 immunization for 15 minutes without incident. He was provided with Vaccine Information Sheet and instruction to access the V-Safe system.   [name] was instructed to call 911 with any severe reactions post vaccine: Marland Kitchen Difficulty breathing  . Swelling of face and throat  . A fast heartbeat  . A bad rash all over body  . Dizziness and weakness   Immunizations Administered    Name Date Dose VIS Date Route   Pfizer COVID-19 Vaccine 03/31/2019  2:24 PM 0.3 mL 01/02/2019 Intramuscular   Manufacturer: [employer], [employer]   Lot: [id number]   NDC: [address]-8

## 2019-04-01 ENCOUNTER — Ambulatory Visit: Payer: Medicare PPO

## 2019-04-14 ENCOUNTER — Ambulatory Visit: Payer: Medicare PPO | Admitting: Internal Medicine

## 2019-04-14 ENCOUNTER — Other Ambulatory Visit: Payer: Self-pay

## 2019-04-14 ENCOUNTER — Encounter: Payer: Self-pay | Admitting: Internal Medicine

## 2019-04-14 VITALS — BP 110/66 | HR 65 | Temp 97.8°F | Ht 69.0 in | Wt 163.6 lb

## 2019-04-14 DIAGNOSIS — K921 Melena: Secondary | ICD-10-CM

## 2019-04-14 DIAGNOSIS — K644 Residual hemorrhoidal skin tags: Secondary | ICD-10-CM | POA: Diagnosis not present

## 2019-04-14 LAB — CBC
Hematocrit: 50 % (ref 37.5–51.0)
Hemoglobin: 16.6 g/dL (ref 13.0–17.7)
MCH: 29.5 pg (ref 26.6–33.0)
MCHC: 33.2 g/dL (ref 31.5–35.7)
MCV: 89 fL (ref 79–97)
Platelets: 308 10*3/uL (ref 150–450)
RBC: 5.63 x10E6/uL (ref 4.14–5.80)
RDW: 13.2 % (ref 11.6–15.4)
WBC: 7.2 10*3/uL (ref 3.4–10.8)

## 2019-04-14 LAB — POC HEMOCCULT BLD/STL (OFFICE/1-CARD/DIAGNOSTIC): Fecal Occult Blood, POC: POSITIVE — AB

## 2019-04-14 NOTE — Progress Notes (Signed)
This visit occurred during the SARS-CoV-2 public health emergency.  Safety protocols were in place, including screening questions prior to the visit, additional usage of staff PPE, and extensive cleaning of exam room while observing appropriate contact time as indicated for disinfecting solutions.  Subjective:     Patient ID: Carlos [name] , male    DOB: 1945/03/01 , 74 y.o.   MRN: [id number]   Chief Complaint  Patient presents with  . Blood In Stools    HPI  He is here today for further evaluation of blood in stools. He first noticed this on Sunday. He reports seeing blood in the toilet.  He denies current bout with hemorrhoids.  States "I have had to push harder lately".  He has not tried a stool softener. He had recurrent symptoms Monday and Tuesday. There is some associated abdominal discomfort. Denies h/o diverticulosis/itis.     Past Medical History:  Diagnosis Date  . Allergic rhinitis   . Atypical chest pain 09/16/2015  . Dysrhythmia    a-fib/flutter  . Elevated blood pressure 10/19/2015  . GERD (gastroesophageal reflux disease)   . Hypertension   . Insomnia   . Left inguinal hernia   . Mixed hyperlipidemia   . Pure hypercholesterolemia 10/02/2018  . PVC (premature ventricular contraction) 10/19/2015  . Typical atrial flutter ([place]) 10/02/2018     Family History  Problem Relation Age of Onset  . Healthy Mother   . Heart attack Father   . Pulmonary embolism Brother      Current Outpatient Medications:  .  apixaban (ELIQUIS) 5 MG TABS tablet, Take 1 tablet (5 mg total) by mouth 2 (two) times daily., Disp: 180 tablet, Rfl: 1 .  CALCIUM PO, Take 1 tablet by mouth daily. 600 mg daily, Disp: , Rfl:  .  Cholecalciferol (VITAMIN D PO), Take 1 capsule by mouth daily. , Disp: , Rfl:  .  levocetirizine (XYZAL) 5 MG tablet, Take 1 tablet (5 mg total) by mouth every evening., Disp: 90 tablet, Rfl: 1 .  MAGNESIUM PO, Take by mouth., Disp: , Rfl:  .  metoprolol tartrate  (LOPRESSOR) 50 MG tablet, Take 1 tablet (50 mg total) by mouth 2 (two) times daily., Disp: 180 tablet, Rfl: 3 .  Multiple Vitamin (MULTIVITAMIN) capsule, Take 1 capsule by mouth daily., Disp: , Rfl:  .  Omega-3 Fatty Acids (FISH OIL PO), Take 1 capsule by mouth. EVERY OTHER DAY, Disp: , Rfl:  .  pantoprazole (PROTONIX) 40 MG tablet, Take 1 tablet (40 mg total) by mouth daily., Disp: 90 tablet, Rfl: 3 .  rosuvastatin (CRESTOR) 10 MG tablet, Take 1 tablet by mouth once daily, Disp: 90 tablet, Rfl: 0 .  sildenafil (REVATIO) 20 MG tablet, One tab po qd prn, Disp: 20 tablet, Rfl: 0 .  zolpidem (AMBIEN) 10 MG tablet, TAKE 1 TABLET BY MOUTH AT BEDTIME AS NEEDED, Disp: 30 tablet, Rfl: 2   No Known Allergies   Review of Systems  Constitutional: Negative.   Respiratory: Negative.   Cardiovascular: Negative.   Gastrointestinal: Negative.   Neurological: Negative.   Psychiatric/Behavioral: Negative.      Today's Vitals   04/14/19 1040  BP: 110/66  Pulse: 65  Temp: 97.8 F (36.6 C)  TempSrc: Oral  Weight: 163 lb 9.6 oz (74.2 kg)  Height: 5\' 9"  (1.753 m)  PainSc: 0-No pain   Body mass index is 24.16 kg/m.   Objective:  Physical Exam Vitals and nursing note reviewed.  Constitutional:  Appearance: Normal appearance.  Cardiovascular:     Rate and Rhythm: Normal rate and regular rhythm.     Heart sounds: Normal heart sounds.  Pulmonary:     Effort: Pulmonary effort is normal.     Breath sounds: Normal breath sounds.  Abdominal:     General: Abdomen is flat. Bowel sounds are normal.     Palpations: Abdomen is soft.  Genitourinary:    Rectum: Guaiac result positive. External hemorrhoid present. No tenderness.  Skin:    General: Skin is warm.  Neurological:     General: No focal deficit present.     Mental Status: He is alert.  Psychiatric:        Mood and Affect: Mood normal.         Assessment And Plan:     1. Blood in stool  I will check blood count today. Stool is  heme positive. I will refer him to GI for further evaluation. Last colonoscopy was in Sept 2016. He was originally due again in 2023, per GI.   - Ambulatory referral to Gastroenterology - CBC no Diff - POC Hemoccult Bld/Stl (1-Cd Office Dx)     [name], MD    THE PATIENT IS ENCOURAGED TO PRACTICE SOCIAL DISTANCING DUE TO THE COVID-19 PANDEMIC.

## 2019-04-15 ENCOUNTER — Encounter: Payer: Self-pay | Admitting: Internal Medicine

## 2019-04-16 ENCOUNTER — Ambulatory Visit (INDEPENDENT_AMBULATORY_CARE_PROVIDER_SITE_OTHER): Payer: Medicare PPO | Admitting: Cardiovascular Disease

## 2019-04-16 ENCOUNTER — Encounter: Payer: Self-pay | Admitting: Cardiovascular Disease

## 2019-04-16 ENCOUNTER — Encounter: Payer: Self-pay | Admitting: Internal Medicine

## 2019-04-16 ENCOUNTER — Other Ambulatory Visit: Payer: Self-pay

## 2019-04-16 VITALS — BP 110/70 | HR 87 | Ht 70.0 in | Wt 167.0 lb

## 2019-04-16 DIAGNOSIS — E7800 Pure hypercholesterolemia, unspecified: Secondary | ICD-10-CM

## 2019-04-16 DIAGNOSIS — I483 Typical atrial flutter: Secondary | ICD-10-CM | POA: Diagnosis not present

## 2019-04-16 DIAGNOSIS — K5900 Constipation, unspecified: Secondary | ICD-10-CM | POA: Diagnosis not present

## 2019-04-16 DIAGNOSIS — K219 Gastro-esophageal reflux disease without esophagitis: Secondary | ICD-10-CM | POA: Diagnosis not present

## 2019-04-16 DIAGNOSIS — R0789 Other chest pain: Secondary | ICD-10-CM

## 2019-04-16 DIAGNOSIS — K625 Hemorrhage of anus and rectum: Secondary | ICD-10-CM | POA: Diagnosis not present

## 2019-04-16 DIAGNOSIS — I493 Ventricular premature depolarization: Secondary | ICD-10-CM

## 2019-04-16 DIAGNOSIS — K5730 Diverticulosis of large intestine without perforation or abscess without bleeding: Secondary | ICD-10-CM | POA: Diagnosis not present

## 2019-04-16 DIAGNOSIS — Z1211 Encounter for screening for malignant neoplasm of colon: Secondary | ICD-10-CM | POA: Diagnosis not present

## 2019-04-16 NOTE — Progress Notes (Signed)
Cardiology Office Note   Date:  04/16/2019   ID:  Carlos [name], DOB 08/17/1945, MRN [id number]  PCP:  Dorothyann Peng, MD  Cardiologist:   Chilton Si, MD   No chief complaint on file.    History of Present Illness: Carlos [name] is a 74 y.o. male with persistentl atrial fibrillation, hyperlipidemia, ASD s/p surgical repair, and mild ascending aorta (3.6 cm) aneurysm who presents for follow up.  He was initially seen 08/2015 due to L sided chest pain.  He was referred for ETT, which was negative for ischemia.  He was noted to have several PVCs but was asymptomatic.  Dr. Clelia Croft followed up with Dr. Allyne Gee on 05/22/17 due to dysphagia.  While there his heart rate was 130 bpm and he was in atrial fibrillation.  He was started on metoprolol and referred to cardiology.  Dr. Sherryll Burger had an echo 05/2017 that revealed LVEF 60-65%.  He was also started on Eliquis.     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  He had L inguinal hernia repair 07/2018 with Dr. Corliss Skains that went well.  He has been feeling well.  He has occasional episodes of GERD.  At his last appointment he was started on pantoprazole.  He is unsure whether this helps.  He only uses it when he has symptoms.  He notes that sometimes he gets pressure in his chest after eating when he lays down in bed.  He has no exertional symptoms.  He continues to walk daily and feels well.  He denies chest pain or shortness of.  He has no lower extremity edema, orthopnea, or PND.  He has experienced a couple episodes of blood in his stool.  He saw Dr. Allyne Gee and his hemorrhoids were okay but he was noted to have some anal fissures.  He is scheduled to see Dr. Loreta Ave later this week.  He denies melena.   Past Medical History:  Diagnosis Date  . Allergic rhinitis   . Atypical chest pain 09/16/2015  . Dysrhythmia    a-fib/flutter  . Elevated blood pressure 10/19/2015  . GERD (gastroesophageal reflux disease)   . Hypertension   .  Insomnia   . Left inguinal hernia   . Mixed hyperlipidemia   . Pure hypercholesterolemia 10/02/2018  . PVC (premature ventricular contraction) 10/19/2015  . Typical atrial flutter (HCC) 10/02/2018    Past Surgical History:  Procedure Laterality Date  . ASD REPAIR  1994  . INGUINAL HERNIA REPAIR  1994  . INGUINAL HERNIA REPAIR Left 09/03/2018   Procedure: LEFT INGUINAL HERNIA REPAIR WITH MESH;  Surgeon: Manus Rudd, MD;  Location: [facility];  Service: General;  Laterality: Left;     Current Outpatient Medications  Medication Sig Dispense Refill  . apixaban (ELIQUIS) 5 MG TABS tablet Take 1 tablet (5 mg total) by mouth 2 (two) times daily. 180 tablet 1  . CALCIUM PO Take 1 tablet by mouth daily. 600 mg daily    . Cholecalciferol (VITAMIN D PO) Take 1 capsule by mouth daily.     Marland Kitchen levocetirizine (XYZAL) 5 MG tablet Take 1 tablet (5 mg total) by mouth every evening. 90 tablet 1  . MAGNESIUM PO Take by mouth.    . metoprolol tartrate (LOPRESSOR) 50 MG tablet Take 1 tablet (50 mg total) by mouth 2 (two) times daily. 180 tablet 3  . Multiple Vitamin (MULTIVITAMIN) capsule Take 1 capsule by mouth daily.    . Omega-3 Fatty Acids (FISH  OIL PO) Take 1 capsule by mouth. EVERY OTHER DAY    . pantoprazole (PROTONIX) 40 MG tablet Take 1 tablet (40 mg total) by mouth daily. 90 tablet 3  . rosuvastatin (CRESTOR) 10 MG tablet Take 1 tablet by mouth once daily 90 tablet 0  . sildenafil (REVATIO) 20 MG tablet One tab po qd prn 20 tablet 0  . zolpidem (AMBIEN) 10 MG tablet TAKE 1 TABLET BY MOUTH AT BEDTIME AS NEEDED 30 tablet 2   No current facility-administered medications for this visit.    Allergies:   Patient has no known allergies.    Social History:  The patient  reports that he quit smoking about 13 years ago. His smoking use included cigarettes. He has a 4.50 pack-year smoking history. He has never used smokeless tobacco. He reports current alcohol use. He reports that he does  not use drugs.   Family History:  The patient's family history includes Healthy in his mother; Heart attack in his father; Pulmonary embolism in his brother.    ROS:  Please see the history of present illness.   Otherwise, review of systems are positive for none.   All other systems are reviewed and negative.    PHYSICAL EXAM: VS:  BP 110/70   Pulse 87   Ht 5\' 10"  (1.778 m)   Wt 167 lb (75.8 kg)   SpO2 96%   BMI 23.96 kg/m  , BMI Body mass index is 23.96 kg/m. GENERAL:  Well appearing HEENT: Pupils equal round and reactive, fundi not visualized, oral mucosa unremarkable NECK:  No jugular venous distention, waveform within normal limits, carotid upstroke brisk and symmetric, no bruits LUNGS:  Clear to auscultation bilaterally HEART:  Irregularly irregular.  PMI not displaced or sustained,S1 and S2 within normal limits, no S3, no S4, no clicks, no rubs, no murmurs ABD:  Flat, positive bowel sounds normal in frequency in pitch, no bruits, no rebound, no guarding, no midline pulsatile mass, no hepatomegaly, no splenomegaly EXT:  2 plus pulses throughout, no edema, no cyanosis no clubbing SKIN:  No rashes no nodules NEURO:  Cranial nerves II through XII grossly intact, motor grossly intact throughout PSYCH:  Cognitively intact, oriented to person place and time   EKG:  EKG is ordered today. The ekg ordered 09/16/15 demonstrates sinus rhythm rate 66 bpm. 05/22/17: Atrial flutter.  Ventricular rate 130 bpm.   10/02/2018: Atrial flutter.  Rate 86 bpm. 04/16/2019: Atrial flutter.  Rate 87 bpm.  PVC.  ETT 10/07/15: Blood pressure demonstrated a normal response to exercise.  Upsloping ST segment depression ST segment depression was noted during stress in the V4, V5, V6, II, III and aVF leads.  No T wave inversion was noted during stress.  Negative, adequate stress test.   Echo 05/2017: Study Conclusions  - Left ventricle: The cavity size was normal. Systolic function was   normal. The  estimated ejection fraction was in the range of 60%   to 65%. Wall motion was normal; there were no regional wall   motion abnormalities. - Ventricular septum: Septal motion showed paradox. - Mitral valve: There was mild to moderate regurgitation directed   centrally. - Left atrium: The atrium was mildly dilated. - Right ventricle: The cavity size was moderately dilated. Wall   thickness was normal. - Right atrium: The atrium was mildly dilated. - Atrial septum: No defect or patent foramen ovale was identified. - Pulmonary arteries: PA peak pressure: 32 mm Hg (S).  Impressions:  - Atrial flutter  with variable block and mild RVR during the entire   study.  Recent Labs: 07/24/2018: BUN 16; Creatinine, Ser 1.04; Potassium 4.5; Sodium 141 10/13/2018: ALT 20 04/14/2019: Hemoglobin 16.6; Platelets 308    Lipid Panel    Component Value Date/Time   CHOL 130 10/13/2018 1545   TRIG 168 (H) 10/13/2018 1545   HDL 43 10/13/2018 1545   CHOLHDL 3.0 10/13/2018 1545   LDLCALC 59 10/13/2018 1545      Wt Readings from Last 3 Encounters:  04/16/19 167 lb (75.8 kg)  04/14/19 163 lb 9.6 oz (74.2 kg)  02/19/19 164 lb 6.4 oz (74.6 kg)      ASSESSMENT AND PLAN:   # Persistent atrial flutter: Mr. Corsino is remains in atrial flutter.  The rates are well-controlled and he is completely asymptomatic.  Continue metoprolol and Eliquis.  # Atypical chest pain:   Resolved.   ETT negative for ischemia.  # GERD: Recommended that he start taking pantoprazole daily.  I suspect that his chest pain lying down is actually attributable to GERD.  # Hyperlipidemia: Lipids are now well-controlled on rosuvastatin.    Current medicines are reviewed at length with the patient today.  The patient does not have concerns regarding medicines.  The following changes have been made:  no change  Labs/ tests ordered today include:   No orders of the defined types were placed in this encounter.    Disposition:    FU with Kelcey Korus C. Duke Salvia, MD, [facility] in 6 months.      Signed, Adyn Serna C. Duke Salvia, MD, [facility]  04/16/2019 3:00 PM    [facility]

## 2019-04-16 NOTE — Patient Instructions (Signed)
Medication Instructions:  Your physician recommends that you continue on your current medications as directed. Please refer to the Current Medication list given to you today.  *If you need a refill on your cardiac medications before your next appointment, please call your pharmacy*  Lab Work: NONE   Testing/Procedures: NONE   Follow-Up: At [facility], you and your health needs are our priority.  As part of our continuing mission to provide you with exceptional heart care, we have created designated Provider Care Teams.  These Care Teams include your primary Cardiologist (physician) and Advanced Practice Providers (APPs -  Physician Assistants and Nurse Practitioners) who all work together to provide you with the care you need, when you need it.  We recommend signing up for the patient portal called "MyChart".  Sign up information is provided on this After Visit Summary.  MyChart is used to connect with patients for Virtual Visits (Telemedicine).  Patients are able to view lab/test results, encounter notes, upcoming appointments, etc.  Non-urgent messages can be sent to your provider as well.   To learn more about what you can do with MyChart, go to [email or web address].    Your next appointment:   6 month(s)  You will receive a reminder letter in the mail two months in advance. If you don't receive a letter, please call our office to schedule the follow-up appointment.  The format for your next appointment:   In Person  Provider:   You may see Tiffany Mercedes, MD or one of the following Advanced Practice Providers on your designated Care Team:    Luke Kilroy, PA-C  Callie Goodrich, PA-C  Jesse Cleaver, FNP     

## 2019-04-17 ENCOUNTER — Encounter: Payer: Self-pay | Admitting: Internal Medicine

## 2019-04-17 DIAGNOSIS — H5203 Hypermetropia, bilateral: Secondary | ICD-10-CM | POA: Diagnosis not present

## 2019-04-17 DIAGNOSIS — H40013 Open angle with borderline findings, low risk, bilateral: Secondary | ICD-10-CM | POA: Diagnosis not present

## 2019-04-17 DIAGNOSIS — H2513 Age-related nuclear cataract, bilateral: Secondary | ICD-10-CM | POA: Diagnosis not present

## 2019-04-17 LAB — TSH: TSH: 2.25 (ref <=5.90)

## 2019-04-20 ENCOUNTER — Encounter: Payer: Self-pay | Admitting: Internal Medicine

## 2019-04-20 DIAGNOSIS — K5730 Diverticulosis of large intestine without perforation or abscess without bleeding: Secondary | ICD-10-CM | POA: Diagnosis not present

## 2019-04-20 DIAGNOSIS — Z1211 Encounter for screening for malignant neoplasm of colon: Secondary | ICD-10-CM | POA: Diagnosis not present

## 2019-04-20 DIAGNOSIS — K625 Hemorrhage of anus and rectum: Secondary | ICD-10-CM | POA: Diagnosis not present

## 2019-04-20 LAB — HM COLONOSCOPY

## 2019-04-21 ENCOUNTER — Encounter: Payer: Self-pay | Admitting: Internal Medicine

## 2019-04-27 ENCOUNTER — Other Ambulatory Visit: Payer: Self-pay | Admitting: Internal Medicine

## 2019-06-10 ENCOUNTER — Telehealth: Payer: Self-pay | Admitting: *Deleted

## 2019-06-10 NOTE — Telephone Encounter (Signed)
Per mychart message patient is requesting Rx for Pantoprazole 40 mg twice a day.  He increase on his own to BID and seems to be helping much better Will forward to Dr Duke Salvia for review

## 2019-06-15 NOTE — Telephone Encounter (Signed)
Glad he is doing better.  OK to increase.

## 2019-06-16 MED ORDER — PANTOPRAZOLE SODIUM 40 MG PO TBEC: 40.0000 mg | DELAYED_RELEASE_TABLET | Freq: Every day | ORAL | 3 refills | Status: DC

## 2019-06-16 MED ORDER — PANTOPRAZOLE SODIUM 40 MG PO TBEC: 40.0000 mg | DELAYED_RELEASE_TABLET | Freq: Two times a day (BID) | ORAL | 3 refills | Status: DC

## 2019-06-16 NOTE — Addendum Note (Signed)
Addended by: Regis Bill B on: 06/16/2019 06:00 PM   Modules accepted: Orders

## 2019-06-16 NOTE — Telephone Encounter (Signed)
Patient requesting to speak with Juliette Alcide again.

## 2019-06-16 NOTE — Telephone Encounter (Signed)
Discussed further with patient, no further questions

## 2019-06-16 NOTE — Telephone Encounter (Signed)
Advised patient, verbalized understanding  

## 2019-06-23 ENCOUNTER — Ambulatory Visit (INDEPENDENT_AMBULATORY_CARE_PROVIDER_SITE_OTHER): Payer: Medicare PPO | Admitting: Internal Medicine

## 2019-06-23 ENCOUNTER — Other Ambulatory Visit: Payer: Self-pay

## 2019-06-23 ENCOUNTER — Encounter: Payer: Self-pay | Admitting: Internal Medicine

## 2019-06-23 VITALS — BP 124/78 | HR 84 | Temp 97.5°F | Ht 70.0 in | Wt 166.0 lb

## 2019-06-23 DIAGNOSIS — R059 Cough, unspecified: Secondary | ICD-10-CM

## 2019-06-23 DIAGNOSIS — R05 Cough: Secondary | ICD-10-CM | POA: Diagnosis not present

## 2019-06-23 DIAGNOSIS — R350 Frequency of micturition: Secondary | ICD-10-CM | POA: Diagnosis not present

## 2019-06-23 DIAGNOSIS — R102 Pelvic and perineal pain unspecified side: Secondary | ICD-10-CM

## 2019-06-23 DIAGNOSIS — K219 Gastro-esophageal reflux disease without esophagitis: Secondary | ICD-10-CM

## 2019-06-23 LAB — POCT URINALYSIS DIPSTICK
Bilirubin, UA: NEGATIVE
Blood, UA: NEGATIVE
Glucose, UA: NEGATIVE
Ketones, UA: NEGATIVE
Leukocytes, UA: NEGATIVE
Nitrite, UA: NEGATIVE
Protein, UA: NEGATIVE
Spec Grav, UA: >=1.03 — AB (ref 1.010–1.025)
Urobilinogen, UA: 0.2 E.U./dL
pH, UA: 6 (ref 5.0–8.0)

## 2019-06-23 NOTE — Progress Notes (Signed)
This visit occurred during the SARS-CoV-2 public health emergency.  Safety protocols were in place, including screening questions prior to the visit, additional usage of staff PPE, and extensive cleaning of exam room while observing appropriate contact time as indicated for disinfecting solutions.  Subjective:     Patient ID: Carlos [name] , male    DOB: 29-Sep-1945 , 74 y.o.   MRN: [id number]   Chief Complaint  Patient presents with  . Abdominal Pain    HPI  He is  Here today for further evaluation of abdominal discomfort. He reports his sx started about a month or so ago.  He reports he develops lower abdominal pain after dinner. Described as dull, aching pain. No associated nausea, vomiting, fever, chills or diarrhea.  He states shortly after he has left-sided chest pain. Once he has bowel movement, the chest pain resolves. He is also concerned that he is now having a second bowel movement in the day, around 11pm. He is not used to this- wants to make sure it is okay to have a second bowel movement. He also mentions having urinary frequency. No dysuria. He is already taking pantoprazole twice daily.   Abdominal Pain This is a recurrent problem. The current episode started more than 1 month ago. The onset quality is gradual. The problem occurs daily. The problem has been unchanged. The pain is located in the RLQ and LLQ. The pain is at a severity of 3/10. The quality of the pain is dull and aching. The abdominal pain does not radiate. Associated symptoms include belching and frequency. Pertinent negatives include no dysuria, myalgias, nausea or vomiting. Nothing aggravates the pain. The pain is relieved by bowel movements. The treatment provided moderate relief. Prior diagnostic workup includes GI consult. There is no history of abdominal surgery or colon cancer.     Past Medical History:  Diagnosis Date  . Allergic rhinitis   . Atypical chest pain 09/16/2015  . Dysrhythmia     a-fib/flutter  . Elevated blood pressure 10/19/2015  . GERD (gastroesophageal reflux disease)   . Hypertension   . Insomnia   . Left inguinal hernia   . Mixed hyperlipidemia   . Pure hypercholesterolemia 10/02/2018  . PVC (premature ventricular contraction) 10/19/2015  . Typical atrial flutter (HCC) 10/02/2018     Family History  Problem Relation Age of Onset  . Healthy Mother   . Heart attack Father   . Pulmonary embolism Brother      Current Outpatient Medications:  .  apixaban (ELIQUIS) 5 MG TABS tablet, Take 1 tablet (5 mg total) by mouth 2 (two) times daily., Disp: 180 tablet, Rfl: 1 .  CALCIUM PO, Take 1 tablet by mouth daily. 600 mg daily, Disp: , Rfl:  .  Cholecalciferol (VITAMIN D PO), Take 1 capsule by mouth daily. , Disp: , Rfl:  .  MAGNESIUM PO, Take by mouth., Disp: , Rfl:  .  metoprolol tartrate (LOPRESSOR) 50 MG tablet, Take 1 tablet (50 mg total) by mouth 2 (two) times daily., Disp: 180 tablet, Rfl: 3 .  Multiple Vitamin (MULTIVITAMIN) capsule, Take 1 capsule by mouth daily., Disp: , Rfl:  .  Omega-3 Fatty Acids (FISH OIL PO), Take 1 capsule by mouth. EVERY OTHER DAY, Disp: , Rfl:  .  pantoprazole (PROTONIX) 40 MG tablet, Take 1 tablet (40 mg total) by mouth 2 (two) times daily., Disp: 180 tablet, Rfl: 3 .  sildenafil (REVATIO) 20 MG tablet, One tab po qd prn, Disp: 20 tablet, Rfl: 0 .  zolpidem (AMBIEN) 10 MG tablet, TAKE 1 TABLET BY MOUTH AT BEDTIME AS NEEDED, Disp: 30 tablet, Rfl: 2 .  levocetirizine (XYZAL) 5 MG tablet, Take 1 tablet (5 mg total) by mouth every evening. (Patient not taking: Reported on 06/23/2019), Disp: 90 tablet, Rfl: 1 .  rosuvastatin (CRESTOR) 10 MG tablet, Take 1 tablet by mouth once daily (Patient not taking: Reported on 06/23/2019), Disp: 90 tablet, Rfl: 0   No Known Allergies   Review of Systems  Constitutional: Negative.   Respiratory: Positive for cough.        He adds that he has a cough after eating second meal of the day. Does not occur  after breakfast or dinner. He does not feel that something is stuck in his throat.   Cardiovascular: Negative.   Gastrointestinal: Positive for abdominal pain. Negative for nausea and vomiting.  Genitourinary: Positive for frequency. Negative for dysuria.  Musculoskeletal: Negative for myalgias.  Neurological: Negative.   Psychiatric/Behavioral: Negative.      Today's Vitals   06/23/19 0915  BP: 124/78  Pulse: 84  Temp: (!) 97.5 F (36.4 C)  TempSrc: Oral  Weight: 166 lb (75.3 kg)  Height: 5\' 10"  (1.778 m)  PainSc: 0-No pain   Body mass index is 23.82 kg/m.   Objective:  Physical Exam Vitals and nursing note reviewed.  Constitutional:      Appearance: Normal appearance.  Cardiovascular:     Rate and Rhythm: Normal rate and regular rhythm.     Heart sounds: Normal heart sounds.  Pulmonary:     Effort: Pulmonary effort is normal.     Breath sounds: Normal breath sounds.  Abdominal:     General: Abdomen is flat. Bowel sounds are normal.     Palpations: Abdomen is soft.     Tenderness: There is no abdominal tenderness.  Skin:    General: Skin is warm.  Neurological:     General: No focal deficit present.     Mental Status: He is alert.  Psychiatric:        Mood and Affect: Mood normal.         Assessment And Plan:     1. Pelvic pain  Abdominal exam is benign. I will check urinalysis.   2. Cough  Likely related to reflux. He is encouraged to keep a food diary to identify what could be triggering his sx. He was also given a list of foods to avoid. Also reminded him to stop eating 3 hours prior to going to bed.   3. Gastroesophageal reflux disease without esophagitis  Chronic. He will continue with pantoprazole 40mg  twice daily. He is encouraged to keep a food diary to help determine if there is a pattern to his symptoms.   4. Urinary frequency  I will check urinalysis. I will treat as indicated.   - POCT Urinalysis Dipstick (81002)   Maximino Greenland, MD     THE PATIENT IS ENCOURAGED TO PRACTICE SOCIAL DISTANCING DUE TO THE COVID-19 PANDEMIC.

## 2019-06-23 NOTE — Patient Instructions (Signed)

## 2019-06-25 ENCOUNTER — Other Ambulatory Visit: Payer: Self-pay | Admitting: Internal Medicine

## 2019-06-25 NOTE — Telephone Encounter (Signed)
Ambien refill 

## 2019-07-14 ENCOUNTER — Encounter (HOSPITAL_COMMUNITY): Payer: Self-pay | Admitting: Emergency Medicine

## 2019-07-14 ENCOUNTER — Emergency Department (HOSPITAL_COMMUNITY)
Admission: EM | Admit: 2019-07-14 | Discharge: 2019-07-14 | Disposition: A | Discharge status: Home / Self Care | Payer: Medicare PPO | Attending: Emergency Medicine | Admitting: Emergency Medicine

## 2019-07-14 ENCOUNTER — Emergency Department (HOSPITAL_COMMUNITY): Payer: Medicare PPO

## 2019-07-14 DIAGNOSIS — Z5321 Procedure and treatment not carried out due to patient leaving prior to being seen by health care provider: Secondary | ICD-10-CM | POA: Insufficient documentation

## 2019-07-14 DIAGNOSIS — R079 Chest pain, unspecified: Secondary | ICD-10-CM | POA: Diagnosis not present

## 2019-07-14 DIAGNOSIS — R0789 Other chest pain: Secondary | ICD-10-CM | POA: Insufficient documentation

## 2019-07-14 LAB — CBC
HCT: 47.7 % (ref 39.0–52.0)
Hemoglobin: 15.7 g/dL (ref 13.0–17.0)
MCH: 29.7 pg (ref 26.0–34.0)
MCHC: 32.9 g/dL (ref 30.0–36.0)
MCV: 90.2 fL (ref 80.0–100.0)
Platelets: 260 10*3/uL (ref 150–400)
RBC: 5.29 MIL/uL (ref 4.22–5.81)
RDW: 12.7 % (ref 11.5–15.5)
WBC: 7.1 10*3/uL (ref 4.0–10.5)
nRBC: 0 % (ref 0.0–0.2)

## 2019-07-14 LAB — BASIC METABOLIC PANEL
Anion gap: 9 (ref 5–15)
BUN: 18 mg/dL (ref 8–23)
CO2: 26 mmol/L (ref 22–32)
Calcium: 9.3 mg/dL (ref 8.9–10.3)
Chloride: 102 mmol/L (ref 98–111)
Creatinine, Ser: 1.08 mg/dL (ref 0.61–1.24)
GFR calc Af Amer: >60 mL/min (ref >60)
GFR calc non Af Amer: >60 mL/min (ref >60)
Glucose, Bld: 113 mg/dL — ABNORMAL HIGH (ref 70–99)
Potassium: 4.5 mmol/L (ref 3.5–5.1)
Sodium: 137 mmol/L (ref 135–145)

## 2019-07-14 LAB — TROPONIN I (HIGH SENSITIVITY): Troponin I (High Sensitivity): 5 ng/L (ref <18)

## 2019-07-14 MED ORDER — SODIUM CHLORIDE 0.9% FLUSH: 3.0000 mL | Freq: Once | INTRAVENOUS | Status: DC

## 2019-07-14 NOTE — ED Triage Notes (Signed)
Patient here from home reporting right sided chest pain that started at 1 pm. Increased with movement. Denis n/v.

## 2019-07-17 ENCOUNTER — Encounter: Payer: Self-pay | Admitting: Internal Medicine

## 2019-07-28 ENCOUNTER — Encounter: Payer: Self-pay | Admitting: Internal Medicine

## 2019-07-30 ENCOUNTER — Ambulatory Visit: Payer: Medicare Other

## 2019-07-30 ENCOUNTER — Encounter: Payer: Medicare Other | Admitting: Internal Medicine

## 2019-07-30 ENCOUNTER — Encounter: Payer: Self-pay | Admitting: Internal Medicine

## 2019-07-30 ENCOUNTER — Ambulatory Visit (INDEPENDENT_AMBULATORY_CARE_PROVIDER_SITE_OTHER): Payer: Medicare PPO | Admitting: Internal Medicine

## 2019-07-30 ENCOUNTER — Other Ambulatory Visit: Payer: Self-pay

## 2019-07-30 VITALS — BP 126/64 | HR 85 | Temp 97.4°F | Ht 69.0 in | Wt 165.2 lb

## 2019-07-30 DIAGNOSIS — Z0000 Encounter for general adult medical examination without abnormal findings: Secondary | ICD-10-CM

## 2019-07-30 DIAGNOSIS — R0789 Other chest pain: Secondary | ICD-10-CM

## 2019-07-30 DIAGNOSIS — R351 Nocturia: Secondary | ICD-10-CM

## 2019-07-30 DIAGNOSIS — F419 Anxiety disorder, unspecified: Secondary | ICD-10-CM | POA: Diagnosis not present

## 2019-07-30 LAB — LIPID PANEL
Chol/HDL Ratio: 5.6 ratio — ABNORMAL HIGH (ref 0.0–5.0)
Cholesterol, Total: 214 mg/dL — ABNORMAL HIGH (ref 100–199)
HDL: 38 mg/dL — ABNORMAL LOW (ref >39)
LDL Chol Calc (NIH): 138 mg/dL — ABNORMAL HIGH (ref 0–99)
Triglycerides: 208 mg/dL — ABNORMAL HIGH (ref 0–149)
VLDL Cholesterol Cal: 38 mg/dL (ref 5–40)

## 2019-07-30 MED ORDER — ESCITALOPRAM OXALATE 10 MG PO TABS
10.0000 mg | ORAL_TABLET | Freq: Every day | ORAL | 0 refills | Status: DC
Start: 2019-07-30 — End: 2020-01-28

## 2019-07-30 NOTE — Patient Instructions (Signed)

## 2019-07-31 LAB — CMP14+EGFR
ALT: 21 IU/L (ref 0–44)
AST: 19 IU/L (ref 0–40)
Albumin/Globulin Ratio: 1.5 (ref 1.2–2.2)
Albumin: 4.5 g/dL (ref 3.7–4.7)
Alkaline Phosphatase: 94 IU/L (ref 48–121)
BUN/Creatinine Ratio: 18 (ref 10–24)
BUN: 17 mg/dL (ref 8–27)
Bilirubin Total: 0.5 mg/dL (ref 0.0–1.2)
CO2: 26 mmol/L (ref 20–29)
Calcium: 9.8 mg/dL (ref 8.6–10.2)
Chloride: 100 mmol/L (ref 96–106)
Creatinine, Ser: 0.96 mg/dL (ref 0.76–1.27)
GFR calc Af Amer: 90 mL/min/{1.73_m2} (ref >59)
GFR calc non Af Amer: 78 mL/min/{1.73_m2} (ref >59)
Globulin, Total: 3 g/dL (ref 1.5–4.5)
Glucose: 120 mg/dL — ABNORMAL HIGH (ref 65–99)
Potassium: 4.6 mmol/L (ref 3.5–5.2)
Sodium: 139 mmol/L (ref 134–144)
Total Protein: 7.5 g/dL (ref 6.0–8.5)

## 2019-07-31 LAB — POC HEMOCCULT BLD/STL (OFFICE/1-CARD/DIAGNOSTIC)
Card #1 Date: 7082021
Fecal Occult Blood, POC: NEGATIVE

## 2019-07-31 LAB — PSA: Prostate Specific Ag, Serum: 0.5 ng/mL (ref 0.0–4.0)

## 2019-08-01 ENCOUNTER — Encounter (INDEPENDENT_AMBULATORY_CARE_PROVIDER_SITE_OTHER): Payer: Self-pay

## 2019-08-02 DIAGNOSIS — F419 Anxiety disorder, unspecified: Secondary | ICD-10-CM | POA: Insufficient documentation

## 2019-08-02 DIAGNOSIS — R351 Nocturia: Secondary | ICD-10-CM | POA: Insufficient documentation

## 2019-08-03 ENCOUNTER — Other Ambulatory Visit: Payer: Self-pay | Admitting: Internal Medicine

## 2019-08-10 NOTE — Progress Notes (Signed)
This visit occurred during the SARS-CoV-2 public health emergency.  Safety protocols were in place, including screening questions prior to the visit, additional usage of staff PPE, and extensive cleaning of exam room while observing appropriate contact time as indicated for disinfecting solutions.  Subjective:     Patient ID: Carlos [name] , male    DOB: [birth year] , 74 y.o.   MRN: [id number]   Chief Complaint  Patient presents with  . Annual Exam    HPI  He is here today for a full physical examination. He has no specific concerns or complaints at this time. However, w/ chart review I see he has had an ER trip for evaluation of cp on 07/14/19. He had negative workup for acute MI, so he was discharged in stable condition. He has not had any recurrence of his sx. However, he does admit to having reflux issues. Upon further questioning, he admits that he is anxious and he admits this could be contributing to his sx.     Past Medical History:  Diagnosis Date  . Allergic rhinitis   . Atypical chest pain 09/16/2015  . Dysrhythmia    a-fib/flutter  . Elevated blood pressure 10/19/2015  . GERD (gastroesophageal reflux disease)   . Hypertension   . Insomnia   . Left inguinal hernia   . Mixed hyperlipidemia   . Pure hypercholesterolemia 10/02/2018  . PVC (premature ventricular contraction) 10/19/2015  . Typical atrial flutter ([place]) 10/02/2018     Family History  Problem Relation Age of Onset  . Healthy Mother   . Heart attack Father   . Pulmonary embolism Brother      Current Outpatient Medications:  .  apixaban (ELIQUIS) 5 MG TABS tablet, Take 1 tablet (5 mg total) by mouth 2 (two) times daily., Disp: 180 tablet, Rfl: 1 .  CALCIUM PO, Take 1 tablet by mouth daily. 600 mg daily, Disp: , Rfl:  .  Cholecalciferol (VITAMIN D PO), Take 1 capsule by mouth daily. , Disp: , Rfl:  .  MAGNESIUM PO, Take by mouth., Disp: , Rfl:  .  metoprolol tartrate (LOPRESSOR) 50 MG tablet, Take 1 tablet  (50 mg total) by mouth 2 (two) times daily., Disp: 180 tablet, Rfl: 3 .  Multiple Vitamin (MULTIVITAMIN) capsule, Take 1 capsule by mouth daily., Disp: , Rfl:  .  Omega-3 Fatty Acids (FISH OIL PO), Take 1 capsule by mouth. EVERY OTHER DAY, Disp: , Rfl:  .  pantoprazole (PROTONIX) 40 MG tablet, Take 1 tablet (40 mg total) by mouth 2 (two) times daily., Disp: 180 tablet, Rfl: 3 .  zolpidem (AMBIEN) 10 MG tablet, TAKE 1 TABLET BY MOUTH AT BEDTIME AS NEEDED, Disp: 30 tablet, Rfl: 1 .  escitalopram (LEXAPRO) 10 MG tablet, Take 1 tablet (10 mg total) by mouth daily., Disp: 90 tablet, Rfl: 0 .  levocetirizine (XYZAL) 5 MG tablet, Take 1 tablet (5 mg total) by mouth every evening. (Patient not taking: Reported on 06/23/2019), Disp: 90 tablet, Rfl: 1 .  rosuvastatin (CRESTOR) 10 MG tablet, Take 1 tablet by mouth once daily, Disp: 90 tablet, Rfl: 0 .  sildenafil (REVATIO) 20 MG tablet, One tab po qd prn, Disp: 20 tablet, Rfl: 0   No Known Allergies   Men's preventive visit. Patient Health Questionnaire (PHQ-2) is    Clinical Support from 02/19/2019 in [facility]  PHQ-2 Total Score 0    . Patient is on a healthy diet. Marital status: Divorced. Relevant history for alcohol use is:  Social History   Substance and Sexual Activity  Alcohol Use Yes   Comment: 10-12 drinks per week  . Relevant history for tobacco use is:  Social History   Tobacco Use  Smoking Status Former Smoker  . Packs/day: 0.50  . Years: 9.00  . Pack years: 4.50  . Types: Cigarettes  . Quit date: 01/22/2006  . Years since quitting: 13.5  Smokeless Tobacco Never Used  .  Review of Systems  Constitutional: Negative.   HENT: Negative.   Eyes: Negative.   Respiratory: Negative.   Cardiovascular: Negative.   Endocrine: Negative.   Genitourinary: Positive for frequency.  Musculoskeletal: Negative.   Skin: Negative.   Allergic/Immunologic: Negative.   Neurological: Negative.   Hematological: Negative.    Psychiatric/Behavioral: The patient is nervous/anxious.      Today's Vitals   07/30/19 0842  BP: 126/64  Pulse: 85  Temp: (!) 97.4 F (36.3 C)  TempSrc: Oral  Weight: 165 lb 3.2 oz (74.9 kg)  Height: 5' 9" (1.753 m)   Body mass index is 24.4 kg/m.   Objective:  Physical Exam Vitals and nursing note reviewed.  Constitutional:      Appearance: Normal appearance.  HENT:     Head: Normocephalic and atraumatic.     Right Ear: Tympanic membrane, ear canal and external ear normal.     Left Ear: Tympanic membrane, ear canal and external ear normal.     Nose: Nose normal.     Mouth/Throat:     Mouth: Mucous membranes are moist.     Pharynx: Oropharynx is clear.  Eyes:     Extraocular Movements: Extraocular movements intact.     Conjunctiva/sclera: Conjunctivae normal.     Pupils: Pupils are equal, round, and reactive to light.  Cardiovascular:     Rate and Rhythm: Normal rate and regular rhythm.     Pulses: Normal pulses.     Heart sounds: Normal heart sounds.  Pulmonary:     Effort: Pulmonary effort is normal.     Breath sounds: Normal breath sounds.  Chest:     Breasts:        Right: Normal. No swelling, bleeding, inverted nipple, mass or nipple discharge.        Left: Normal. No swelling, bleeding, inverted nipple, mass or nipple discharge.     Comments: Healed surgical scar Abdominal:     General: Abdomen is flat. Bowel sounds are normal.     Palpations: Abdomen is soft.  Genitourinary:    Prostate: Normal.     Rectum: Normal. Guaiac result negative.  Musculoskeletal:        General: Normal range of motion.     Cervical back: Normal range of motion and neck supple.  Skin:    General: Skin is warm.  Neurological:     General: No focal deficit present.     Mental Status: He is alert.  Psychiatric:        Mood and Affect: Mood normal.        Behavior: Behavior normal.         Assessment And Plan:     1. Routine general medical examination at health care  facility  Comments: A full exam was performed.  DRE performed, stool is heme negative. PATIENT IS ADVISED TO GET 30-45 MINUTES REGULAR EXERCISE NO LESS THAN FOUR TO FIVE DAYS PER WEEK - BOTH WEIGHTBEARING EXERCISES AND AEROBIC ARE RECOMMENDED.  PATIENT IS ADVISED TO FOLLOW A HEALTHY DIET WITH AT LEAST SIX FRUITS/VEGGIES PER  DAY, DECREASE INTAKE OF RED MEAT, AND TO INCREASE FISH INTAKE TO TWO DAYS PER WEEK.  MEATS/FISH SHOULD NOT BE FRIED, BAKED OR BROILED IS PREFERABLE.  I SUGGEST WEARING SPF 50 SUNSCREEN ON EXPOSED PARTS AND ESPECIALLY WHEN IN THE DIRECT SUNLIGHT FOR AN EXTENDED PERIOD OF TIME.  PLEASE AVOID FAST FOOD RESTAURANTS AND INCREASE YOUR WATER INTAKE.  - POC Hemoccult Bld/Stl (1-Cd Office Dx)  2. Anxiety  Comments: HAM questionnaire completed. He agrees to start meds. I will start escitalopram 70m daily. Advised to take at bedtime. Possible side effects d/w pt. He agrees to rto in four to six weeks for re-evaluation.   3. Nocturia  - PSA  4. Atypical chest pain  Comments: Recurrent. He has had several ER visits, I think his sx are GI related. He has completed full GI workup.   - Lipid panel - CMP14+EGFR     Patient was given opportunity to ask questions. Patient verbalized understanding of the plan and was able to repeat key elements of the plan. All questions were answered to their satisfaction.  RMaximino Greenland MD   I, RMaximino Greenland MD, have reviewed all documentation for this visit. The documentation on 08/10/19 for the exam, diagnosis, procedures, and orders are all accurate and complete.  THE PATIENT IS ENCOURAGED TO PRACTICE SOCIAL DISTANCING DUE TO THE COVID-19 PANDEMIC.

## 2019-08-25 ENCOUNTER — Other Ambulatory Visit: Payer: Self-pay | Admitting: Internal Medicine

## 2019-08-25 NOTE — Telephone Encounter (Signed)
Ambien refill 

## 2019-09-07 ENCOUNTER — Encounter: Payer: Self-pay | Admitting: Internal Medicine

## 2019-09-14 ENCOUNTER — Other Ambulatory Visit: Payer: Self-pay | Admitting: Cardiovascular Disease

## 2019-09-15 ENCOUNTER — Other Ambulatory Visit: Payer: Self-pay

## 2019-09-15 ENCOUNTER — Ambulatory Visit (INDEPENDENT_AMBULATORY_CARE_PROVIDER_SITE_OTHER): Payer: Medicare PPO | Admitting: Internal Medicine

## 2019-09-15 ENCOUNTER — Encounter: Payer: Self-pay | Admitting: Internal Medicine

## 2019-09-15 VITALS — BP 112/64 | HR 74 | Temp 97.5°F | Ht 69.2 in | Wt 167.0 lb

## 2019-09-15 DIAGNOSIS — F419 Anxiety disorder, unspecified: Secondary | ICD-10-CM

## 2019-09-15 DIAGNOSIS — F5104 Psychophysiologic insomnia: Secondary | ICD-10-CM

## 2019-09-15 DIAGNOSIS — R519 Headache, unspecified: Secondary | ICD-10-CM | POA: Diagnosis not present

## 2019-09-15 DIAGNOSIS — R208 Other disturbances of skin sensation: Secondary | ICD-10-CM

## 2019-09-15 NOTE — Patient Instructions (Addendum)
Managing Anxiety, Adult After being diagnosed with an anxiety disorder, you may be relieved to know why you have felt or behaved a certain way. You may also feel overwhelmed about the treatment ahead and what it will mean for your life. With care and support, you can manage this condition and recover from it. How to manage lifestyle changes Managing stress and anxiety  Stress is your body's reaction to life changes and events, both good and bad. Most stress will last just a few hours, but stress can be ongoing and can lead to more than just stress. Although stress can play a major role in anxiety, it is not the same as anxiety. Stress is usually caused by something external, such as a deadline, test, or competition. Stress normally passes after the triggering event has ended.  Anxiety is caused by something internal, such as imagining a terrible outcome or worrying that something will go wrong that will devastate you. Anxiety often does not go away even after the triggering event is over, and it can become long-term (chronic) worry. It is important to understand the differences between stress and anxiety and to manage your stress effectively so that it does not lead to an anxious response. Talk with your health care provider or a counselor to learn more about reducing anxiety and stress. He or she may suggest tension reduction techniques, such as:  Music therapy. This can include creating or listening to music that you enjoy and that inspires you.  Mindfulness-based meditation. This involves being aware of your normal breaths while not trying to control your breathing. It can be done while sitting or walking.  Centering prayer. This involves focusing on a word, phrase, or sacred image that means something to you and brings you peace.  Deep breathing. To do this, expand your stomach and inhale slowly through your nose. Hold your breath for 3-5 seconds. Then exhale slowly, letting your stomach muscles  relax.  Self-talk. This involves identifying thought patterns that lead to anxiety reactions and changing those patterns.  Muscle relaxation. This involves tensing muscles and then relaxing them. Choose a tension reduction technique that suits your lifestyle and personality. These techniques take time and practice. Set aside 5-15 minutes a day to do them. Therapists can offer counseling and training in these techniques. The training to help with anxiety may be covered by some insurance plans. Other things you can do to manage stress and anxiety include:  Keeping a stress/anxiety diary. This can help you learn what triggers your reaction and then learn ways to manage your response.  Thinking about how you react to certain situations. You may not be able to control everything, but you can control your response.  Making time for activities that help you relax and not feeling guilty about spending your time in this way.  Visual imagery and yoga can help you stay calm and relax.  Medicines Medicines can help ease symptoms. Medicines for anxiety include:  Anti-anxiety drugs.  Antidepressants. Medicines are often used as a primary treatment for anxiety disorder. Medicines will be prescribed by a health care provider. When used together, medicines, psychotherapy, and tension reduction techniques may be the most effective treatment. Relationships Relationships can play a big part in helping you recover. Try to spend more time connecting with trusted friends and family members. Consider going to couples counseling, taking family education classes, or going to family therapy. Therapy can help you and others better understand your condition. How to recognize changes in your   anxiety Everyone responds differently to treatment for anxiety. Recovery from anxiety happens when symptoms decrease and stop interfering with your daily activities at home or work. This may mean that you will start to:  Have  better concentration and focus. Worry will interfere less in your daily thinking.  Sleep better.  Be less irritable.  Have more energy.  Have improved memory. It is important to recognize when your condition is getting worse. Contact your health care provider if your symptoms interfere with home or work and you feel like your condition is not improving. Follow these instructions at home: Activity  Exercise. Most adults should do the following: ? Exercise for at least 150 minutes each week. The exercise should increase your heart rate and make you sweat (moderate-intensity exercise). ? Strengthening exercises at least twice a week.  Get the right amount and quality of sleep. Most adults need 7-9 hours of sleep each night. Lifestyle   Eat a healthy diet that includes plenty of vegetables, fruits, whole grains, low-fat dairy products, and lean protein. Do not eat a lot of foods that are high in solid fats, added sugars, or salt.  Make choices that simplify your life.  Do not use any products that contain nicotine or tobacco, such as cigarettes, e-cigarettes, and chewing tobacco. If you need help quitting, ask your health care provider.  Avoid caffeine, alcohol, and certain over-the-counter cold medicines. These may make you feel worse. Ask your pharmacist which medicines to avoid. General instructions  Take over-the-counter and prescription medicines only as told by your health care provider.  Keep all follow-up visits as told by your health care provider. This is important. Where to find support You can get help and support from these sources:  Self-help groups.  Online and community organizations.  A trusted spiritual leader.  Couples counseling.  Family education classes.  Family therapy. Where to find more information You may find that joining a support group helps you deal with your anxiety. The following sources can help you locate counselors or support groups near  you:  Mental Health America: www.mentalhealthamerica.net  Anxiety and Depression Association of America (ADAA): www.adaa.org  National Alliance on Mental Illness (NAMI): www.nami.org Contact a health care provider if you:  Have a hard time staying focused or finishing daily tasks.  Spend many hours a day feeling worried about everyday life.  Become exhausted by worry.  Start to have headaches, feel tense, or have nausea.  Urinate more than normal.  Have diarrhea. Get help right away if you have:  A racing heart and shortness of breath.  Thoughts of hurting yourself or others. If you ever feel like you may hurt yourself or others, or have thoughts about taking your own life, get help right away. You can go to your nearest emergency department or call:  Your local emergency services (911 in the [place]  A suicide crisis helpline, such as the National Suicide Prevention Lifeline at 1-800-273-8255. This is open 24 hours a day. Summary  Taking steps to learn and use tension reduction techniques can help calm you and help prevent triggering an anxiety reaction.  When used together, medicines, psychotherapy, and tension reduction techniques may be the most effective treatment.  Family, friends, and partners can play a big part in helping you recover from an anxiety disorder. This information is not intended to replace advice given to you by your health care provider. Make sure you discuss any questions you have with your health care provider. Document Revised:   06/10/2018 Document Reviewed: 06/10/2018 Elsevier Patient Education  2020 [employer].    Cardiac Ablation  Cardiac ablation is a procedure to stop some heart tissue from causing problems. The heart has many electrical connections. Sometimes these connections make the heart beat very fast or irregularly. Removing some problem areas can improve the heart rhythm or make it normal. What happens before the  procedure?  Follow instructions from your doctor about what you cannot eat or drink.  Ask your doctor about: ? Changing or stopping your normal medicines. This is important if you take diabetes medicines or blood thinners. ? Taking medicines such as aspirin and ibuprofen. These medicines can thin your blood. Do not take these medicines before your procedure if your doctor tells you not to.  Plan to have someone take you home.  If you will be going home right after the procedure, plan to have someone with you for 24 hours. What happens during the procedure?  To lower your risk of infection: ? Your health care team will wash or sanitize their hands. ? Your skin will be washed with soap. ? Hair may be removed from your neck or groin.  An IV tube will be put into one of your veins.  You will be given a medicine to help you relax (sedative).  Skin on your neck or groin will be numbed.  A cut (incision) will be made in your neck or groin.  A needle will be put through your cut and into a vein in your neck or groin.  A tube (catheter) will be put into the needle. The tube will be moved to your heart. X-rays (fluoroscopy) will be used to help guide the tube.  Small devices (electrodes) on the tip of the tube will send out electrical currents.  Dye may be put through the tube. This helps your surgeon see your heart.  Electrical energy will be used to scar (ablate) some heart tissue. Your surgeon may use: ? Heat (radiofrequency energy). ? Laser energy. ? Extreme cold (cryoablation).  The tube will be taken out.  Pressure will be held on your cut. This helps stop bleeding.  A bandage (dressing) will be put on your cut. The procedure may vary. What happens after the procedure?  You will be monitored until your medicines have worn off.  Your cut will be watched for bleeding. You will need to lie still for a few hours.  Do not drive for 24 hours or as long as your doctor tells  you. Summary  Cardiac ablation is a procedure to stop some heart tissue from causing problems.  Electrical energy will be used to scar (ablate) some heart tissue. This information is not intended to replace advice given to you by your health care provider. Make sure you discuss any questions you have with your health care provider. Document Revised: 12/21/2016 Document Reviewed: 11/28/2015 Elsevier Patient Education  2020 [employer].

## 2019-09-15 NOTE — Progress Notes (Signed)
I,Katawbba Wiggins,acting as a Neurosurgeon for [name], MD.,have documented all relevant documentation on the behalf of [name], MD,as directed by  [name], MD while in the presence of [name], MD.  This visit occurred during the SARS-CoV-2 public health emergency.  Safety protocols were in place, including screening questions prior to the visit, additional usage of staff PPE, and extensive cleaning of exam room while observing appropriate contact time as indicated for disinfecting solutions.  Subjective:     Patient ID: Carlos [name] , male    DOB: 10-18-1945 , 74 y.o.   MRN: [id number]   Chief Complaint  Patient presents with  . Anxiety    HPI  The patient is here today for an anxiety follow-up.  The patient reports no issues with current medication.    Anxiety Presents for follow-up visit. Symptoms include nervous/anxious behavior. Patient reports no compulsions, decreased concentration, feeling of choking or palpitations.       Past Medical History:  Diagnosis Date  . Allergic rhinitis   . Atypical chest pain 09/16/2015  . Dysrhythmia    a-fib/flutter  . Elevated blood pressure 10/19/2015  . GERD (gastroesophageal reflux disease)   . Hypertension   . Insomnia   . Left inguinal hernia   . Mixed hyperlipidemia   . Pure hypercholesterolemia 10/02/2018  . PVC (premature ventricular contraction) 10/19/2015  . Typical atrial flutter (HCC) 10/02/2018     Family History  Problem Relation Age of Onset  . Healthy Mother   . Heart attack Father   . Pulmonary embolism Brother      Current Outpatient Medications:  .  CALCIUM PO, Take 1 tablet by mouth daily. 600 mg daily, Disp: , Rfl:  .  Cholecalciferol (VITAMIN D PO), Take 1 capsule by mouth daily. , Disp: , Rfl:  .  ELIQUIS 5 MG TABS tablet, Take 1 tablet by mouth twice daily, Disp: 180 tablet, Rfl: 0 .  escitalopram (LEXAPRO) 10 MG tablet, Take 1 tablet (10 mg total) by mouth daily., Disp: 90  tablet, Rfl: 0 .  linaclotide (LINZESS) 72 MCG capsule, Take 72 mcg by mouth daily before breakfast., Disp: , Rfl:  .  MAGNESIUM PO, Take by mouth., Disp: , Rfl:  .  metoprolol tartrate (LOPRESSOR) 50 MG tablet, Take 1 tablet (50 mg total) by mouth 2 (two) times daily., Disp: 180 tablet, Rfl: 3 .  Multiple Vitamin (MULTIVITAMIN) capsule, Take 1 capsule by mouth daily., Disp: , Rfl:  .  Omega-3 Fatty Acids (FISH OIL PO), Take 1 capsule by mouth. EVERY OTHER DAY, Disp: , Rfl:  .  rosuvastatin (CRESTOR) 10 MG tablet, Take 1 tablet by mouth once daily, Disp: 90 tablet, Rfl: 0 .  zolpidem (AMBIEN) 10 MG tablet, TAKE 1 TABLET BY MOUTH AT BEDTIME AS NEEDED, Disp: 30 tablet, Rfl: 1   No Known Allergies   Review of Systems  Constitutional: Negative.   Respiratory: Negative.   Cardiovascular: Negative.  Negative for palpitations.  Gastrointestinal: Negative.   Neurological: Positive for headaches.       Several nights ago, he had a quick pressure in his head. He is not sure what may have triggered his sx. He denies visual disturbances. He denies UE/LE weakness. He has not had any recurrent sx.   Additionally, he had an "electric shock" go through his right foot radiating to his leg one night while in bed. He is not sure what triggered his sx. Again, denies LE weakness. Denies presence of  rashes, fasciculations.   Psychiatric/Behavioral: Negative for decreased concentration. The patient is nervous/anxious.   All other systems reviewed and are negative.    Today's Vitals   09/15/19 1601  BP: 112/64  Pulse: 74  Temp: (!) 97.5 F (36.4 C)  TempSrc: Oral  Weight: 167 lb (75.8 kg)  Height: 5' 9.2" (1.758 m)  PainSc: 0-No pain   Body mass index is 24.52 kg/m.   Objective:  Physical Exam Vitals and nursing note reviewed.  Constitutional:      Appearance: Normal appearance. He is obese.  HENT:     Head: Normocephalic and atraumatic.  Cardiovascular:     Rate and Rhythm: Normal rate and  regular rhythm.     Heart sounds: Normal heart sounds.  Pulmonary:     Breath sounds: Normal breath sounds.  Skin:    General: Skin is warm.  Neurological:     General: No focal deficit present.     Mental Status: He is alert and oriented to person, place, and time.         Assessment And Plan:     1. Anxiety Comments: Symptoms have improved. He will continue with escitalopram. I will refer him to therapy as well. HE is in agreement with his treatment plan. He will RTO in 3 months for re-evaluation.  - Ambulatory referral to Psychology  2. Chronic insomnia Comments: Chronic, he was given refill of zolpidem. PMDP reviewed. He is encouraged to consider trying 1/2 pill nightly.   3. Nonintractable headache, unspecified chronicity pattern, unspecified headache type Comments: He has had one occurrence. He is encouraged to stay well hydrated and he will let me know if his sx recur.  4. Electrical shock sensation Comments: This occurred in right lower extremity. He will keep watch and let me know if this occurs again.      Patient was given opportunity to ask questions. Patient verbalized understanding of the plan and was able to repeat key elements of the plan. All questions were answered to their satisfaction.  [name], MD   I, [name], MD, have reviewed all documentation for this visit. The documentation on 09/15/19 for the exam, diagnosis, procedures, and orders are all accurate and complete.  THE PATIENT IS ENCOURAGED TO PRACTICE SOCIAL DISTANCING DUE TO THE COVID-19 PANDEMIC.

## 2019-09-27 ENCOUNTER — Encounter: Payer: Self-pay | Admitting: Internal Medicine

## 2019-10-08 ENCOUNTER — Other Ambulatory Visit: Payer: Self-pay | Admitting: Internal Medicine

## 2019-10-08 NOTE — Telephone Encounter (Signed)
Refills

## 2019-10-14 ENCOUNTER — Ambulatory Visit: Payer: Medicare PPO | Admitting: Cardiovascular Disease

## 2019-10-14 ENCOUNTER — Encounter: Payer: Self-pay | Admitting: Cardiovascular Disease

## 2019-10-14 ENCOUNTER — Other Ambulatory Visit: Payer: Self-pay

## 2019-10-14 VITALS — BP 118/82 | HR 87 | Ht 69.0 in | Wt 171.2 lb

## 2019-10-14 DIAGNOSIS — E7800 Pure hypercholesterolemia, unspecified: Secondary | ICD-10-CM

## 2019-10-14 DIAGNOSIS — I480 Paroxysmal atrial fibrillation: Secondary | ICD-10-CM | POA: Diagnosis not present

## 2019-10-14 NOTE — Patient Instructions (Signed)
Medication Instructions:  Your physician recommends that you continue on your current medications as directed. Please refer to the Current Medication list given to you today.  *If you need a refill on your cardiac medications before your next appointment, please call your pharmacy*  Lab Work: NONE  Testing/Procedures: NONE  Follow-Up: At [facility], you and your health needs are our priority.  As part of our continuing mission to provide you with exceptional heart care, we have created designated Provider Care Teams.  These Care Teams include your primary Cardiologist (physician) and Advanced Practice Providers (APPs -  Physician Assistants and Nurse Practitioners) who all work together to provide you with the care you need, when you need it.  We recommend signing up for the patient portal called "MyChart".  Sign up information is provided on this After Visit Summary.  MyChart is used to connect with patients for Virtual Visits (Telemedicine).  Patients are able to view lab/test results, encounter notes, upcoming appointments, etc.  Non-urgent messages can be sent to your provider as well.   To learn more about what you can do with MyChart, go to [email or web address].    Your next appointment:   12 month(s)  The format for your next appointment:   In Person  Provider:   You may see Tiffany New Melle, MD or one of the following Advanced Practice Providers on your designated Care Team:    Luke Kilroy, PA-C  Callie Goodrich, PA-C  Jesse Cleaver, FNP     

## 2019-10-14 NOTE — Progress Notes (Signed)
Cardiology Office Note   Date:  10/14/2019   ID:  Carlos [name], DOB 11-Apr-1945, MRN [id number]  PCP:  Dorothyann Peng, MD  Cardiologist:   Chilton Si, MD   No chief complaint on file.    History of Present Illness: Carlos [name] is a 74 y.o. male with persistentl atrial fibrillation, hyperlipidemia, ASD s/p surgical repair, and mild ascending aorta (3.6 cm) aneurysm who presents for follow up.  He was initially seen 08/2015 due to L sided chest pain.  He was referred for ETT, which was negative for ischemia.  He was noted to have several PVCs but was asymptomatic.  Dr. Clelia Croft followed up with Dr. Allyne Gee on 05/22/17 due to dysphagia.  While there his heart rate was 130 bpm and he was in atrial fibrillation.  He was started on metoprolol and referred to cardiology.  Dr. Sherryll Burger had an echo 05/2017 that revealed LVEF 60-65%.  He was also started on Eliquis.     Dr. Sherryll Burger saw Carlos Reining, DNP, for cardiac clearance.  He had L inguinal hernia repair 07/2018 with Dr. Corliss Skains that went well.  At his last visit he had some atypical chest pain that was thought to be due to GERD and he was started on a PPI.  Symptoms have improved since making this change.  Now he only has chest pain rarely when he lays down.  He continues to walk for exercise.  He walks for 0.75 miles to 1.25 miles daily.  He gets tired but has no chest pain or shortness of breath.  He denies any lower extremity edema, orthopnea, or PND.  He sometimes finds it difficult to fall asleep.  This has been worse for the last 2 to 3 weeks.  He does take Ambien at times which helps.   Past Medical History:  Diagnosis Date  . Allergic rhinitis   . Atypical chest pain 09/16/2015  . Dysrhythmia    a-fib/flutter  . Elevated blood pressure 10/19/2015  . GERD (gastroesophageal reflux disease)   . Hypertension   . Insomnia   . Left inguinal hernia   . Mixed hyperlipidemia   . Pure hypercholesterolemia 10/02/2018  . PVC (premature  ventricular contraction) 10/19/2015  . Typical atrial flutter (HCC) 10/02/2018    Past Surgical History:  Procedure Laterality Date  . ASD REPAIR  1994  . INGUINAL HERNIA REPAIR  1994  . INGUINAL HERNIA REPAIR Left 09/03/2018   Procedure: LEFT INGUINAL HERNIA REPAIR WITH MESH;  Surgeon: Manus Rudd, MD;  Location: [facility];  Service: General;  Laterality: Left;     Current Outpatient Medications  Medication Sig Dispense Refill  . CALCIUM PO Take 1 tablet by mouth daily. 600 mg daily    . Cholecalciferol (VITAMIN D PO) Take 1 capsule by mouth daily.     Marland Kitchen ELIQUIS 5 MG TABS tablet Take 1 tablet by mouth twice daily 180 tablet 0  . escitalopram (LEXAPRO) 10 MG tablet Take 1 tablet (10 mg total) by mouth daily. 90 tablet 0  . MAGNESIUM PO Take by mouth.    . metoprolol tartrate (LOPRESSOR) 50 MG tablet Take 1 tablet (50 mg total) by mouth 2 (two) times daily. 180 tablet 3  . Multiple Vitamin (MULTIVITAMIN) capsule Take 1 capsule by mouth daily.    . Omega-3 Fatty Acids (FISH OIL PO) Take 1 capsule by mouth. EVERY OTHER DAY    . rosuvastatin (CRESTOR) 10 MG tablet Take 1 tablet by mouth once daily 90 tablet  2  . zolpidem (AMBIEN) 10 MG tablet TAKE 1 TABLET BY MOUTH AT BEDTIME AS NEEDED 30 tablet 1   No current facility-administered medications for this visit.    Allergies:   Patient has no known allergies.    Social History:  The patient  reports that he quit smoking about 13 years ago. His smoking use included cigarettes. He has a 4.50 pack-year smoking history. He has never used smokeless tobacco. He reports current alcohol use. He reports that he does not use drugs.   Family History:  The patient's family history includes Healthy in his mother; Heart attack in his father; Pulmonary embolism in his brother.    ROS:  Please see the history of present illness.   Otherwise, review of systems are positive for none.   All other systems are reviewed and negative.     PHYSICAL EXAM: VS:  BP 118/82   Pulse 87   Ht 5\' 9"  (1.753 m)   Wt 171 lb 3.2 oz (77.7 kg)   SpO2 94%   BMI 25.28 kg/m  , BMI Body mass index is 25.28 kg/m. GENERAL:  Well appearing HEENT: Pupils equal round and reactive, fundi not visualized, oral mucosa unremarkable NECK:  No jugular venous distention, waveform within normal limits, carotid upstroke brisk and symmetric, no bruits LUNGS:  Clear to auscultation bilaterally HEART:  Irregularly irregular.  PMI not displaced or sustained,S1 and S2 within normal limits, no S3, no S4, no clicks, no rubs, no murmurs ABD:  Flat, positive bowel sounds normal in frequency in pitch, no bruits, no rebound, no guarding, no midline pulsatile mass, no hepatomegaly, no splenomegaly EXT:  2 plus pulses throughout, no edema, no cyanosis no clubbing SKIN:  No rashes no nodules NEURO:  Cranial nerves II through XII grossly intact, motor grossly intact throughout PSYCH:  Cognitively intact, oriented to person place and time   EKG:  EKG is ordered today. The ekg ordered 09/16/15 demonstrates sinus rhythm rate 66 bpm. 05/22/17: Atrial flutter.  Ventricular rate 130 bpm.   10/02/2018: Atrial flutter.  Rate 86 bpm. 04/16/2019: Atrial flutter.  Rate 87 bpm.  PVC. 10/14/19: Atrial flutter.  Rate 88 bpm.  ETT 10/07/15: Blood pressure demonstrated a normal response to exercise.  Upsloping ST segment depression ST segment depression was noted during stress in the V4, V5, V6, II, III and aVF leads.  No T wave inversion was noted during stress.  Negative, adequate stress test.   Echo 05/2017: Study Conclusions  - Left ventricle: The cavity size was normal. Systolic function was   normal. The estimated ejection fraction was in the range of 60%   to 65%. Wall motion was normal; there were no regional wall   motion abnormalities. - Ventricular septum: Septal motion showed paradox. - Mitral valve: There was mild to moderate regurgitation directed    centrally. - Left atrium: The atrium was mildly dilated. - Right ventricle: The cavity size was moderately dilated. Wall   thickness was normal. - Right atrium: The atrium was mildly dilated. - Atrial septum: No defect or patent foramen ovale was identified. - Pulmonary arteries: PA peak pressure: 32 mm Hg (S).  Impressions:  - Atrial flutter with variable block and mild RVR during the entire   study.  Recent Labs: 04/17/2019: TSH 2.25 07/14/2019: Hemoglobin 15.7; Platelets 260 07/30/2019: ALT 21; BUN 17; Creatinine, Ser 0.96; Potassium 4.6; Sodium 139    Lipid Panel    Component Value Date/Time   CHOL 214 (H) 07/30/2019 09/30/2019  TRIG 208 (H) 07/30/2019 0948   HDL 38 (L) 07/30/2019 0948   CHOLHDL 5.6 (H) 07/30/2019 0948   LDLCALC 138 (H) 07/30/2019 0948      Wt Readings from Last 3 Encounters:  10/14/19 171 lb 3.2 oz (77.7 kg)  09/15/19 167 lb (75.8 kg)  07/30/19 165 lb 3.2 oz (74.9 kg)      ASSESSMENT AND PLAN:  # Persistent atrial flutter: Mr. Leja is remains in atrial flutter.  The rates are well-controlled and he is completely asymptomatic.  Continue metoprolol and Eliquis.  # Atypical chest pain:   Resolved.   ETT negative for ischemia.  # GERD: Chest pain has resolved and symptoms improved on pantoprazole.  # Hyperlipidemia:  Lipids were well controlled on rosuvastatin but were elevated at his last visit.  He reports that he is taking it.  He is going to work on limiting his sweets and repeat his lipids when he sees Dr. Allyne Gee in December.  If his LDL remains over 100, would recommend increasing the rosuvastatin to 20 mg.    Current medicines are reviewed at length with the patient today.  The patient does not have concerns regarding medicines.  The following changes have been made:  no change  Labs/ tests ordered today include:   No orders of the defined types were placed in this encounter.    Disposition:   FU with Huston Stonehocker C. Duke Salvia, MD, [facility] in 1  year.     Signed, Margarete Horace C. Duke Salvia, MD, [facility]  10/14/2019 5:26 PM    [facility]

## 2019-12-16 ENCOUNTER — Other Ambulatory Visit: Payer: Self-pay | Admitting: Cardiovascular Disease

## 2019-12-16 ENCOUNTER — Encounter: Payer: Self-pay | Admitting: Internal Medicine

## 2019-12-16 ENCOUNTER — Other Ambulatory Visit: Payer: Self-pay | Admitting: Internal Medicine

## 2019-12-16 NOTE — Telephone Encounter (Signed)
Zolpidem refill

## 2019-12-21 ENCOUNTER — Other Ambulatory Visit: Payer: Self-pay | Admitting: Internal Medicine

## 2019-12-21 MED ORDER — ZOLPIDEM TARTRATE 10 MG PO TABS: 10.0000 mg | ORAL_TABLET | Freq: Every evening | ORAL | 2 refills | Status: DC | PRN

## 2020-01-07 ENCOUNTER — Ambulatory Visit: Payer: Medicare PPO | Admitting: Internal Medicine

## 2020-01-27 ENCOUNTER — Ambulatory Visit (INDEPENDENT_AMBULATORY_CARE_PROVIDER_SITE_OTHER): Payer: Medicare PPO | Admitting: Internal Medicine

## 2020-01-27 ENCOUNTER — Other Ambulatory Visit: Payer: Self-pay | Admitting: Internal Medicine

## 2020-01-27 ENCOUNTER — Encounter: Payer: Self-pay | Admitting: Internal Medicine

## 2020-01-27 ENCOUNTER — Other Ambulatory Visit: Payer: Self-pay

## 2020-01-27 VITALS — BP 118/64 | HR 68 | Temp 98.0°F | Ht 69.0 in | Wt 165.0 lb

## 2020-01-27 DIAGNOSIS — G4709 Other insomnia: Secondary | ICD-10-CM

## 2020-01-27 DIAGNOSIS — F1010 Alcohol abuse, uncomplicated: Secondary | ICD-10-CM | POA: Diagnosis not present

## 2020-01-27 DIAGNOSIS — F419 Anxiety disorder, unspecified: Secondary | ICD-10-CM

## 2020-01-27 NOTE — Patient Instructions (Addendum)
Alcohol Use Disorder °Alcohol use disorder is when your drinking disrupts your daily life. When you have this condition, you drink too much alcohol and you cannot control your drinking. °Alcohol use disorder can cause serious problems with your physical health. It can affect your brain, heart, liver, pancreas, immune system, stomach, and intestines. Alcohol use disorder can increase your risk for certain cancers and cause problems with your mental health, such as depression, anxiety, psychosis, delirium, and dementia. People with this disorder risk hurting themselves and others. °What are the causes? °This condition is caused by drinking too much alcohol over time. It is not caused by drinking too much alcohol only one or two times. Some people with this condition drink alcohol to cope with or escape from negative life events. Others drink to relieve pain or symptoms of mental illness. °What increases the risk? °You are more likely to develop this condition if: °· You have a family history of alcohol use disorder. °· Your culture encourages drinking to the point of intoxication, or makes alcohol easy to get. °· You had a mood or conduct disorder in childhood. °· You have been a victim of abuse. °· You are an adolescent and: °? You have poor grades or difficulties in school. °? Your caregivers do not talk to you about saying no to alcohol, or supervise your activities. °? You are impulsive or you have trouble with self-control. °What are the signs or symptoms? °Symptoms of this condition include: °· Drinking more than you want to. °· Drinking for longer than you want to. °· Trying several times to drink less or to control your drinking. °· Spending a lot of time getting alcohol, drinking, or recovering from drinking. °· Craving alcohol. °· Having problems at work, at school, or at home due to drinking. °· Having problems in relationships due to drinking. °· Drinking when it is dangerous to drink, such as before  driving a car. °· Continuing to drink even though you know you might have a physical or mental problem related to drinking. °· Needing more and more alcohol to get the same effect you want from the alcohol (building up tolerance). °· Having symptoms of withdrawal when you stop drinking. Symptoms of withdrawal include: °? Fatigue. °? Nightmares. °? Trouble sleeping. °? Depression. °? Anxiety. °? Fever. °? Seizures. °? Severe confusion. °? Feeling or seeing things that are not there (hallucinations). °? Tremors. °? Rapid heart rate. °? Rapid breathing. °? High blood pressure. °· Drinking to avoid symptoms of withdrawal. °How is this diagnosed? °This condition is diagnosed with an assessment. Your health care provider may start the assessment by asking three or four questions about your drinking. °Your health care provider may perform a physical exam or do lab tests to see if you have physical problems resulting from alcohol use. She or he may refer you to a mental health professional for evaluation. °How is this treated? °Some people with alcohol use disorder are able to reduce their alcohol use to low-risk levels. Others need to completely quit drinking alcohol. When necessary, mental health professionals with specialized training in substance use treatment can help. Your health care provider can help you decide how severe your alcohol use disorder is and what type of treatment you need. The following forms of treatment are available: °· Detoxification. Detoxification involves quitting drinking and using prescription medicines within the first week to help lessen withdrawal symptoms. This treatment is important for people who have had withdrawal symptoms before and for heavy drinkers   who are likely to have withdrawal symptoms. Alcohol withdrawal can be dangerous, and in severe cases, it can cause death. Detoxification may be provided in a home, community, or primary care setting, or in a hospital or substance use  treatment facility. °· Counseling. This treatment is also called talk therapy. It is provided by substance use treatment counselors. A counselor can address the reasons you use alcohol and suggest ways to keep you from drinking again or to prevent problem drinking. The goals of talk therapy are to: °? Find healthy activities and ways for you to cope with stress. °? Identify and avoid the things that trigger your alcohol use. °? Help you learn how to handle cravings. °· Medicines. Medicines can help treat alcohol use disorder by: °? Decreasing alcohol cravings. °? Decreasing the positive feeling you have when you drink alcohol. °? Causing an uncomfortable physical reaction when you drink alcohol (aversion therapy). °· Support groups. Support groups are led by people who have quit drinking. They provide emotional support, advice, and guidance. °These forms of treatment are often combined. Some people with this condition benefit from a combination of treatments provided by specialized substance use treatment centers. °Follow these instructions at home: °· Take over-the-counter and prescription medicines only as told by your health care provider. °· Check with your health care provider before starting any new medicines. °· Ask friends and family members not to offer you alcohol. °· Avoid situations where alcohol is served, including gatherings where others are drinking alcohol. °· Create a plan for what to do when you are tempted to use alcohol. °· Find hobbies or activities that you enjoy that do not include alcohol. °· Keep all follow-up visits as told by your health care provider. This is important. °How is this prevented? °· If you drink, limit alcohol intake to no more than 1 drink a day for nonpregnant women and 2 drinks a day for men. One drink equals 12 oz of beer, 5 oz of wine, or 1½ oz of hard liquor. °· If you have a mental health condition, get treatment and support. °· Do not give alcohol to  adolescents. °· If you are an adolescent: °? Do not drink alcohol. °? Do not be afraid to say no if someone offers you alcohol. Speak up about why you do not want to drink. You can be a positive role model for your friends and set a good example for those around you by not drinking alcohol. °? If your friends drink, spend time with others who do not drink alcohol. Make new friends who do not use alcohol. °? Find healthy ways to manage stress and emotions, such as meditation or deep breathing, exercise, spending time in nature, listening to music, or talking with a trusted friend or family member. °Contact a health care provider if: °· You are not able to take your medicines as told. °· Your symptoms get worse. °· You return to drinking alcohol (relapse) and your symptoms get worse. °Get help right away if: °· You have thoughts about hurting yourself or others. °If you ever feel like you may hurt yourself or others, or have thoughts about taking your own life, get help right away. You can go to your nearest emergency department or call: °· Your local emergency services (911 in the [place] °· A suicide crisis helpline, such as the National Suicide Prevention Lifeline at 1-800-273-8255. This is open 24 hours a day. °Summary °· Alcohol use disorder is when your drinking disrupts your daily   life. When you have this condition, you drink too much alcohol and you cannot control your drinking. °· Treatment may include detoxification, counseling, medicine, and support groups. °· Ask friends and family members not to offer you alcohol. Avoid situations where alcohol is served. °· Get help right away if you have thoughts about hurting yourself or others. °This information is not intended to replace advice given to you by your health care provider. Make sure you discuss any questions you have with your health care provider. °Document Revised: 12/21/2016 Document Reviewed: 10/06/2015 °Elsevier Patient Education © 2020 [employer]  [employer] ° °

## 2020-01-27 NOTE — Progress Notes (Signed)
Tomasa Hose as a scribe for [name], MD.,have documented all relevant documentation on the behalf of [name], MD,as directed by  [name], MD while in the presence of [name], MD. This visit occurred during the SARS-CoV-2 public health emergency.  Safety protocols were in place, including screening questions prior to the visit, additional usage of staff PPE, and extensive cleaning of exam room while observing appropriate contact time as indicated for disinfecting solutions.  Subjective:     Patient ID: Carlos [name] , male    DOB: [birth year] , 75 y.o.   MRN: [id number]   Chief Complaint  Patient presents with  . Anxiety    Pt wants to discuss alcohol     HPI  The patient is here today for an anxiety follow-up.  The patient reports no issues with current medication.  He admits that he has been dealing with a lot of stress. His adult son lives with him. He reports his son is dealing with some mental health issues. He realizes that he may be drinking too much alcohol. He admits that he stopped taking escitalopram. He is unable to articulate why, he did not experience any side effects. He was thinking he no longer needed the medication.   Anxiety Presents for follow-up visit. Symptoms include nervous/anxious behavior. Patient reports no compulsions, decreased concentration, dizziness, feeling of choking or palpitations.       Past Medical History:  Diagnosis Date  . Allergic rhinitis   . Atypical chest pain 09/16/2015  . Dysrhythmia    a-fib/flutter  . Elevated blood pressure 10/19/2015  . GERD (gastroesophageal reflux disease)   . Hypertension   . Insomnia   . Left inguinal hernia   . Mixed hyperlipidemia   . Pure hypercholesterolemia 10/02/2018  . PVC (premature ventricular contraction) 10/19/2015  . Typical atrial flutter (HCC) 10/02/2018     Family History  Problem Relation Age of Onset  . Healthy Mother   . Heart attack Father    . Pulmonary embolism Brother      Current Outpatient Medications:  .  CALCIUM PO, Take 1 tablet by mouth daily. 600 mg daily, Disp: , Rfl:  .  Cholecalciferol (VITAMIN D PO), Take 1 capsule by mouth daily. , Disp: , Rfl:  .  ELIQUIS 5 MG TABS tablet, Take 1 tablet by mouth twice daily, Disp: 180 tablet, Rfl: 0 .  MAGNESIUM PO, Take by mouth., Disp: , Rfl:  .  metoprolol tartrate (LOPRESSOR) 50 MG tablet, Take 1 tablet by mouth twice daily, Disp: 180 tablet, Rfl: 0 .  Multiple Vitamin (MULTIVITAMIN) capsule, Take 1 capsule by mouth daily., Disp: , Rfl:  .  Omega-3 Fatty Acids (FISH OIL PO), Take 1 capsule by mouth. EVERY OTHER DAY, Disp: , Rfl:  .  rosuvastatin (CRESTOR) 10 MG tablet, Take 1 tablet by mouth once daily, Disp: 90 tablet, Rfl: 2 .  zolpidem (AMBIEN) 10 MG tablet, Take 1 tablet (10 mg total) by mouth at bedtime as needed., Disp: 30 tablet, Rfl: 2 .  escitalopram (LEXAPRO) 10 MG tablet, Take 1 tablet by mouth once daily, Disp: 90 tablet, Rfl: 1   No Known Allergies   Review of Systems  Constitutional: Negative.  Negative for fatigue.  HENT: Negative.   Respiratory: Negative.   Cardiovascular: Negative.  Negative for palpitations.  Gastrointestinal: Negative.   Endocrine: Negative for polydipsia, polyphagia and polyuria.  Musculoskeletal: Negative.   Skin: Negative.   Neurological: Negative.  Negative for  dizziness and headaches.  Psychiatric/Behavioral: Positive for sleep disturbance. Negative for decreased concentration. The patient is nervous/anxious.      Today's Vitals   01/27/20 1114  BP: 118/64  Pulse: 68  Temp: 98 F (36.7 C)  TempSrc: Oral  Weight: 165 lb (74.8 kg)  Height: 5\' 9"  (1.753 m)   Body mass index is 24.37 kg/m.  Wt Readings from Last 3 Encounters:  01/27/20 165 lb (74.8 kg)  10/14/19 171 lb 3.2 oz (77.7 kg)  09/15/19 167 lb (75.8 kg)   Objective:  Physical Exam Vitals and nursing note reviewed.  Constitutional:      Appearance:  Normal appearance.  Cardiovascular:     Rate and Rhythm: Normal rate and regular rhythm.     Heart sounds: Normal heart sounds.  Pulmonary:     Effort: Pulmonary effort is normal.     Breath sounds: Normal breath sounds.  Skin:    General: Skin is warm.  Neurological:     General: No focal deficit present.     Mental Status: He is alert.  Psychiatric:        Mood and Affect: Mood normal.         Assessment And Plan:     1. Anxiety Comments: Chronic, he agrees to resume medication. He is encouraged to resume therapy. He will rto in 4-6 wks for f/u. If no improvement, will refer him to Psych.   2. Alcohol use disorder, mild, abuse Comments: Pt advised this is likely contributing to his anxiety. I offered to refer him to [facility] or other treatment programs, he declined. I will revisit at f/u visit.   3. Other insomnia Comments: Chronic. He is reminded that alcohol can alter sleep quality. Also advised to avoid alcohol when he takes zolpidem.    Patient was given opportunity to ask questions. Patient verbalized understanding of the plan and was able to repeat key elements of the plan. All questions were answered to their satisfaction.  09/17/19, MD   I, [name], MD, have reviewed all documentation for this visit. The documentation on 03/06/20 for the exam, diagnosis, procedures, and orders are all accurate and complete.  THE PATIENT IS ENCOURAGED TO PRACTICE SOCIAL DISTANCING DUE TO THE COVID-19 PANDEMIC.

## 2020-01-28 ENCOUNTER — Encounter: Payer: Self-pay | Admitting: Internal Medicine

## 2020-02-24 ENCOUNTER — Telehealth: Payer: Self-pay

## 2020-02-24 ENCOUNTER — Ambulatory Visit: Payer: Self-pay | Admitting: Internal Medicine

## 2020-02-24 NOTE — Telephone Encounter (Signed)
This nurse called to do AWV via telephone. Patient states that he will wait until Dr. Allyne Gee is available and do the AWV in person the same day.

## 2020-03-18 ENCOUNTER — Telehealth: Payer: Self-pay | Admitting: Internal Medicine

## 2020-03-18 NOTE — Telephone Encounter (Signed)
Left message for patient to call back and schedule Medicare Annual Wellness Visit (AWV) either virtually or in office.   Last AWV 02/19/19  please schedule at anytime with [facility]    This should be a 45 minute visit.

## 2020-03-23 ENCOUNTER — Ambulatory Visit (INDEPENDENT_AMBULATORY_CARE_PROVIDER_SITE_OTHER): Payer: Medicare PPO

## 2020-03-23 VITALS — Ht 70.0 in | Wt 164.0 lb

## 2020-03-23 DIAGNOSIS — Z0000 Encounter for general adult medical examination without abnormal findings: Secondary | ICD-10-CM | POA: Diagnosis not present

## 2020-03-23 NOTE — Patient Instructions (Signed)
[name] , Thank you for taking time to come for your Medicare Wellness Visit. I appreciate your ongoing commitment to your health goals. Please review the following plan we discussed and let me know if I can assist you in the future.   Screening recommendations/referrals: Colonoscopy: not required Recommended yearly ophthalmology/optometry visit for glaucoma screening and checkup Recommended yearly dental visit for hygiene and checkup  Vaccinations: Influenza vaccine: decline Pneumococcal vaccine: completed 10/01/2011 Tdap vaccine: completed 02/01/2018, due 02/02/2028 Shingles vaccine: discussed   Covid-19:  01/27/2020, 09/06/2019, 03/31/2019, 03/08/2019  Advanced directives: Please bring a copy of your POA (Power of Attorney) and/or Living Will to your next appointment.   Conditions/risks identified: none  Next appointment: Follow up in one year for your annual wellness visit.   Preventive Care 75 Years and Older, Male Preventive care refers to lifestyle choices and visits with your health care provider that can promote health and wellness. What does preventive care include?  A yearly physical exam. This is also called an annual well check.  Dental exams once or twice a year.  Routine eye exams. Ask your health care provider how often you should have your eyes checked.  Personal lifestyle choices, including:  Daily care of your teeth and gums.  Regular physical activity.  Eating a healthy diet.  Avoiding tobacco and drug use.  Limiting alcohol use.  Practicing safe sex.  Taking low doses of aspirin every day.  Taking vitamin and mineral supplements as recommended by your health care provider. What happens during an annual well check? The services and screenings done by your health care provider during your annual well check will depend on your age, overall health, lifestyle risk factors, and family history of disease. Counseling  Your health care provider may ask you  questions about your:  Alcohol use.  Tobacco use.  Drug use.  Emotional well-being.  Home and relationship well-being.  Sexual activity.  Eating habits.  History of falls.  Memory and ability to understand (cognition).  Work and work Astronomer. Screening  You may have the following tests or measurements:  Height, weight, and BMI.  Blood pressure.  Lipid and cholesterol levels. These may be checked every 5 years, or more frequently if you are over 29 years old.  Skin check.  Lung cancer screening. You may have this screening every year starting at age 57 if you have a 30-pack-year history of smoking and currently smoke or have quit within the past 15 years.  Fecal occult blood test (FOBT) of the stool. You may have this test every year starting at age 46.  Flexible sigmoidoscopy or colonoscopy. You may have a sigmoidoscopy every 5 years or a colonoscopy every 10 years starting at age 40.  Prostate cancer screening. Recommendations will vary depending on your family history and other risks.  Hepatitis C blood test.  Hepatitis B blood test.  Sexually transmitted disease (STD) testing.  Diabetes screening. This is done by checking your blood sugar (glucose) after you have not eaten for a while (fasting). You may have this done every 1-3 years.  Abdominal aortic aneurysm (AAA) screening. You may need this if you are a current or former smoker.  Osteoporosis. You may be screened starting at age 97 if you are at high risk. Talk with your health care provider about your test results, treatment options, and if necessary, the need for more tests. Vaccines  Your health care provider may recommend certain vaccines, such as:  Influenza vaccine. This is recommended  every year.  Tetanus, diphtheria, and acellular pertussis (Tdap, Td) vaccine. You may need a Td booster every 10 years.  Zoster vaccine. You may need this after age 39.  Pneumococcal 13-valent conjugate  (PCV13) vaccine. One dose is recommended after age 16.  Pneumococcal polysaccharide (PPSV23) vaccine. One dose is recommended after age 70. Talk to your health care provider about which screenings and vaccines you need and how often you need them. This information is not intended to replace advice given to you by your health care provider. Make sure you discuss any questions you have with your health care provider. Document Released: 02/04/2015 Document Revised: 09/28/2015 Document Reviewed: 11/09/2014 Elsevier Interactive Patient Education  2017 [place] Prevention in the Home Falls can cause injuries. They can happen to people of all ages. There are many things you can do to make your home safe and to help prevent falls. What can I do on the outside of my home?  Regularly fix the edges of walkways and driveways and fix any cracks.  Remove anything that might make you trip as you walk through a door, such as a raised step or threshold.  Trim any bushes or trees on the path to your home.  Use bright outdoor lighting.  Clear any walking paths of anything that might make someone trip, such as rocks or tools.  Regularly check to see if handrails are loose or broken. Make sure that both sides of any steps have handrails.  Any raised decks and porches should have guardrails on the edges.  Have any leaves, snow, or ice cleared regularly.  Use sand or salt on walking paths during winter.  Clean up any spills in your garage right away. This includes oil or grease spills. What can I do in the bathroom?  Use night lights.  Install grab bars by the toilet and in the tub and shower. Do not use towel bars as grab bars.  Use non-skid mats or decals in the tub or shower.  If you need to sit down in the shower, use a plastic, non-slip stool.  Keep the floor dry. Clean up any water that spills on the floor as soon as it happens.  Remove soap buildup in the tub or shower  regularly.  Attach bath mats securely with double-sided non-slip rug tape.  Do not have throw rugs and other things on the floor that can make you trip. What can I do in the bedroom?  Use night lights.  Make sure that you have a light by your bed that is easy to reach.  Do not use any sheets or blankets that are too big for your bed. They should not hang down onto the floor.  Have a firm chair that has side arms. You can use this for support while you get dressed.  Do not have throw rugs and other things on the floor that can make you trip. What can I do in the kitchen?  Clean up any spills right away.  Avoid walking on wet floors.  Keep items that you use a lot in easy-to-reach places.  If you need to reach something above you, use a strong step stool that has a grab bar.  Keep electrical cords out of the way.  Do not use floor polish or wax that makes floors slippery. If you must use wax, use non-skid floor wax.  Do not have throw rugs and other things on the floor that can make you trip. What  can I do with my stairs?  Do not leave any items on the stairs.  Make sure that there are handrails on both sides of the stairs and use them. Fix handrails that are broken or loose. Make sure that handrails are as long as the stairways.  Check any carpeting to make sure that it is firmly attached to the stairs. Fix any carpet that is loose or worn.  Avoid having throw rugs at the top or bottom of the stairs. If you do have throw rugs, attach them to the floor with carpet tape.  Make sure that you have a light switch at the top of the stairs and the bottom of the stairs. If you do not have them, ask someone to add them for you. What else can I do to help prevent falls?  Wear shoes that:  Do not have high heels.  Have rubber bottoms.  Are comfortable and fit you well.  Are closed at the toe. Do not wear sandals.  If you use a stepladder:  Make sure that it is fully  opened. Do not climb a closed stepladder.  Make sure that both sides of the stepladder are locked into place.  Ask someone to hold it for you, if possible.  Clearly mark and make sure that you can see:  Any grab bars or handrails.  First and last steps.  Where the edge of each step is.  Use tools that help you move around (mobility aids) if they are needed. These include:  Canes.  Walkers.  Scooters.  Crutches.  Turn on the lights when you go into a dark area. Replace any light bulbs as soon as they burn out.  Set up your furniture so you have a clear path. Avoid moving your furniture around.  If any of your floors are uneven, fix them.  If there are any pets around you, be aware of where they are.  Review your medicines with your doctor. Some medicines can make you feel dizzy. This can increase your chance of falling. Ask your doctor what other things that you can do to help prevent falls. This information is not intended to replace advice given to you by your health care provider. Make sure you discuss any questions you have with your health care provider. Document Released: 11/04/2008 Document Revised: 06/16/2015 Document Reviewed: 02/12/2014 Elsevier Interactive Patient Education  2017 [employer].

## 2020-03-23 NOTE — Progress Notes (Signed)
I connected with Carlos [name] today by telephone and verified that I am speaking with the correct person using two identifiers. Location patient: home Location provider: work Persons participating in the virtual visit: Carlos [name], provider.   I discussed the limitations, risks, security and privacy concerns of performing an evaluation and management service by telephone and the availability of in person appointments. I also discussed with the patient that there may be a patient responsible charge related to this service. The patient expressed understanding and verbally consented to this telephonic visit.    Interactive audio and video telecommunications were attempted between this provider and patient, however failed, due to patient having technical difficulties OR patient did not have access to video capability.  We continued and completed visit with audio only.     Vital signs may be patient reported or missing.  Subjective:   Carlos [name] is a 75 y.o. male who presents for Medicare Annual/Subsequent preventive examination.  Review of Systems     Cardiac Risk Factors include: advanced age (>2555men, 46>65 women);hypertension;male gender     Objective:    Today's Vitals   03/23/20 1214  Weight: 164 lb (74.4 kg)  Height: 5\' 10"  (1.778 m)   Body mass index is 23.53 kg/m.  Advanced Directives 03/23/2020 07/14/2019 02/19/2019 09/03/2018 08/26/2018 07/24/2018 06/24/2018  Does Patient Have a Medical Advance Directive? Yes No Yes Yes Yes Yes Yes  Type of Estate agentAdvance Directive Healthcare Power of [place]orney;Living will - Healthcare Power of [place];Living will - Healthcare Power of [place];Living will Healthcare Power of [place];Living will Healthcare Power of Attorney  Does patient want to make changes to medical advance directive? - - - No - Patient declined No - Patient declined - -  Copy of Healthcare Power of Attorney in Chart? No - copy requested - No - copy requested No - copy requested  No - copy requested No - copy requested No - copy requested    Current Medications (verified) Outpatient Encounter Medications as of 03/23/2020  Medication Sig  . CALCIUM PO Take 1 tablet by mouth daily. 600 mg daily  . Cholecalciferol (VITAMIN D PO) Take 1 capsule by mouth daily.   Marland Kitchen. ELIQUIS 5 MG TABS tablet Take 1 tablet by mouth twice daily  . MAGNESIUM PO Take by mouth.  . metoprolol tartrate (LOPRESSOR) 50 MG tablet Take 1 tablet by mouth twice daily  . Multiple Vitamin (MULTIVITAMIN) capsule Take 1 capsule by mouth daily.  . Omega-3 Fatty Acids (FISH OIL PO) Take 1 capsule by mouth. EVERY OTHER DAY  . rosuvastatin (CRESTOR) 10 MG tablet Take 1 tablet by mouth once daily  . zolpidem (AMBIEN) 10 MG tablet Take 1 tablet (10 mg total) by mouth at bedtime as needed.  Marland Kitchen. escitalopram (LEXAPRO) 10 MG tablet Take 1 tablet by mouth once daily (Patient not taking: Reported on 03/23/2020)   No facility-administered encounter medications on file as of 03/23/2020.    Allergies (verified) Patient has no known allergies.   History: Past Medical History:  Diagnosis Date  . Allergic rhinitis   . Atypical chest pain 09/16/2015  . Dysrhythmia    a-fib/flutter  . Elevated blood pressure 10/19/2015  . GERD (gastroesophageal reflux disease)   . Hypertension   . Insomnia   . Left inguinal hernia   . Mixed hyperlipidemia   . Pure hypercholesterolemia 10/02/2018  . PVC (premature ventricular contraction) 10/19/2015  . Typical atrial flutter (HCC) 10/02/2018   Past Surgical History:  Procedure Laterality Date  . ASD REPAIR  1994  . INGUINAL HERNIA REPAIR  1994  . INGUINAL HERNIA REPAIR Left 09/03/2018   Procedure: LEFT INGUINAL HERNIA REPAIR WITH MESH;  Surgeon: Manus Rudd, MD;  Location: [facility];  Service: General;  Laterality: Left;   Family History  Problem Relation Age of Onset  . Healthy Mother   . Heart attack Father   . Pulmonary embolism Brother    Social History    Socioeconomic History  . Marital status: Divorced    Spouse name: Not on file  . Number of children: Not on file  . Years of education: Not on file  . Highest education level: Not on file  Occupational History  . Occupation: retired  Tobacco Use  . Smoking status: Former Smoker    Packs/day: 0.50    Years: 9.00    Pack years: 4.50    Types: Cigarettes    Quit date: 01/22/2006    Years since quitting: 14.1  . Smokeless tobacco: Never Used  Vaping Use  . Vaping Use: Never used  Substance and Sexual Activity  . Alcohol use: Yes    Comment: 10-12 drinks per week  . Drug use: No  . Sexual activity: Not Currently  Other Topics Concern  . Not on file  Social History Narrative   Drinks 1-2 cups of caffeine daily.   Social Determinants of Health   Financial Resource Strain: Low Risk   . Difficulty of Paying Living Expenses: Not hard at all  Food Insecurity: No Food Insecurity  . Worried About Programme researcher, broadcasting/film/video in the Last Year: Never true  . Ran Out of Food in the Last Year: Never true  Transportation Needs: No Transportation Needs  . Lack of Transportation (Medical): No  . Lack of Transportation (Non-Medical): No  Physical Activity: Insufficiently Active  . Days of Exercise per Week: 3 days  . Minutes of Exercise per Session: 30 min  Stress: No Stress Concern Present  . Feeling of Stress : Not at all  Social Connections: Not on file    Tobacco Counseling Counseling given: Not Answered   Clinical Intake:  Pre-visit preparation completed: Yes  Pain : No/denies pain     Nutritional Status: BMI of 19-24  Normal Nutritional Risks: None Diabetes: No  How often do you need to have someone help you when you read instructions, pamphlets, or other written materials from your doctor or pharmacy?: 1 - Never What is the last grade level you completed in school?: PhD  Diabetic? no  Interpreter Needed?: No  Information entered by :: NAllen LPN   Activities of  Daily Living In your present state of health, do you have any difficulty performing the following activities: 03/23/2020  Hearing? N  Vision? N  Difficulty concentrating or making decisions? N  Walking or climbing stairs? N  Dressing or bathing? N  Doing errands, shopping? N  Preparing Food and eating ? N  Using the Toilet? N  In the past six months, have you accidently leaked urine? N  Do you have problems with loss of bowel control? N  Managing your Medications? N  Managing your Finances? N  Housekeeping or managing your Housekeeping? N  Some recent data might be hidden    Patient Care Team: Dorothyann Peng, MD as PCP - General (Internal Medicine) Chilton Si, MD as PCP - Cardiology (Cardiology)  Indicate any recent Medical Services you may have received from other than [facility] providers in the past year (date may be approximate).  Assessment:   This is a routine wellness examination for Carlos [name].  Hearing/Vision screen  Hearing Screening   125Hz  250Hz  500Hz  1000Hz  2000Hz  3000Hz  4000Hz  6000Hz  8000Hz   Right ear:           Left ear:           Vision Screening Comments: Regular eye exams, Dr.  Dietary issues and exercise activities discussed: Current Exercise Habits: Home exercise routine, Type of exercise: walking, Time (Minutes): 30, Frequency (Times/Week): 3, Weekly Exercise (Minutes/Week): 90  Goals    . Patient Stated     No goals    . Patient Stated     02/19/2019, no goals    . Patient Stated     03/23/2020, no goals      Depression Screen PHQ 2/9 Scores 03/23/2020 02/19/2019 12/02/2018 10/13/2018 07/24/2018 07/14/2018 06/03/2018  PHQ - 2 Score 0 0 6 0 0 0 0  PHQ- 9 Score - 0 12 - - - -    Fall Risk Fall Risk  03/23/2020 02/19/2019 12/02/2018 10/13/2018 07/24/2018  Falls in the past year? 0 0 0 0 0  Risk for fall due to : Medication side effect Medication side effect - - Medication side effect  Follow up Falls evaluation completed;Education provided;Falls  prevention discussed Falls evaluation completed;Education provided;Falls prevention discussed - - Falls evaluation completed;Education provided;Falls prevention discussed    FALL RISK PREVENTION PERTAINING TO THE HOME:  Any stairs in or around the home? Yes  If so, are there any without handrails? No  Home free of loose throw rugs in walkways, pet beds, electrical cords, etc? Yes  Adequate lighting in your home to reduce risk of falls? Yes   ASSISTIVE DEVICES UTILIZED TO PREVENT FALLS:  Life alert? No  Use of a cane, walker or w/c? No  Grab bars in the bathroom? No  Shower chair or bench in shower? No  Elevated toilet seat or a handicapped toilet? Yes   TIMED UP AND GO:  Was the test performed? No . .    Cognitive Function:     6CIT Screen 03/23/2020 02/19/2019 07/24/2018  What Year? 0 points 0 points 0 points  What month? 0 points 0 points 0 points  What time? 0 points 0 points 0 points  Count back from 20 0 points 0 points 0 points  Months in reverse 0 points 0 points 0 points  Repeat phrase 2 points 2 points 0 points  Total Score 2 2 0    Immunizations Immunization History  Administered Date(s) Administered  . PFIZER(Purple Top)SARS-COV-2 Vaccination 03/08/2019, 03/31/2019, 01/27/2020  . Tdap 02/01/2018  . Unspecified SARS-COV-2 Vaccination 09/06/2019    TDAP status: Up to date  Flu Vaccine status: Declined, Education has been provided regarding the importance of this vaccine but patient still declined. Advised may receive this vaccine at local pharmacy or Health Dept. Aware to provide a copy of the vaccination record if obtained from local pharmacy or Health Dept. Verbalized acceptance and understanding.  Pneumococcal vaccine status: Up to date  Covid-19 vaccine status: Completed vaccines  Qualifies for Shingles Vaccine? Yes   Zostavax completed No   Shingrix Completed?: No.    Education has been provided regarding the importance of this vaccine. Patient has been  advised to call insurance company to determine out of pocket expense if they have not yet received this vaccine. Advised may also receive vaccine at local pharmacy or Health Dept. Verbalized acceptance and understanding.  Screening Tests Health Maintenance  Topic Date  Due  . INFLUENZA VACCINE  04/21/2020 (Originally 08/23/2019)  . TETANUS/TDAP  02/02/2028  . COLONOSCOPY (Pts 45-60yrs Insurance coverage will need to be confirmed)  04/19/2029  . COVID-19 Vaccine  Completed  . Hepatitis C Screening  Completed  . PNA vac Low Risk Adult  Completed  . HPV VACCINES  Aged Out    Health Maintenance  There are no preventive care reminders to display for this patient.  Colorectal cancer screening: No longer required.   Lung Cancer Screening: (Low Dose CT Chest recommended if Age 54-80 years, 30 pack-year currently smoking OR have quit w/in 15years.) does not qualify.   Lung Cancer Screening Referral: no  Additional Screening:  Hepatitis C Screening: does qualify; Completed 01/28/2018  Vision Screening: Recommended annual ophthalmology exams for early detection of glaucoma and other disorders of the eye. Is the patient up to date with their annual eye exam?  Yes  Who is the provider or what is the name of the office in which the patient attends annual eye exams? Dr. Cathey Endow If pt is not established with a provider, would they like to be referred to a provider to establish care? No .   Dental Screening: Recommended annual dental exams for proper oral hygiene  Community Resource Referral / Chronic Care Management: CRR required this visit?  No   CCM required this visit?  No      Plan:     I have personally reviewed and noted the following in the patient's chart:   . Medical and social history . Use of alcohol, tobacco or illicit drugs  . Current medications and supplements . Functional ability and status . Nutritional status . Physical activity . Advanced directives . List of other  physicians . Hospitalizations, surgeries, and ER visits in previous 12 months . Vitals . Screenings to include cognitive, depression, and falls . Referrals and appointments  In addition, I have reviewed and discussed with patient certain preventive protocols, quality metrics, and best practice recommendations. A written personalized care plan for preventive services as well as general preventive health recommendations were provided to patient.     Barb Merino, LPN   2/0/9470   Nurse Notes:

## 2020-03-24 ENCOUNTER — Other Ambulatory Visit: Payer: Self-pay | Admitting: Internal Medicine

## 2020-03-29 ENCOUNTER — Other Ambulatory Visit: Payer: Self-pay | Admitting: Cardiovascular Disease

## 2020-04-04 ENCOUNTER — Encounter: Payer: Self-pay | Admitting: Internal Medicine

## 2020-04-04 ENCOUNTER — Other Ambulatory Visit: Payer: Self-pay

## 2020-04-04 ENCOUNTER — Ambulatory Visit (INDEPENDENT_AMBULATORY_CARE_PROVIDER_SITE_OTHER): Payer: Medicare PPO | Admitting: Internal Medicine

## 2020-04-04 VITALS — BP 114/82 | HR 95 | Temp 98.0°F | Ht 68.6 in | Wt 164.8 lb

## 2020-04-04 DIAGNOSIS — F419 Anxiety disorder, unspecified: Secondary | ICD-10-CM

## 2020-04-04 DIAGNOSIS — F515 Nightmare disorder: Secondary | ICD-10-CM | POA: Diagnosis not present

## 2020-04-04 DIAGNOSIS — E7800 Pure hypercholesterolemia, unspecified: Secondary | ICD-10-CM | POA: Diagnosis not present

## 2020-04-04 NOTE — Progress Notes (Signed)
I,Katawbba Wiggins,acting as a Neurosurgeon for [name], MD.,have documented all relevant documentation on the behalf of [name], MD,as directed by  [name], MD while in the presence of [name], MD.  This visit occurred during the SARS-CoV-2 public health emergency.  Safety protocols were in place, including screening questions prior to the visit, additional usage of staff PPE, and extensive cleaning of exam room while observing appropriate contact time as indicated for disinfecting solutions.  Subjective:     Patient ID: Carlos [name] , male    DOB: January 03, 1946 , 75 y.o.   MRN: [id number]   Chief Complaint  Patient presents with  . Hyperlipidemia  . Anxiety    HPI  The patient is here today for an anxiety follow-up.  The patient reports he stopped the Lexapro because it was causing him to have nightmares. He is not currently in therapy.   Anxiety Presents for follow-up visit. Symptoms include nervous/anxious behavior. Patient reports no compulsions, decreased concentration, feeling of choking, palpitations or suicidal ideas. The severity of symptoms is moderate.   Compliance with medications is 0-25%.     Past Medical History:  Diagnosis Date  . Allergic rhinitis   . Atypical chest pain 09/16/2015  . Dysrhythmia    a-fib/flutter  . Elevated blood pressure 10/19/2015  . GERD (gastroesophageal reflux disease)   . Hypertension   . Insomnia   . Left inguinal hernia   . Mixed hyperlipidemia   . Pure hypercholesterolemia 10/02/2018  . PVC (premature ventricular contraction) 10/19/2015  . Typical atrial flutter (HCC) 10/02/2018     Family History  Problem Relation Age of Onset  . Healthy Mother   . Heart attack Father   . Pulmonary embolism Brother      Current Outpatient Medications:  .  CALCIUM PO, Take 1 tablet by mouth daily. 600 mg daily, Disp: , Rfl:  .  levocetirizine (XYZAL) 5 MG tablet, TAKE 1 TABLET BY MOUTH ONCE DAILY IN THE EVENING,  Disp: 90 tablet, Rfl: 0 .  MAGNESIUM PO, Take by mouth., Disp: , Rfl:  .  metoprolol tartrate (LOPRESSOR) 50 MG tablet, Take 1 tablet by mouth twice daily, Disp: 180 tablet, Rfl: 0 .  Multiple Vitamin (MULTIVITAMIN) capsule, Take 1 capsule by mouth daily., Disp: , Rfl:  .  Omega-3 Fatty Acids (FISH OIL PO), Take 1 capsule by mouth. EVERY OTHER DAY, Disp: , Rfl:  .  rosuvastatin (CRESTOR) 10 MG tablet, Take 1 tablet by mouth once daily, Disp: 90 tablet, Rfl: 2 .  zolpidem (AMBIEN) 10 MG tablet, Take 1 tablet (10 mg total) by mouth at bedtime as needed., Disp: 30 tablet, Rfl: 2 .  Cholecalciferol (VITAMIN D PO), Take 1 capsule by mouth daily. , Disp: , Rfl:  .  ELIQUIS 5 MG TABS tablet, Take 1 tablet by mouth twice daily, Disp: 180 tablet, Rfl: 0   No Known Allergies   Review of Systems  Constitutional: Negative.   Respiratory: Negative.   Cardiovascular: Negative.  Negative for palpitations.  Gastrointestinal: Negative.   Psychiatric/Behavioral: Positive for sleep disturbance. Negative for decreased concentration and suicidal ideas. The patient is nervous/anxious.   All other systems reviewed and are negative.    Today's Vitals   04/04/20 1538  BP: 114/82  Pulse: 95  Temp: 98 F (36.7 C)  TempSrc: Oral  Weight: 164 lb 12.8 oz (74.8 kg)  Height: 5' 8.6" (1.742 m)   Body mass index is 24.62 kg/m.  Wt Readings from  Last 3 Encounters:  04/04/20 164 lb 12.8 oz (74.8 kg)  03/23/20 164 lb (74.4 kg)  01/27/20 165 lb (74.8 kg)   Objective:  Physical Exam Vitals and nursing note reviewed.  Constitutional:      Appearance: Normal appearance.  HENT:     Head: Normocephalic and atraumatic.     Nose:     Comments: Masked     Mouth/Throat:     Comments: Masked  Cardiovascular:     Rate and Rhythm: Normal rate and regular rhythm.     Heart sounds: Normal heart sounds.  Pulmonary:     Effort: Pulmonary effort is normal.     Breath sounds: Normal breath sounds.  Musculoskeletal:      Cervical back: Normal range of motion.  Skin:    General: Skin is warm.  Neurological:     General: No focal deficit present.     Mental Status: He is alert and oriented to person, place, and time.         Assessment And Plan:     1. Anxiety Comments: Chronic. He may benefit from magnesium supplementation. He agrees to referral to Psych for further evaluation/treatment.  - Ambulatory referral to Psychiatry  2. Nightmare Comments: These have resolved since he stopped escitalopram. Would consider Genesight testing, will now defer to Psych.  - Ambulatory referral to Psychiatry  3. Pure hypercholesterolemia Comments: I will check non-fasting lipid panel. I will increase rosuvastatin to 20mg  daily if not at goal. PT advised to take 2 tabs of current 10mg  supply if needed. - Lipid panel - ALT   Patient was given opportunity to ask questions. Patient verbalized understanding of the plan and was able to repeat key elements of the plan. All questions were answered to their satisfaction.   I, , MD, have reviewed all documentation for this visit. The documentation on 04/04/20 for the exam, diagnosis, procedures, and orders are all accurate and complete.   IF YOU HAVE BEEN REFERRED TO A SPECIALIST, IT MAY TAKE 1-2 WEEKS TO SCHEDULE/PROCESS THE REFERRAL. IF YOU HAVE NOT HEARD FROM US/SPECIALIST IN TWO WEEKS, PLEASE GIVE [name] A CALL AT [phone].   THE PATIENT IS ENCOURAGED TO PRACTICE SOCIAL DISTANCING DUE TO THE COVID-19 PANDEMIC.

## 2020-04-05 LAB — ALT: ALT: 21 IU/L (ref 0–44)

## 2020-04-05 LAB — LIPID PANEL
Chol/HDL Ratio: 4.1 ratio (ref 0.0–5.0)
Cholesterol, Total: 158 mg/dL (ref 100–199)
HDL: 39 mg/dL — ABNORMAL LOW (ref >39)
LDL Chol Calc (NIH): 73 mg/dL (ref 0–99)
Triglycerides: 288 mg/dL — ABNORMAL HIGH (ref 0–149)
VLDL Cholesterol Cal: 46 mg/dL — ABNORMAL HIGH (ref 5–40)

## 2020-04-06 NOTE — Patient Instructions (Signed)
High Cholesterol  High cholesterol is a condition in which the blood has high levels of a white, waxy substance similar to fat (cholesterol). The liver makes all the cholesterol that the body needs. The human body needs small amounts of cholesterol to help build cells. A person gets extra or excess cholesterol from the food that he or she eats. The blood carries cholesterol from the liver to the rest of the body. If you have high cholesterol, deposits (plaques) may build up on the walls of your arteries. Arteries are the blood vessels that carry blood away from your heart. These plaques make the arteries narrow and stiff. Cholesterol plaques increase your risk for heart attack and stroke. Work with your health care provider to keep your cholesterol levels in a healthy range. What increases the risk? The following factors may make you more likely to develop this condition:  Eating foods that are high in animal fat (saturated fat) or cholesterol.  Being overweight.  Not getting enough exercise.  A family history of high cholesterol (familial hypercholesterolemia).  Use of tobacco products.  Having diabetes. What are the signs or symptoms? There are no symptoms of this condition. How is this diagnosed? This condition may be diagnosed based on the results of a blood test.  If you are older than 75 years of age, your health care provider may check your cholesterol levels every 4-6 years.  You may be checked more often if you have high cholesterol or other risk factors for heart disease. The blood test for cholesterol measures:  "Bad" cholesterol, or LDL cholesterol. This is the main type of cholesterol that causes heart disease. The desired level is less than 100 mg/dL.  "Good" cholesterol, or HDL cholesterol. HDL helps protect against heart disease by cleaning the arteries and carrying the LDL to the liver for processing. The desired level for HDL is 60 mg/dL or higher.  Triglycerides.  These are fats that your body can store or burn for energy. The desired level is less than 150 mg/dL.  Total cholesterol. This measures the total amount of cholesterol in your blood and includes LDL, HDL, and triglycerides. The desired level is less than 200 mg/dL. How is this treated? This condition may be treated with:  Diet changes. You may be asked to eat foods that have more fiber and less saturated fats or added sugar.  Lifestyle changes. These may include regular exercise, maintaining a healthy weight, and quitting use of tobacco products.  Medicines. These are given when diet and lifestyle changes have not worked. You may be prescribed a statin medicine to help lower your cholesterol levels. Follow these instructions at home: Eating and drinking  Eat a healthy, balanced diet. This diet includes: ? Daily servings of a variety of fresh, frozen, or canned fruits and vegetables. ? Daily servings of whole grain foods that are rich in fiber. ? Foods that are low in saturated fats and trans fats. These include poultry and fish without skin, lean cuts of meat, and low-fat dairy products. ? A variety of fish, especially oily fish that contain omega-3 fatty acids. Aim to eat fish at least 2 times a week.  Avoid foods and drinks that have added sugar.  Use healthy cooking methods, such as roasting, grilling, broiling, baking, poaching, steaming, and stir-frying. Do not fry your food except for stir-frying.   Lifestyle  Get regular exercise. Aim to exercise for a total of 150 minutes a week. Increase your activity level by doing activities   such as gardening, walking, and taking the stairs.  Do not use any products that contain nicotine or tobacco, such as cigarettes, e-cigarettes, and chewing tobacco. If you need help quitting, ask your health care provider.   General instructions  Take over-the-counter and prescription medicines only as told by your health care provider.  Keep all  follow-up visits as told by your health care provider. This is important. Where to find more information  American Heart Association: www.heart.org  National Heart, Lung, and Blood Institute: www.nhlbi.nih.gov Contact a health care provider if:  You have trouble achieving or maintaining a healthy diet or weight.  You are starting an exercise program.  You are unable to stop smoking. Get help right away if:  You have chest pain.  You have trouble breathing.  You have any symptoms of a stroke. "BE FAST" is an easy way to remember the main warning signs of a stroke: ? B - Balance. Signs are dizziness, sudden trouble walking, or loss of balance. ? E - Eyes. Signs are trouble seeing or a sudden change in vision. ? F - Face. Signs are sudden weakness or numbness of the face, or the face or eyelid drooping on one side. ? A - Arms. Signs are weakness or numbness in an arm. This happens suddenly and usually on one side of the body. ? S - Speech. Signs are sudden trouble speaking, slurred speech, or trouble understanding what people say. ? T - Time. Time to call emergency services. Write down what time symptoms started.  You have other signs of a stroke, such as: ? A sudden, severe headache with no known cause. ? Nausea or vomiting. ? Seizure. These symptoms may represent a serious problem that is an emergency. Do not wait to see if the symptoms will go away. Get medical help right away. Call your local emergency services (911 in the [place] Do not drive yourself to the hospital. Summary  Cholesterol plaques increase your risk for heart attack and stroke. Work with your health care provider to keep your cholesterol levels in a healthy range.  Eat a healthy, balanced diet, get regular exercise, and maintain a healthy weight.  Do not use any products that contain nicotine or tobacco, such as cigarettes, e-cigarettes, and chewing tobacco.  Get help right away if you have any symptoms of a  stroke. This information is not intended to replace advice given to you by your health care provider. Make sure you discuss any questions you have with your health care provider. Document Revised: 12/08/2018 Document Reviewed: 12/08/2018 Elsevier Patient Education  2021 Elsevier Inc.  

## 2020-04-11 ENCOUNTER — Other Ambulatory Visit: Payer: Self-pay | Admitting: Internal Medicine

## 2020-04-12 ENCOUNTER — Other Ambulatory Visit: Payer: Self-pay | Admitting: Internal Medicine

## 2020-04-12 ENCOUNTER — Other Ambulatory Visit: Payer: Self-pay | Admitting: Nurse Practitioner

## 2020-04-12 MED ORDER — ZOLPIDEM TARTRATE 10 MG PO TABS: 10.0000 mg | ORAL_TABLET | Freq: Every evening | ORAL | 2 refills | Status: DC | PRN

## 2020-04-13 ENCOUNTER — Telehealth: Payer: Self-pay

## 2020-04-13 NOTE — Telephone Encounter (Signed)
I called the pt because Dr. Allyne Gee wanted to know if the pt is willing to take the 5 mg of zolpidem, she is now unable to writhe higher dose for pts over 75 years old.

## 2020-04-18 DIAGNOSIS — H524 Presbyopia: Secondary | ICD-10-CM | POA: Diagnosis not present

## 2020-04-18 DIAGNOSIS — H2513 Age-related nuclear cataract, bilateral: Secondary | ICD-10-CM | POA: Diagnosis not present

## 2020-04-18 DIAGNOSIS — H40013 Open angle with borderline findings, low risk, bilateral: Secondary | ICD-10-CM | POA: Diagnosis not present

## 2020-05-04 ENCOUNTER — Encounter: Payer: Self-pay | Admitting: Internal Medicine

## 2020-06-02 ENCOUNTER — Emergency Department (HOSPITAL_COMMUNITY): Payer: Medicare PPO

## 2020-06-02 ENCOUNTER — Emergency Department (HOSPITAL_COMMUNITY)
Admission: EM | Admit: 2020-06-02 | Discharge: 2020-06-02 | Disposition: A | Discharge status: Home / Self Care | Payer: Medicare PPO | Attending: Emergency Medicine | Admitting: Emergency Medicine

## 2020-06-02 ENCOUNTER — Encounter (HOSPITAL_COMMUNITY): Payer: Self-pay

## 2020-06-02 DIAGNOSIS — Z87891 Personal history of nicotine dependence: Secondary | ICD-10-CM | POA: Diagnosis not present

## 2020-06-02 DIAGNOSIS — Z79899 Other long term (current) drug therapy: Secondary | ICD-10-CM | POA: Insufficient documentation

## 2020-06-02 DIAGNOSIS — M7732 Calcaneal spur, left foot: Secondary | ICD-10-CM | POA: Diagnosis not present

## 2020-06-02 DIAGNOSIS — Z7901 Long term (current) use of anticoagulants: Secondary | ICD-10-CM | POA: Diagnosis not present

## 2020-06-02 DIAGNOSIS — I480 Paroxysmal atrial fibrillation: Secondary | ICD-10-CM | POA: Diagnosis not present

## 2020-06-02 DIAGNOSIS — I10 Essential (primary) hypertension: Secondary | ICD-10-CM | POA: Insufficient documentation

## 2020-06-02 DIAGNOSIS — M25572 Pain in left ankle and joints of left foot: Secondary | ICD-10-CM | POA: Insufficient documentation

## 2020-06-02 DIAGNOSIS — M79672 Pain in left foot: Secondary | ICD-10-CM | POA: Diagnosis present

## 2020-06-02 NOTE — Progress Notes (Signed)
Orthopedic Tech Progress Note Patient Details:  Carlos [name] [birth year] [id number]  Ortho Devices Type of Ortho Device: Ace wrap Ortho Device/Splint Location: left Ortho Device/Splint Interventions: Application   Post Interventions Patient Tolerated: Well Instructions Provided: Care of device   Saul Fordyce 06/02/2020, 12:42 PM

## 2020-06-02 NOTE — Discharge Instructions (Addendum)
You were seen in the emergency department today for your left foot/ankle pain.  There are no signs of infection or gout on your physical exam, which is reassuring.  Your x-ray did reveal a large heel spur, which is an overgrowth of bone on your heel that can cause irritation and pain.  You may alternate Tylenol and ibuprofen every 3 hours as needed to manage your pain.  Additionally may ice the area.  Below is the contact information for the podiatrist (foot specialist) with whom you may follow-up.  Additionally listed is the contact information for the orthopedic doctor, the bone specialist, whom you may see if podiatry is unable to see you quickly.  Return to the ER if you develop any numbness, tingling, weakness in your foot, or any other new severe symptoms.

## 2020-06-02 NOTE — ED Provider Notes (Signed)
[facility]-EMERGENCY DEPT Provider Note   CSN: [id number] Arrival date & time: 06/02/20  1032     History Chief Complaint  Patient presents with  . Ankle Pain    Carlos [name] is a 75 y.o. male who presents with concern for left ankle/foot pain that started yesterday and worsened this morning.  Patient denies any history of injury to the foot, no prior injury.  Denies any recent breaks in the skin, warmth to the touch or swelling of the area.  Pain is worse with ambulation or any kind of downward pressure on the heel.  He does history of gout in the big toe of the same foot.  He denies any numbness, tingling, weakness in his foot.  No history of immunocompromising disease.   Personally reviewed the patient's medical records.  History of hyperlipidemia, hypertension, PVCs, GERD.  He is on Crestor and Lopressor daily as well as Ambien as needed.  HPI     Past Medical History:  Diagnosis Date  . Allergic rhinitis   . Atypical chest pain 09/16/2015  . Dysrhythmia    a-fib/flutter  . Elevated blood pressure 10/19/2015  . GERD (gastroesophageal reflux disease)   . Hypertension   . Insomnia   . Left inguinal hernia   . Mixed hyperlipidemia   . Pure hypercholesterolemia 10/02/2018  . PVC (premature ventricular contraction) 10/19/2015  . Typical atrial flutter (HCC) 10/02/2018    Patient Active Problem List   Diagnosis Date Noted  . Chronic insomnia 09/15/2019  . Anxiety 08/02/2019  . Nocturia 08/02/2019  . GERD (gastroesophageal reflux disease) 04/16/2019  . Typical atrial flutter (HCC) 10/02/2018  . Pure hypercholesterolemia 10/02/2018  . PAF (paroxysmal atrial fibrillation) (HCC) 06/26/2017  . Anticoagulated 06/26/2017  . PVC (premature ventricular contraction) 10/19/2015  . Elevated blood pressure 10/19/2015  . Atypical chest pain 09/16/2015  . Hyperreflexia of lower extremity 02/09/2015  . RLS (restless legs syndrome) 02/09/2015  . PLMD (periodic  limb movement disorder) 02/09/2015    Past Surgical History:  Procedure Laterality Date  . ASD REPAIR  1994  . INGUINAL HERNIA REPAIR  1994  . INGUINAL HERNIA REPAIR Left 09/03/2018   Procedure: LEFT INGUINAL HERNIA REPAIR WITH MESH;  Surgeon: Manus Rudd, MD;  Location: [facility];  Service: General;  Laterality: Left;       Family History  Problem Relation Age of Onset  . Healthy Mother   . Heart attack Father   . Pulmonary embolism Brother     Social History   Tobacco Use  . Smoking status: Former Smoker    Packs/day: 0.50    Years: 9.00    Pack years: 4.50    Types: Cigarettes    Quit date: 01/22/2006    Years since quitting: 14.3  . Smokeless tobacco: Never Used  Vaping Use  . Vaping Use: Never used  Substance Use Topics  . Alcohol use: Yes    Comment: 10-12 drinks per week  . Drug use: No    Home Medications Prior to Admission medications   Medication Sig Start Date End Date Taking? Authorizing Provider  CALCIUM PO Take 1 tablet by mouth daily. 600 mg daily    [provider]  Cholecalciferol (VITAMIN D PO) Take 1 capsule by mouth daily.     [provider]  ELIQUIS 5 MG TABS tablet Take 1 tablet by mouth twice daily 03/29/20   Chilton Si, MD  levocetirizine (XYZAL) 5 MG tablet TAKE 1 TABLET BY MOUTH  ONCE DAILY IN THE EVENING 03/25/20   Dorothyann Peng, MD  MAGNESIUM PO Take by mouth.    [provider]  metoprolol tartrate (LOPRESSOR) 50 MG tablet Take 1 tablet by mouth twice daily 03/29/20   Chilton Si, MD  Multiple Vitamin (MULTIVITAMIN) capsule Take 1 capsule by mouth daily.    [provider]  Omega-3 Fatty Acids (FISH OIL PO) Take 1 capsule by mouth. EVERY OTHER DAY    [provider]  rosuvastatin (CRESTOR) 10 MG tablet Take 1 tablet by mouth once daily 10/08/19   Dorothyann Peng, MD  zolpidem (AMBIEN) 10 MG tablet Take 1 tablet (10 mg total) by mouth at bedtime as needed. 04/12/20   Arnette Felts, FNP    Allergies    Patient has no known allergies.  Review of Systems   Review of Systems  Constitutional: Negative.   HENT: Negative.   Respiratory: Negative.   Cardiovascular: Negative.   Gastrointestinal: Negative.   Genitourinary: Negative.   Musculoskeletal: Positive for gait problem and myalgias.       Left heal pain  Skin: Negative.     Physical Exam Updated Vital Signs BP 115/76 (BP Location: Left Arm)   Pulse 83   Temp 97.6 F (36.4 C) (Oral)   Resp 18   SpO2 99%   Physical Exam Vitals and nursing note reviewed.  HENT:     Head: Normocephalic and atraumatic.  Eyes:     General: No scleral icterus.       Right eye: No discharge.        Left eye: No discharge.     Conjunctiva/sclera: Conjunctivae normal.  Cardiovascular:     Rate and Rhythm: Normal rate.  Pulmonary:     Effort: Pulmonary effort is normal.  Musculoskeletal:     Right ankle: Normal.     Left ankle: Normal.     Right foot: Normal.     Left foot: Normal capillary refill. Tenderness present. No swelling, bony tenderness or crepitus. Normal pulse.       Feet:  Feet:     Right foot:     Skin integrity: Skin integrity normal.     Toenail Condition: Right toenails are long.     Left foot:     Skin integrity: Skin integrity normal.     Toenail Condition: Left toenails are long.     Comments: Pain with ambulation, the patient with very mild antalgic gait.  Normal cap refill in all 5 digits of the left foot, normal pedal pulse. Skin:    General: Skin is warm and dry.     Capillary Refill: Capillary refill takes less than 2 seconds.  Neurological:     General: No focal deficit present.     Mental Status: He is alert.     Sensory: Sensation is intact.     Motor: Motor function is intact.     Gait: Gait is intact.     Comments: Very mild limp with antalgic gait, patient ambulatory without assistance without difficulty.  Psychiatric:        Mood and Affect: Mood normal.     ED  Results / Procedures / Treatments   Labs (all labs ordered are listed, but only abnormal results are displayed) Labs Reviewed - No data to display  EKG None  Radiology DG Ankle Complete Left  Result Date: 06/02/2020 CLINICAL DATA:  Atraumatic left ankle pain EXAM: LEFT ANKLE COMPLETE - 3+ VIEW COMPARISON:  None. FINDINGS: There is no  evidence of fracture, dislocation, or joint effusion. Heel spurs. No joint narrowing or spurring. Subjective osteopenia. IMPRESSION: 1. No acute finding or joint narrowing. 2. Heel spurs. Electronically Signed   By: Marnee Spring M.D.   On: 06/02/2020 11:57    Procedures Procedures   Medications Ordered in ED Medications - No data to display  ED Course  I have reviewed the triage vital signs and the nursing notes.  Pertinent labs & imaging results that were available during my care of the patient were reviewed by me and considered in my medical decision making (see chart for details).    MDM Rules/Calculators/A&P                          75 year old male presents with concern for a left ankle pain x24 hours, worse with downward pressure to the joint.  Differential diagnosis includes but limited to gout, pseudogout, cellulitis, osteomyelitis, acute fracture dislocation, ligamentous injury, overuse injury, tendinitis, bursitis.  Vital signs are normal on intake.  Physical exam revealed very mild erythema or single point in the posterior/lateral left heel without crepitus, warmth to the touch, swelling, or skin changes.  There is no compromising of the skin integrity of the area.  Normal cap refill and pedal pulse on left foot.  Patient with mildly antalgic gait, though able to ambulate with out difficulty independently.  Plain film of the foot obtained in triage which revealed no acute fractures or dislocations but did reveal multiple heel spurs, without evidence of effusion. Patient is point tender over the largest spur visible on x-ray.  Suspect this  is the source of patient's symptoms.  No concern for cellulitis at this time given reassuring physical exam and lack of predisposition toward infection.  Doubt gout given mild tenderness palpation rather than exquisite pain, lack of swelling and redness or warmth to the touch.  No further work-up warranted in ED at this time.  Recommend patient take OTC analgesia as needed and follow-up with podiatry or orthopedics.  Will offer Ace wrap for support.  Takoda voiced understanding of his medical evaluation and treatment plan.  Each of his questions was answered to his expressed satisfaction.  Return precautions given.  Patient is well-appearing, stable, and appropriate for discharge at this time.  This chart was dictated using voice recognition software, Dragon. Despite the best efforts of this provider to proofread and correct errors, errors may still occur which can change documentation meaning.  Final Clinical Impression(s) / ED Diagnoses Final diagnoses:  Calcaneal spur of left foot    Rx / DC Orders ED Discharge Orders    None       Sherrilee Gilles 06/02/20 1734    Pollyann Savoy, MD 06/03/20 [phone]

## 2020-06-02 NOTE — ED Notes (Signed)
Pt ambulatory to xray at this time.

## 2020-06-02 NOTE — ED Notes (Signed)
Pt back from xray and ambulatory to the bathroom.

## 2020-06-02 NOTE — ED Triage Notes (Signed)
Pt presents with c/o left ankle pain that started yesterday, no injury reported by pt. Pt reports he has not been able to put much pressure on that ankle since the pain started.

## 2020-06-26 ENCOUNTER — Other Ambulatory Visit: Payer: Self-pay | Admitting: Cardiovascular Disease

## 2020-06-27 NOTE — Telephone Encounter (Signed)
51m, 74.8kg, scr0.96 07/30/19, lovw/[place] 10/14/19

## 2020-07-13 ENCOUNTER — Other Ambulatory Visit: Payer: Self-pay | Admitting: Nurse Practitioner

## 2020-08-04 ENCOUNTER — Encounter: Payer: Medicare PPO | Admitting: Internal Medicine

## 2020-08-08 ENCOUNTER — Other Ambulatory Visit: Payer: Self-pay | Admitting: Internal Medicine

## 2020-08-08 ENCOUNTER — Other Ambulatory Visit: Payer: Self-pay | Admitting: Cardiovascular Disease

## 2020-08-18 ENCOUNTER — Encounter: Payer: Self-pay | Admitting: Nurse Practitioner

## 2020-08-18 ENCOUNTER — Other Ambulatory Visit: Payer: Self-pay

## 2020-08-18 ENCOUNTER — Ambulatory Visit (INDEPENDENT_AMBULATORY_CARE_PROVIDER_SITE_OTHER): Payer: Medicare PPO | Admitting: Nurse Practitioner

## 2020-08-18 VITALS — BP 122/70 | HR 66 | Temp 97.8°F | Ht 68.6 in | Wt 160.8 lb

## 2020-08-18 DIAGNOSIS — R059 Cough, unspecified: Secondary | ICD-10-CM

## 2020-08-18 DIAGNOSIS — M546 Pain in thoracic spine: Secondary | ICD-10-CM | POA: Diagnosis not present

## 2020-08-18 LAB — POC COVID19 BINAXNOW: SARS Coronavirus 2 Ag: NEGATIVE

## 2020-08-18 MED ORDER — TRAMADOL HCL 50 MG PO TABS: 50.0000 mg | ORAL_TABLET | Freq: Four times a day (QID) | ORAL | 0 refills | Status: DC | PRN

## 2020-08-18 MED ORDER — BENZONATATE 100 MG PO CAPS: 100.0000 mg | ORAL_CAPSULE | Freq: Three times a day (TID) | ORAL | 1 refills | Status: DC | PRN

## 2020-08-18 MED ORDER — GUAIFENESIN-DM 100-10 MG/5ML PO SYRP: 5.0000 mL | ORAL_SOLUTION | ORAL | 0 refills | Status: DC | PRN

## 2020-08-18 NOTE — Progress Notes (Signed)
I,Tianna Badgett,acting as a Neurosurgeon for [employer], NP.,have documented all relevant documentation on the behalf of [employer], NP,as directed by  Charlesetta Ivory, NP while in the presence of Charlesetta Ivory, NP.  This visit occurred during the SARS-CoV-2 public health emergency.  Safety protocols were in place, including screening questions prior to the visit, additional usage of staff PPE, and extensive cleaning of exam room while observing appropriate contact time as indicated for disinfecting solutions.  Subjective:     Patient ID: Carlos [name] , male    DOB: [birth year] , 75 y.o.   MRN: [id number]   Chief Complaint  Patient presents with   Back Pain    HPI  Patient is here for back pain and cough.  He has a cough which started 8 weeks. No SOB and No chest pain.   Back Pain This is a new problem. The current episode started more than 1 month ago. The problem occurs constantly. The problem has been waxing and waning since onset. The pain is present in the thoracic spine. The quality of the pain is described as aching. The pain does not radiate. The pain is at a severity of 7/10. The pain is mild. The pain is Worse during the night. The symptoms are aggravated by lying down. Pertinent negatives include no chest pain or fever.    Past Medical History:  Diagnosis Date   Allergic rhinitis    Atypical chest pain 09/16/2015   Dysrhythmia    a-fib/flutter   Elevated blood pressure 10/19/2015   GERD (gastroesophageal reflux disease)    Hypertension    Insomnia    Left inguinal hernia    Mixed hyperlipidemia    Pure hypercholesterolemia 10/02/2018   PVC (premature ventricular contraction) 10/19/2015   Typical atrial flutter (HCC) 10/02/2018     Family History  Problem Relation Age of Onset   Healthy Mother    Heart attack Father    Pulmonary embolism Brother      Current Outpatient Medications:    benzonatate (TESSALON PERLES) 100 MG capsule, Take 1 capsule  (100 mg total) by mouth 3 (three) times daily as needed for cough., Disp: 30 capsule, Rfl: 1   guaiFENesin-dextromethorphan (ROBITUSSIN DM) 100-10 MG/5ML syrup, Take 5 mLs by mouth every 4 (four) hours as needed for cough., Disp: 118 mL, Rfl: 0   traMADol (ULTRAM) 50 MG tablet, Take 1 tablet (50 mg total) by mouth every 6 (six) hours as needed for up to 3 days., Disp: 12 tablet, Rfl: 0   CALCIUM PO, Take 1 tablet by mouth daily. 600 mg daily, Disp: , Rfl:    Cholecalciferol (VITAMIN D PO), Take 1 capsule by mouth daily. , Disp: , Rfl:    ELIQUIS 5 MG TABS tablet, Take 1 tablet by mouth twice daily, Disp: 180 tablet, Rfl: 1   levocetirizine (XYZAL) 5 MG tablet, TAKE 1 TABLET BY MOUTH ONCE DAILY IN THE EVENING, Disp: 90 tablet, Rfl: 0   MAGNESIUM PO, Take by mouth., Disp: , Rfl:    metoprolol tartrate (LOPRESSOR) 50 MG tablet, Take 1 tablet by mouth twice daily, Disp: 180 tablet, Rfl: 0   Multiple Vitamin (MULTIVITAMIN) capsule, Take 1 capsule by mouth daily., Disp: , Rfl:    Omega-3 Fatty Acids (FISH OIL PO), Take 1 capsule by mouth. EVERY OTHER DAY, Disp: , Rfl:    rosuvastatin (CRESTOR) 10 MG tablet, Take 1 tablet by mouth once daily, Disp: 90 tablet, Rfl: 0   zolpidem (AMBIEN) 10 MG tablet, TAKE  1 TABLET BY MOUTH AT BEDTIME AS NEEDED, Disp: 30 tablet, Rfl: 2   No Known Allergies   Review of Systems  Constitutional: Negative.  Negative for chills and fever.  HENT:  Negative for congestion, ear pain, rhinorrhea, sinus pressure, sinus pain and sore throat.   Respiratory:  Positive for cough. Negative for shortness of breath and wheezing.   Cardiovascular: Negative.  Negative for chest pain and palpitations.  Gastrointestinal: Negative.  Negative for constipation, diarrhea and nausea.  Genitourinary:  Negative for flank pain.  Musculoskeletal:  Positive for back pain. Negative for arthralgias and myalgias.  Neurological: Negative.     Today's Vitals   08/18/20 0853  BP: 122/70  Pulse: 66   Temp: 97.8 F (36.6 C)  TempSrc: Oral  Weight: 160 lb 12.8 oz (72.9 kg)  Height: 5' 8.6" (1.742 m)  PainSc: 6    Body mass index is 24.02 kg/m.  Wt Readings from Last 3 Encounters:  08/18/20 160 lb 12.8 oz (72.9 kg)  04/04/20 164 lb 12.8 oz (74.8 kg)  03/23/20 164 lb (74.4 kg)    Objective:  Physical Exam Constitutional:      Appearance: Normal appearance.  HENT:     Head: Normocephalic and atraumatic.     Right Ear: Tympanic membrane, ear canal and external ear normal.     Left Ear: Tympanic membrane, ear canal and external ear normal.     Nose: Nose normal. No congestion.     Mouth/Throat:     Mouth: Mucous membranes are moist.     Pharynx: Oropharynx is clear.  Cardiovascular:     Rate and Rhythm: Normal rate and regular rhythm.     Pulses: Normal pulses.     Heart sounds: Normal heart sounds. No murmur heard. Pulmonary:     Effort: Pulmonary effort is normal. No respiratory distress.     Breath sounds: Normal breath sounds. No wheezing.  Skin:    General: Skin is warm and dry.     Capillary Refill: Capillary refill takes less than 2 seconds.  Neurological:     Mental Status: He is alert.        Assessment And Plan:     1. Cough No other symptoms. Started x 8 weeks ago. At home Covid test was neg. No fever. No congestion.  - POC COVID-19 - Novel Coronavirus, NAA ([facility]) - guaiFENesin-dextromethorphan (ROBITUSSIN DM) 100-10 MG/5ML syrup; Take 5 mLs by mouth every 4 (four) hours as needed for cough.  Dispense: 118 mL; Refill: 0 - benzonatate (TESSALON PERLES) 100 MG capsule; Take 1 capsule (100 mg total) by mouth 3 (three) times daily as needed for cough.  Dispense: 30 capsule; Refill: 1  2. Acute left-sided thoracic back pain -Patient has been suffering with back pain for a long time. He has tried changing mattress and doing stretches. He has also seen a orthopedic specialist in the past. Advised patient to do physical therapy and use heating pad for relief.   -Will send him to pain clinic if pain continues and for further pain management if needed.  - POCT Urinalysis Dipstick (81002) - traMADol (ULTRAM) 50 MG tablet; Take 1 tablet (50 mg total) by mouth every 6 (six) hours as needed for up to 3 days.  Dispense: 12 tablet; Refill: 0   Follow up: if symptoms persist or do not get better.   The patient was encouraged to call or send a message through MyChart for any questions or concerns.   Side effects and  appropriate use of all the medication(s) were discussed with the patient today. Patient advised to use the medication(s) as directed by their healthcare provider. The patient was encouraged to read, review, and understand all associated package inserts and contact our office with any questions or concerns. The patient accepts the risks of the treatment plan and had an opportunity to ask questions.   Patient was given opportunity to ask questions. Patient verbalized understanding of the plan and was able to repeat key elements of the plan. All questions were answered to their satisfaction.  Raman Chaslyn Eisen, DNP   I, Raman Annai Heick have reviewed all documentation for this visit. The documentation on 08/18/20 for the exam, diagnosis, procedures, and orders are all accurate and complete.    IF YOU HAVE BEEN REFERRED TO A SPECIALIST, IT MAY TAKE 1-2 WEEKS TO SCHEDULE/PROCESS THE REFERRAL. IF YOU HAVE NOT HEARD FROM US/SPECIALIST IN TWO WEEKS, PLEASE GIVE [place] A CALL AT [phone].   THE PATIENT IS ENCOURAGED TO PRACTICE SOCIAL DISTANCING DUE TO THE COVID-19 PANDEMIC.

## 2020-08-18 NOTE — Patient Instructions (Signed)
Acute Back Pain, Adult Acute back pain is sudden and usually short-lived. It is often caused by an injury to the muscles and tissues in the back. The injury may result from: A muscle or ligament getting overstretched or torn (strained). Ligaments are tissues that connect bones to each other. Lifting something improperly can cause a back strain. Wear and tear (degeneration) of the spinal disks. Spinal disks are circular tissue that provide cushioning between the bones of the spine (vertebrae). Twisting motions, such as while playing sports or doing yard work. A hit to the back. Arthritis. You may have a physical exam, lab tests, and imaging tests to find the cause ofyour pain. Acute back pain usually goes away with rest and home care. Follow these instructions at home: Managing pain, stiffness, and swelling Treatment may include medicines for pain and inflammation that are taken by mouth or applied to the skin, prescription pain medicine, or muscle relaxants. Take over-the-counter and prescription medicines only as told by your health care provider. Your health care provider may recommend applying ice during the first 24-48 hours after your pain starts. To do this: Put ice in a plastic bag. Place a towel between your skin and the bag. Leave the ice on for 20 minutes, 2-3 times a day. If directed, apply heat to the affected area as often as told by your health care provider. Use the heat source that your health care provider recommends, such as a moist heat pack or a heating pad. Place a towel between your skin and the heat source. Leave the heat on for 20-30 minutes. Remove the heat if your skin turns bright red. This is especially important if you are unable to feel pain, heat, or cold. You have a greater risk of getting burned. Activity  Do not stay in bed. Staying in bed for more than 1-2 days can delay your recovery. Sit up and stand up straight. Avoid leaning forward when you sit or  hunching over when you stand. If you work at a desk, sit close to it so you do not need to lean over. Keep your chin tucked in. Keep your neck drawn back, and keep your elbows bent at a 90-degree angle (right angle). Sit high and close to the steering wheel when you drive. Add lower back (lumbar) support to your car seat, if needed. Take short walks on even surfaces as soon as you are able. Try to increase the length of time you walk each day. Do not sit, drive, or stand in one place for more than 30 minutes at a time. Sitting or standing for long periods of time can put stress on your back. Do not drive or use heavy machinery while taking prescription pain medicine. Use proper lifting techniques. When you bend and lift, use positions that put less stress on your back: Bend your knees. Keep the load close to your body. Avoid twisting. Exercise regularly as told by your health care provider. Exercising helps your back heal faster and helps prevent back injuries by keeping muscles strong and flexible. Work with a physical therapist to make a safe exercise program, as recommended by your health care provider. Do any exercises as told by your physical therapist.  Lifestyle Maintain a healthy weight. Extra weight puts stress on your back and makes it difficult to have good posture. Avoid activities or situations that make you feel anxious or stressed. Stress and anxiety increase muscle tension and can make back pain worse. Learn ways to manage   anxiety and stress, such as through exercise. General instructions Sleep on a firm mattress in a comfortable position. Try lying on your side with your knees slightly bent. If you lie on your back, put a pillow under your knees. Follow your treatment plan as told by your health care provider. This may include: Cognitive or behavioral therapy. Acupuncture or massage therapy. Meditation or yoga. Contact a health care provider if: You have pain that is not  relieved with rest or medicine. You have increasing pain going down into your legs or buttocks. Your pain does not improve after 2 weeks. You have pain at night. You lose weight without trying. You have a fever or chills. Get help right away if: You develop new bowel or bladder control problems. You have unusual weakness or numbness in your arms or legs. You develop nausea or vomiting. You develop abdominal pain. You feel faint. Summary Acute back pain is sudden and usually short-lived. Use proper lifting techniques. When you bend and lift, use positions that put less stress on your back. Take over-the-counter and prescription medicines and apply heat or ice as directed by your health care provider. This information is not intended to replace advice given to you by your health care provider. Make sure you discuss any questions you have with your healthcare provider. Document Revised: 09/29/2019 Document Reviewed: 10/02/2019 Elsevier Patient Education  2022 Elsevier Inc[employer]  

## 2020-08-19 LAB — NOVEL CORONAVIRUS, NAA: SARS-CoV-2, NAA: NOT DETECTED

## 2020-08-19 LAB — SARS-COV-2, NAA 2 DAY TAT

## 2020-08-24 ENCOUNTER — Other Ambulatory Visit: Payer: Self-pay | Admitting: Nurse Practitioner

## 2020-08-24 DIAGNOSIS — G8929 Other chronic pain: Secondary | ICD-10-CM

## 2020-08-26 ENCOUNTER — Other Ambulatory Visit: Payer: Self-pay | Admitting: Nurse Practitioner

## 2020-08-26 DIAGNOSIS — M546 Pain in thoracic spine: Secondary | ICD-10-CM

## 2020-08-30 ENCOUNTER — Other Ambulatory Visit: Payer: Self-pay | Admitting: Nurse Practitioner

## 2020-08-30 DIAGNOSIS — M546 Pain in thoracic spine: Secondary | ICD-10-CM

## 2020-08-30 MED ORDER — TRAMADOL HCL 50 MG PO TABS: 50.0000 mg | ORAL_TABLET | Freq: Four times a day (QID) | ORAL | 0 refills | Status: AC | PRN

## 2020-08-31 NOTE — Telephone Encounter (Signed)
Patient has a follow up tomorrow.  

## 2020-09-01 ENCOUNTER — Ambulatory Visit (INDEPENDENT_AMBULATORY_CARE_PROVIDER_SITE_OTHER): Payer: Medicare PPO | Admitting: Nurse Practitioner

## 2020-09-01 ENCOUNTER — Other Ambulatory Visit: Payer: Self-pay

## 2020-09-01 ENCOUNTER — Encounter: Payer: Self-pay | Admitting: Nurse Practitioner

## 2020-09-01 VITALS — BP 112/66 | HR 77 | Temp 97.5°F | Ht 69.8 in | Wt 160.8 lb

## 2020-09-01 DIAGNOSIS — I480 Paroxysmal atrial fibrillation: Secondary | ICD-10-CM | POA: Diagnosis not present

## 2020-09-01 DIAGNOSIS — F1010 Alcohol abuse, uncomplicated: Secondary | ICD-10-CM

## 2020-09-01 DIAGNOSIS — F5104 Psychophysiologic insomnia: Secondary | ICD-10-CM | POA: Diagnosis not present

## 2020-09-01 DIAGNOSIS — M545 Low back pain, unspecified: Secondary | ICD-10-CM | POA: Diagnosis not present

## 2020-09-01 DIAGNOSIS — Z79899 Other long term (current) drug therapy: Secondary | ICD-10-CM | POA: Diagnosis not present

## 2020-09-01 DIAGNOSIS — Z1159 Encounter for screening for other viral diseases: Secondary | ICD-10-CM | POA: Diagnosis not present

## 2020-09-01 DIAGNOSIS — Z0000 Encounter for general adult medical examination without abnormal findings: Secondary | ICD-10-CM

## 2020-09-01 DIAGNOSIS — G8929 Other chronic pain: Secondary | ICD-10-CM

## 2020-09-01 DIAGNOSIS — R351 Nocturia: Secondary | ICD-10-CM | POA: Diagnosis not present

## 2020-09-01 MED ORDER — ZOLPIDEM TARTRATE 10 MG PO TABS: 10.0000 mg | ORAL_TABLET | Freq: Every evening | ORAL | 2 refills | Status: DC | PRN

## 2020-09-01 MED ORDER — THIAMINE HCL 100 MG PO TABS: 100.0000 mg | ORAL_TABLET | Freq: Every day | ORAL | 0 refills | Status: DC

## 2020-09-01 NOTE — Progress Notes (Signed)
I,Katawbba Wiggins,acting as a Education administrator for [employer], NP.,have documented all relevant documentation on the behalf of [employer], NP,as directed by  Bary Castilla, NP while in the presence of Bary Castilla, NP.  This visit occurred during the SARS-CoV-2 public health emergency.  Safety protocols were in place, including screening questions prior to the visit, additional usage of staff PPE, and extensive cleaning of exam room while observing appropriate contact time as indicated for disinfecting solutions.  Subjective:     Patient ID: Carlos [name] , male    DOB: 10-20-1945 , 75 y.o.   MRN: [id number]   Chief Complaint  Patient presents with   Annual Exam    HPI  He is here today for a full physical examination.   Cardiologist he sees her on sept 26th for his A-fib. He drinks about 2-3 beers a night. He would like to discuss what he can do to help him stop drinking alcohol. He still has chronic pain in his back for which he was referred to the pain clinic. He is still deciding if he should go there or not. He enjoys working in the yard. He eats well.      Past Medical History:  Diagnosis Date   Allergic rhinitis    Atypical chest pain 09/16/2015   Dysrhythmia    a-fib/flutter   Elevated blood pressure 10/19/2015   GERD (gastroesophageal reflux disease)    Hypertension    Insomnia    Left inguinal hernia    Mixed hyperlipidemia    Pure hypercholesterolemia 10/02/2018   PVC (premature ventricular contraction) 10/19/2015   Typical atrial flutter ([facility]) 10/02/2018     Family History  Problem Relation Age of Onset   Healthy Mother    Heart attack Father    Pulmonary embolism Brother      Current Outpatient Medications:    benzonatate (TESSALON PERLES) 100 MG capsule, Take 1 capsule (100 mg total) by mouth 3 (three) times daily as needed for cough., Disp: 30 capsule, Rfl: 1   CALCIUM PO, Take 1 tablet by mouth daily. 600 mg daily, Disp: , Rfl:     Cholecalciferol (VITAMIN D PO), Take 1 capsule by mouth daily. , Disp: , Rfl:    ELIQUIS 5 MG TABS tablet, Take 1 tablet by mouth twice daily, Disp: 180 tablet, Rfl: 1   guaiFENesin-dextromethorphan (ROBITUSSIN DM) 100-10 MG/5ML syrup, Take 5 mLs by mouth every 4 (four) hours as needed for cough., Disp: 118 mL, Rfl: 0   levocetirizine (XYZAL) 5 MG tablet, TAKE 1 TABLET BY MOUTH ONCE DAILY IN THE EVENING, Disp: 90 tablet, Rfl: 0   MAGNESIUM PO, Take by mouth., Disp: , Rfl:    metoprolol tartrate (LOPRESSOR) 50 MG tablet, Take 1 tablet by mouth twice daily, Disp: 180 tablet, Rfl: 0   Multiple Vitamin (MULTIVITAMIN) capsule, Take 1 capsule by mouth daily., Disp: , Rfl:    Omega-3 Fatty Acids (FISH OIL PO), Take 1 capsule by mouth. EVERY OTHER DAY, Disp: , Rfl:    pantoprazole (PROTONIX) 40 MG tablet, Take 1 tablet by mouth once daily, Disp: 90 tablet, Rfl: 0   rosuvastatin (CRESTOR) 10 MG tablet, Take 1 tablet by mouth once daily, Disp: 90 tablet, Rfl: 0   thiamine 100 MG tablet, Take 1 tablet (100 mg total) by mouth daily., Disp: 30 tablet, Rfl: 0   traMADol (ULTRAM) 50 MG tablet, Take 1 tablet (50 mg total) by mouth every 6 (six) hours as needed for up to 3 days., Disp: 12  tablet, Rfl: 0   zolpidem (AMBIEN) 10 MG tablet, Take 1 tablet (10 mg total) by mouth at bedtime as needed., Disp: 30 tablet, Rfl: 2   No Known Allergies   Men's preventive visit. Patient Health Questionnaire (PHQ-2) is  Flowsheet Row Clinical Support from 03/23/2020 in [facility]  PHQ-2 Total Score 0     . Patient is on a  diet. Marital status: Divorced. Relevant history for alcohol use is:  Social History   Substance and Sexual Activity  Alcohol Use Yes   Comment: 10-12 drinks per week  . Relevant history for tobacco use is:  Social History   Tobacco Use  Smoking Status Former   Packs/day: 0.50   Years: 9.00   Pack years: 4.50   Types: Cigarettes   Quit date: 01/22/2006   Years since  quitting: 14.6  Smokeless Tobacco Never  .   Review of Systems  Constitutional: Negative.  Negative for chills and fatigue.  HENT: Negative.  Negative for congestion, rhinorrhea and sinus pain.   Eyes: Negative.   Respiratory: Negative.  Negative for cough, shortness of breath and wheezing.   Cardiovascular: Negative.  Negative for chest pain and palpitations.  Gastrointestinal: Negative.   Endocrine: Negative.   Genitourinary: Negative.   Musculoskeletal:  Positive for back pain.  Skin: Negative.   Allergic/Immunologic: Negative.   Neurological: Negative.  Negative for dizziness, weakness and headaches.  Hematological: Negative.   Psychiatric/Behavioral: Negative.    All other systems reviewed and are negative.   Today's Vitals   09/01/20 0945  BP: 112/66  Pulse: 77  Temp: (!) 97.5 F (36.4 C)  TempSrc: Oral  Weight: 160 lb 12.8 oz (72.9 kg)  Height: 5' 9.8" (1.773 m)  PainSc: 0-No pain   Body mass index is 23.2 kg/m.  Wt Readings from Last 3 Encounters:  09/01/20 160 lb 12.8 oz (72.9 kg)  08/18/20 160 lb 12.8 oz (72.9 kg)  04/04/20 164 lb 12.8 oz (74.8 kg)    BP Readings from Last 3 Encounters:  09/01/20 112/66  08/18/20 122/70  06/02/20 115/76    Objective:  Physical Exam Vitals and nursing note reviewed.  Constitutional:      Appearance: Normal appearance.  HENT:     Head: Normocephalic and atraumatic.     Right Ear: Tympanic membrane, ear canal and external ear normal.     Left Ear: Tympanic membrane, ear canal and external ear normal.     Nose: Nose normal.     Mouth/Throat:     Mouth: Mucous membranes are moist.     Pharynx: Oropharynx is clear.  Eyes:     Extraocular Movements: Extraocular movements intact.     Conjunctiva/sclera: Conjunctivae normal.     Pupils: Pupils are equal, round, and reactive to light.  Cardiovascular:     Rate and Rhythm: Normal rate and regular rhythm.     Pulses: Normal pulses.     Heart sounds: Normal heart sounds.  No murmur heard. Pulmonary:     Effort: Pulmonary effort is normal. No respiratory distress.     Breath sounds: Normal breath sounds. No wheezing.  Chest:  Breasts:    Right: Normal. No swelling, bleeding, inverted nipple, mass or nipple discharge.     Left: Normal. No swelling, bleeding, inverted nipple, mass or nipple discharge.  Abdominal:     General: Abdomen is flat. Bowel sounds are normal.     Palpations: Abdomen is soft.     Tenderness: There is no  abdominal tenderness.  Genitourinary:    Prostate: Normal.     Rectum: Normal. Guaiac result negative.  Musculoskeletal:        General: Normal range of motion.     Cervical back: Normal range of motion and neck supple.  Skin:    General: Skin is warm.     Capillary Refill: Capillary refill takes less than 2 seconds.  Neurological:     General: No focal deficit present.     Mental Status: He is alert.  Psychiatric:        Mood and Affect: Mood normal.        Behavior: Behavior normal.        Assessment And Plan:    1. Routine general medical examination at health care facility --Patient is here for their annual physical exam and we discussed any changes to medication and medical history.  -Behavior modification was discussed as well as diet and exercise history  -Patient will continue to exercise regularly and modify their diet.  -Recommendation for yearly physical annuals, immunization and screenings including mammogram and colonoscopy were discussed with the patient.  -Recommended intake of multivitamin, vitamin D and calcium.  -Individualized advise was given to the patient pertaining to their own health history in regards to diet, exercise, medical condition and referrals.  - CBC - Hepatitis C antibody - Hemoglobin A1c - CMP14+EGFR - PSA - Lipid panel  2. PAF (paroxysmal atrial fibrillation) (HCC) -Chronic, stable  - EKG 12-Lead -Followed by Dr. Roselee Nova. Has appt coming up with her.   3. Chronic  insomnia -Chronic, he was given refill of Ambien.  -He wanted to increase his dose of Ambien however he was advised that due to his age and drinking alcohol, I was unable to increase his dose.  - zolpidem (AMBIEN) 10 MG tablet; Take 1 tablet (10 mg total) by mouth at bedtime as needed.  Dispense: 30 tablet; Refill: 2  4. Alcohol use disorder, mild, abuse -He was advised about joining AA or [facility] To call at [phone] - thiamine 100 MG tablet; Take 1 tablet (100 mg total) by mouth daily.  Dispense: 30 tablet; Refill: 0  5. Other chronic pain -He has chronic pain in his back. He has been referred to the pain clinic for better pain management.  -Advised him to use proper body mechanics   The patient was encouraged to call or send a message through [place] for any questions or concerns.   Follow up: if symptoms persist or do not get better.   Side effects and appropriate use of all the medication(s) were discussed with the patient today. Patient advised to use the medication(s) as directed by their healthcare provider. The patient was encouraged to read, review, and understand all associated package inserts and contact our office with any questions or concerns. The patient accepts the risks of the treatment plan and had an opportunity to ask questions.   Staying healthy and adopting a healthy lifestyle for your overall health is important. You should eat 7 or more servings of fruits and vegetables per day. You should drink plenty of water to keep yourself hydrated and your kidneys healthy. This includes about 65-80+ fluid ounces of water. Limit your intake of animal fats especially for elevated cholesterol. Avoid highly processed food and limit your salt intake if you have hypertension. Avoid foods high in saturated/Trans fats. Along with a healthy diet it is also very important to maintain time for yourself to maintain a healthy  mental health with low stress levels. You  should get atleast 150 min of moderate intensity exercise weekly for a healthy heart. Along with eating right and exercising, aim for at least 7-9 hours of sleep daily.  Eat more whole grains which includes barley, wheat berries, oats, brown rice and whole wheat pasta. Use healthy plant oils which include olive, soy, corn, sunflower and peanut. Limit your caffeine and sugary drinks. Limit your intake of fast foods. Limit milk and dairy products to one or two daily servings.   Patient was given opportunity to ask questions. Patient verbalized understanding of the plan and was able to repeat key elements of the plan. All questions were answered to their satisfaction.  Raman Perl Kerney, DNP   I, Raman Junell Cullifer have reviewed all documentation for this visit. The documentation on 09/01/20 for the exam, diagnosis, procedures, and orders are all accurate and complete.     THE PATIENT IS ENCOURAGED TO PRACTICE SOCIAL DISTANCING DUE TO THE COVID-19 PANDEMIC.

## 2020-09-01 NOTE — Patient Instructions (Signed)

## 2020-09-02 LAB — CBC
Hematocrit: 49.8 % (ref 37.5–51.0)
Hemoglobin: 16.2 g/dL (ref 13.0–17.7)
MCH: 28.7 pg (ref 26.6–33.0)
MCHC: 32.5 g/dL (ref 31.5–35.7)
MCV: 88 fL (ref 79–97)
Platelets: 312 10*3/uL (ref 150–450)
RBC: 5.64 x10E6/uL (ref 4.14–5.80)
RDW: 12.8 % (ref 11.6–15.4)
WBC: 6.6 10*3/uL (ref 3.4–10.8)

## 2020-09-02 LAB — CMP14+EGFR
ALT: 14 IU/L (ref 0–44)
AST: 18 IU/L (ref 0–40)
Albumin/Globulin Ratio: 2 (ref 1.2–2.2)
Albumin: 4.8 g/dL — ABNORMAL HIGH (ref 3.7–4.7)
Alkaline Phosphatase: 93 IU/L (ref 44–121)
BUN/Creatinine Ratio: 20 (ref 10–24)
BUN: 18 mg/dL (ref 8–27)
Bilirubin Total: 0.8 mg/dL (ref 0.0–1.2)
CO2: 24 mmol/L (ref 20–29)
Calcium: 9.9 mg/dL (ref 8.6–10.2)
Chloride: 104 mmol/L (ref 96–106)
Creatinine, Ser: 0.9 mg/dL (ref 0.76–1.27)
Globulin, Total: 2.4 g/dL (ref 1.5–4.5)
Glucose: 113 mg/dL — ABNORMAL HIGH (ref 65–99)
Potassium: 4.9 mmol/L (ref 3.5–5.2)
Sodium: 142 mmol/L (ref 134–144)
Total Protein: 7.2 g/dL (ref 6.0–8.5)
eGFR: 90 mL/min/{1.73_m2} (ref >59)

## 2020-09-02 LAB — LIPID PANEL
Chol/HDL Ratio: 3 ratio (ref 0.0–5.0)
Cholesterol, Total: 134 mg/dL (ref 100–199)
HDL: 45 mg/dL (ref >39)
LDL Chol Calc (NIH): 71 mg/dL (ref 0–99)
Triglycerides: 98 mg/dL (ref 0–149)
VLDL Cholesterol Cal: 18 mg/dL (ref 5–40)

## 2020-09-02 LAB — PSA: Prostate Specific Ag, Serum: 0.5 ng/mL (ref 0.0–4.0)

## 2020-09-02 LAB — HEPATITIS C ANTIBODY: Hep C Virus Ab: 0.1 s/co ratio (ref 0.0–0.9)

## 2020-09-02 LAB — HEMOGLOBIN A1C
Est. average glucose Bld gHb Est-mCnc: 128 mg/dL
Hgb A1c MFr Bld: 6.1 % — ABNORMAL HIGH (ref 4.8–5.6)

## 2020-10-04 ENCOUNTER — Other Ambulatory Visit: Payer: Self-pay | Admitting: Nurse Practitioner

## 2020-10-04 DIAGNOSIS — Z79899 Other long term (current) drug therapy: Secondary | ICD-10-CM

## 2020-10-04 DIAGNOSIS — R351 Nocturia: Secondary | ICD-10-CM

## 2020-10-08 ENCOUNTER — Other Ambulatory Visit: Payer: Self-pay | Admitting: Cardiovascular Disease

## 2020-10-10 NOTE — Telephone Encounter (Signed)
Rx(s) sent to pharmacy electronically.  

## 2020-10-17 ENCOUNTER — Other Ambulatory Visit: Payer: Self-pay

## 2020-10-17 ENCOUNTER — Encounter (HOSPITAL_BASED_OUTPATIENT_CLINIC_OR_DEPARTMENT_OTHER): Payer: Self-pay | Admitting: Cardiovascular Disease

## 2020-10-17 ENCOUNTER — Ambulatory Visit (INDEPENDENT_AMBULATORY_CARE_PROVIDER_SITE_OTHER): Payer: Medicare PPO | Admitting: Cardiovascular Disease

## 2020-10-17 VITALS — BP 110/78 | HR 84 | Ht 70.5 in | Wt 166.0 lb

## 2020-10-17 DIAGNOSIS — K219 Gastro-esophageal reflux disease without esophagitis: Secondary | ICD-10-CM

## 2020-10-17 DIAGNOSIS — E7800 Pure hypercholesterolemia, unspecified: Secondary | ICD-10-CM | POA: Diagnosis not present

## 2020-10-17 DIAGNOSIS — I483 Typical atrial flutter: Secondary | ICD-10-CM

## 2020-10-17 NOTE — Progress Notes (Signed)
Cardiology Office Note   Date:  10/17/2020   ID:  Carlos [name], DOB 1945/03/22, MRN [id number]  PCP:  Dorothyann Peng, MD  Cardiologist:   Chilton Si, MD   No chief complaint on file.    History of Present Illness: Carlos [name] is a 75 y.o. male with persistentl atrial fibrillation, hyperlipidemia, ASD s/p surgical repair, and mild ascending aorta (3.6 cm) aneurysm who presents for follow up.  He was initially seen 08/2015 due to L sided chest pain.  He was referred for ETT, which was negative for ischemia.  He was noted to have several PVCs but was asymptomatic.  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 Allyne Gee on 05/22/17 due to dysphagia.  While there his heart rate was 130 bpm and he was in atrial fibrillation.  He was started on metoprolol and referred to cardiology.  Dr. Sherryll Burger had an echo 05/2017 that revealed LVEF 60-65%.  He was also started on Eliquis.     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  He had L inguinal hernia repair 07/2018 with Dr. Corliss Skains that went well.  He had some atypical chest pain that was thought to be due to GERD and he was started on a PPI.  Symptoms have improved since making this change.    Today, he has been alright overall. Occasionally he feels anxious at times. He continues to have some difficulty sleeping, but notes improvement with taking Ambien. Sometimes he feels LE pain in his feet, which usually resolves in 1-2 days with ibuprofen. For exercise he continues to walk regularly with no issues. Every now and then he feels a fluttering sensation in his eyes. He denies any palpitations, chest pain, or shortness of breath. No lightheadedness, headaches, syncope, orthopnea, or PND. Also has no lower extremity edema.  Past Medical History:  Diagnosis Date   Allergic rhinitis    Atypical chest pain 09/16/2015   Dysrhythmia    a-fib/flutter   Elevated blood pressure 10/19/2015   GERD (gastroesophageal reflux disease)    Hypertension     Insomnia    Left inguinal hernia    Mixed hyperlipidemia    Pure hypercholesterolemia 10/02/2018   PVC (premature ventricular contraction) 10/19/2015   Typical atrial flutter (HCC) 10/02/2018    Past Surgical History:  Procedure Laterality Date   ASD REPAIR  1994   INGUINAL HERNIA REPAIR  1994   INGUINAL HERNIA REPAIR Left 09/03/2018   Procedure: LEFT INGUINAL HERNIA REPAIR WITH MESH;  Surgeon: Manus Rudd, MD;  Location: [facility];  Service: General;  Laterality: Left;     Current Outpatient Medications  Medication Sig Dispense Refill   CALCIUM PO Take 1 tablet by mouth daily. 600 mg daily     Cholecalciferol (VITAMIN D PO) Take 1 capsule by mouth daily.      ELIQUIS 5 MG TABS tablet Take 1 tablet by mouth twice daily 180 tablet 1   MAGNESIUM PO Take by mouth.     metoprolol tartrate (LOPRESSOR) 50 MG tablet Take 1 tablet by mouth twice daily 180 tablet 0   Multiple Vitamin (MULTIVITAMIN) capsule Take 1 capsule by mouth daily.     Omega-3 Fatty Acids (FISH OIL PO) Take 1 capsule by mouth. EVERY OTHER DAY     pantoprazole (PROTONIX) 40 MG tablet Take 1 tablet by mouth once daily 90 tablet 0   rosuvastatin (CRESTOR) 10 MG tablet Take 1 tablet by mouth once daily 90 tablet 0   zolpidem (AMBIEN)  10 MG tablet Take 1 tablet (10 mg total) by mouth at bedtime as needed. 30 tablet 2   No current facility-administered medications for this visit.    Allergies:   Patient has no known allergies.    Social History:  The patient  reports that he quit smoking about 14 years ago. His smoking use included cigarettes. He has a 4.50 pack-year smoking history. He has never used smokeless tobacco. He reports current alcohol use. He reports that he does not use drugs.   Family History:  The patient's family history includes Healthy in his mother; Heart attack in his father; Pulmonary embolism in his brother.    ROS:   Please see the history of present illness. (+) Anxiety (+)  Insomnia (+) Bilateral LE pain, feet  (+) Fluttering sensations in his bilateral eyes All other systems are reviewed and negative.    PHYSICAL EXAM: VS:  BP 110/78   Pulse 84   Ht 5' 10.5" (1.791 m)   Wt 166 lb (75.3 kg)   SpO2 95%   BMI 23.48 kg/m  , BMI Body mass index is 23.48 kg/m. GENERAL:  Well appearing HEENT: Pupils equal round and reactive, fundi not visualized, oral mucosa unremarkable NECK:  No jugular venous distention, waveform within normal limits, carotid upstroke brisk and symmetric, no bruits LUNGS:  Clear to auscultation bilaterally HEART:  Irregularly irregular.  PMI not displaced or sustained,S1 and S2 within normal limits, no S3, no S4, no clicks, no rubs, no murmurs ABD:  Flat, positive bowel sounds normal in frequency in pitch, no bruits, no rebound, no guarding, no midline pulsatile mass, no hepatomegaly, no splenomegaly EXT:  2 plus pulses throughout, no edema, no cyanosis no clubbing SKIN:  No rashes no nodules NEURO:  Cranial nerves II through XII grossly intact, motor grossly intact throughout PSYCH:  Cognitively intact, oriented to person place and time   EKG:   10/17/2020: Atrial flutter. Rate 84 bpm. 10/14/19: Atrial flutter.  Rate 88 bpm. 04/16/2019: Atrial flutter.  Rate 87 bpm.  PVC. 10/02/2018: Atrial flutter.  Rate 86 bpm. 05/22/17: Atrial flutter.  Ventricular rate 130 bpm.   09/16/15: sinus rhythm rate 66 bpm.   Echo 06/07/2017: Study Conclusions   - Left ventricle: The cavity size was normal. Systolic function was   normal. The estimated ejection fraction was in the range of 60%   to 65%. Wall motion was normal; there were no regional wall   motion abnormalities. - Ventricular septum: Septal motion showed paradox. - Mitral valve: There was mild to moderate regurgitation directed   centrally. - Left atrium: The atrium was mildly dilated. - Right ventricle: The cavity size was moderately dilated. Wall   thickness was normal. - Right  atrium: The atrium was mildly dilated. - Atrial septum: No defect or patent foramen ovale was identified. - Pulmonary arteries: PA peak pressure: 32 mm Hg (S).   Impressions:   - Atrial flutter with variable block and mild RVR during the entire   study.  ETT 10/07/15: Blood pressure demonstrated a normal response to exercise. Upsloping ST segment depression ST segment depression was noted during stress in the V4, V5, V6, II, III and aVF leads. No T wave inversion was noted during stress. Negative, adequate stress test.  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  Rates are well-controlled on metoprolol.  He is doing well on Eliquis.  He has no heart failure symptoms.  However given that it has been several years and he has been consistently in atrial flutter, we will get an echocardiogram to make sure he is not having any evidence of heart failure.  GERD (gastroesophageal reflux disease) Symptoms are much better controlled on pantoprazole.  He has very rare flares.  Pure hypercholesterolemia Lipids are excellent.  Continue rosuvastatin and regular exercise routine.  # Persistent atrial flutter: Mr. Thielen is remains in atrial flutter.  The rates are well-controlled and he is completely asymptomatic.  Continue metoprolol and Eliquis.  # Atypical chest pain:   Resolved.   ETT negative for ischemia.  # GERD: Chest pain has resolved and symptoms improved on pantoprazole.  # Hyperlipidemia:   Lipids were well controlled on rosuvastatin but were elevated at his last visit.  He reports that he is taking it.  He is going to work on limiting his sweets and repeat his lipids when he sees Dr. Allyne Gee in December.  If his LDL remains over 100, would recommend increasing the rosuvastatin to 20 mg.    Current medicines are reviewed at length with the patient today.  The patient does not have concerns regarding medicines.  The following changes have been made:  no change  Labs/ tests ordered today include:   Orders Placed This Encounter  Procedures   EKG 12-Lead   ECHOCARDIOGRAM COMPLETE    Disposition:   FU with Celestia Duva C. Duke Salvia, MD, [facility] in 1 year.   I,Mathew Stumpf,acting as a Neurosurgeon for Chilton Si, MD.,have documented all relevant documentation on the behalf of Chilton Si, MD,as directed by  Chilton Si, MD while in the presence of Chilton Si, MD.  I, Darianny Momon C. Duke Salvia, MD have reviewed all documentation for this visit.  The documentation of the exam, diagnosis, procedures, and orders on 10/17/2020 are all accurate and complete.   Signed, Isreal Moline C. Duke Salvia, MD, [facility]  10/17/2020 5:36 PM    [facility]

## 2020-10-17 NOTE — Assessment & Plan Note (Signed)
Lipids are excellent.  Continue rosuvastatin and regular exercise routine.

## 2020-10-17 NOTE — Assessment & Plan Note (Signed)
Symptoms are much better controlled on pantoprazole.  He has very rare flares.

## 2020-10-17 NOTE — Patient Instructions (Signed)
Medication Instructions:  Your physician recommends that you continue on your current medications as directed. Please refer to the Current Medication list given to you today.   *If you need a refill on your cardiac medications before your next appointment, please call your pharmacy*  Lab Work: NONE  Testing/Procedures: Your physician has requested that you have an echocardiogram. Echocardiography is a painless test that uses sound waves to create images of your heart. It provides your doctor with information about the size and shape of your heart and how well your heart's chambers and valves are working. This procedure takes approximately one hour. There are no restrictions for this procedure.   Follow-Up: At [facility], you and your health needs are our priority.  As part of our continuing mission to provide you with exceptional heart care, we have created designated Provider Care Teams.  These Care Teams include your primary Cardiologist (physician) and Advanced Practice Providers (APPs -  Physician Assistants and Nurse Practitioners) who all work together to provide you with the care you need, when you need it.  We recommend signing up for the patient portal called "MyChart".  Sign up information is provided on this After Visit Summary.  MyChart is used to connect with patients for Virtual Visits (Telemedicine).  Patients are able to view lab/test results, encounter notes, upcoming appointments, etc.  Non-urgent messages can be sent to your provider as well.   To learn more about what you can do with MyChart, go to [email or web address].    Your next appointment:   12 month(s)  The format for your next appointment:   In Person  Provider:   Tiffany Richey, MD         

## 2020-10-17 NOTE — Assessment & Plan Note (Signed)
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  Rates are well-controlled on metoprolol.  He is doing well on Eliquis.  He has no heart failure symptoms.  However given that it has been several years and he has been consistently in atrial flutter, we will get an echocardiogram to make sure he is not having any evidence of heart failure.

## 2020-10-27 ENCOUNTER — Ambulatory Visit (INDEPENDENT_AMBULATORY_CARE_PROVIDER_SITE_OTHER): Payer: Medicare PPO

## 2020-10-27 ENCOUNTER — Other Ambulatory Visit: Payer: Self-pay

## 2020-10-27 DIAGNOSIS — I483 Typical atrial flutter: Secondary | ICD-10-CM

## 2020-10-28 LAB — ECHOCARDIOGRAM COMPLETE
Area-P 1/2: 3.12 cm2
MV M vel: 3.9 m/s
MV Peak grad: 60.8 mmHg
MV VTI: 2.51 cm2
S' Lateral: 3.03 cm

## 2020-10-29 ENCOUNTER — Encounter (HOSPITAL_BASED_OUTPATIENT_CLINIC_OR_DEPARTMENT_OTHER): Payer: Self-pay

## 2020-11-07 ENCOUNTER — Telehealth: Payer: Self-pay | Admitting: Nurse Practitioner

## 2020-11-07 NOTE — Telephone Encounter (Signed)
November 07, 2020 Chilton Si, MD to Carlos Aland, NP  Me  Me   5:25 PM We have had this conversation many times.  He has no symptoms, is well rate-controlled and no heart failure.  I think he may want to see EP to get a second opinion. I don't have anything new to offer.   Tiffany C. Duke Salvia, MD, [facility]   Spoke with patient and he does feel good with plan Dr Duke Salvia has currently, no second opinion at this time

## 2020-11-07 NOTE — Telephone Encounter (Signed)
Patient is calling to follow up in regards to this due to not hearing back about his Echo results yet. Best callback for this is [phone].

## 2020-11-07 NOTE — Telephone Encounter (Signed)
Reviewed echo results with patient and questions were answered to his satisfaction. He proceeded with many questions about why he was not offered treatments for atrial flutter. States he has a cousin who is an internal medicine doctor and his cousin was telling him about different options. I advised that often treatment is based on his symptoms and preference. Advised I will forward message to Dr. Duke Salvia and her primary nurse, Juliette Alcide for follow-up. Patient verbalized understanding and agreement and thanked me for the call.

## 2020-11-10 ENCOUNTER — Other Ambulatory Visit: Payer: Self-pay | Admitting: Cardiovascular Disease

## 2020-11-10 NOTE — Telephone Encounter (Signed)
Rx request sent to pharmacy.  

## 2020-11-19 ENCOUNTER — Other Ambulatory Visit: Payer: Self-pay | Admitting: Internal Medicine

## 2020-11-29 ENCOUNTER — Other Ambulatory Visit: Payer: Self-pay | Admitting: Nurse Practitioner

## 2020-11-29 DIAGNOSIS — R059 Cough, unspecified: Secondary | ICD-10-CM

## 2020-11-29 MED ORDER — BENZONATATE 100 MG PO CAPS: 100.0000 mg | ORAL_CAPSULE | Freq: Three times a day (TID) | ORAL | 0 refills | Status: AC | PRN

## 2020-11-29 MED ORDER — GUAIFENESIN-DM 100-10 MG/5ML PO SYRP: 5.0000 mL | ORAL_SOLUTION | ORAL | 0 refills | Status: DC | PRN

## 2020-12-06 ENCOUNTER — Other Ambulatory Visit: Payer: Self-pay

## 2020-12-06 ENCOUNTER — Encounter: Payer: Self-pay | Admitting: Nurse Practitioner

## 2020-12-06 ENCOUNTER — Ambulatory Visit (INDEPENDENT_AMBULATORY_CARE_PROVIDER_SITE_OTHER): Payer: Medicare PPO | Admitting: Nurse Practitioner

## 2020-12-06 VITALS — BP 114/72 | HR 52 | Temp 98.1°F | Ht 70.0 in

## 2020-12-06 DIAGNOSIS — J209 Acute bronchitis, unspecified: Secondary | ICD-10-CM | POA: Diagnosis not present

## 2020-12-06 MED ORDER — AMOXICILLIN-POT CLAVULANATE 875-125 MG PO TABS: 1.0000 | ORAL_TABLET | Freq: Two times a day (BID) | ORAL | 0 refills | Status: AC

## 2020-12-06 NOTE — Patient Instructions (Signed)

## 2020-12-06 NOTE — Progress Notes (Signed)
I,Katawbba Wiggins,acting as a Education administrator for [employer], NP.,have documented all relevant documentation on the behalf of [employer], NP,as directed by  Bary Castilla, NP while in the presence of Bary Castilla, NP.  This visit occurred during the SARS-CoV-2 public health emergency.  Safety protocols were in place, including screening questions prior to the visit, additional usage of staff PPE, and extensive cleaning of exam room while observing appropriate contact time as indicated for disinfecting solutions.  Subjective:     Patient ID: Carlos [name] , male    DOB: 1945/02/25 , 75 y.o.   MRN: [id number]   Chief Complaint  Patient presents with   Cough    HPI  He is here for a outside visit for cough. He denies any shortness of breath, fever, chest pain or congestion. He has been having the cough for about 2-3 weeks now. He doesn't think it is covid or the flu. He has tried OTC cough syrup but nothing has helped.     Past Medical History:  Diagnosis Date   Allergic rhinitis    Atypical chest pain 09/16/2015   Dysrhythmia    a-fib/flutter   Elevated blood pressure 10/19/2015   GERD (gastroesophageal reflux disease)    Hypertension    Insomnia    Left inguinal hernia    Mixed hyperlipidemia    Pure hypercholesterolemia 10/02/2018   PVC (premature ventricular contraction) 10/19/2015   Typical atrial flutter ([place]) 10/02/2018     Family History  Problem Relation Age of Onset   Healthy Mother    Heart attack Father    Pulmonary embolism Brother      Current Outpatient Medications:    amoxicillin-clavulanate (AUGMENTIN) 875-125 MG tablet, Take 1 tablet by mouth 2 (two) times daily for 7 days., Disp: 14 tablet, Rfl: 0   benzonatate (TESSALON PERLES) 100 MG capsule, Take 1 capsule (100 mg total) by mouth 3 (three) times daily as needed for cough., Disp: 30 capsule, Rfl: 0   CALCIUM PO, Take 1 tablet by mouth daily. 600 mg daily, Disp: , Rfl:    Cholecalciferol  (VITAMIN D PO), Take 1 capsule by mouth daily. , Disp: , Rfl:    ELIQUIS 5 MG TABS tablet, Take 1 tablet by mouth twice daily, Disp: 180 tablet, Rfl: 1   guaiFENesin-dextromethorphan (ROBITUSSIN DM) 100-10 MG/5ML syrup, Take 5 mLs by mouth every 4 (four) hours as needed for cough., Disp: 118 mL, Rfl: 0   MAGNESIUM PO, Take by mouth., Disp: , Rfl:    metoprolol tartrate (LOPRESSOR) 50 MG tablet, Take 1 tablet by mouth twice daily, Disp: 180 tablet, Rfl: 0   Multiple Vitamin (MULTIVITAMIN) capsule, Take 1 capsule by mouth daily., Disp: , Rfl:    Omega-3 Fatty Acids (FISH OIL PO), Take 1 capsule by mouth. EVERY OTHER DAY, Disp: , Rfl:    pantoprazole (PROTONIX) 40 MG tablet, Take 1 tablet (40 mg total) by mouth daily., Disp: 90 tablet, Rfl: 1   rosuvastatin (CRESTOR) 10 MG tablet, Take 1 tablet by mouth once daily, Disp: 90 tablet, Rfl: 0   zolpidem (AMBIEN) 10 MG tablet, Take 1 tablet (10 mg total) by mouth at bedtime as needed., Disp: 30 tablet, Rfl: 2   No Known Allergies   Review of Systems  Constitutional:  Negative for chills, fatigue and fever.  HENT:  Negative for congestion, sinus pressure and sinus pain.   Respiratory:  Positive for cough. Negative for chest tightness, shortness of breath and wheezing.   Cardiovascular:  Negative for  chest pain and palpitations.  Gastrointestinal:  Negative for constipation and diarrhea.  Neurological:  Negative for headaches.    Today's Vitals   12/06/20 1456  BP: 114/72  Pulse: (!) 52  Temp: 98.1 F (36.7 C)  SpO2: 96%  Height: 5\' 10"  (1.778 m)   Body mass index is 23.82 kg/m.  Wt Readings from Last 3 Encounters:  10/17/20 166 lb (75.3 kg)  09/01/20 160 lb 12.8 oz (72.9 kg)  08/18/20 160 lb 12.8 oz (72.9 kg)    BP Readings from Last 3 Encounters:  12/06/20 114/72  10/17/20 110/78  09/01/20 112/66    Objective:  Physical Exam Constitutional:      Appearance: Normal appearance.  HENT:     Head: Normocephalic and atraumatic.   Cardiovascular:     Rate and Rhythm: Normal rate and regular rhythm.     Pulses: Normal pulses.     Heart sounds: Normal heart sounds. No murmur heard. Pulmonary:     Effort: Pulmonary effort is normal. No respiratory distress.     Breath sounds: Normal breath sounds. No wheezing.  Skin:    General: Skin is warm and dry.     Capillary Refill: Capillary refill takes less than 2 seconds.  Neurological:     Mental Status: He is alert.        Assessment And Plan:     1. Acute bronchitis, unspecified organism - amoxicillin-clavulanate (AUGMENTIN) 875-125 MG tablet; Take 1 tablet by mouth 2 (two) times daily for 7 days.  Dispense: 14 tablet; Refill: 0  -He has been having cough for 2-3 weeks ago. -Denies SOB or chest pain. No fever, congestion.  -Advised patient to continue to use cough syrup as needed.   The patient was encouraged to call or send a message through MyChart for any questions or concerns.   Follow up: if symptoms persist or do not get better.   Side effects and appropriate use of all the medication(s) were discussed with the patient today. Patient advised to use the medication(s) as directed by their healthcare provider. The patient was encouraged to read, review, and understand all associated package inserts and contact our office with any questions or concerns. The patient accepts the risks of the treatment plan and had an opportunity to ask questions.   Patient was given opportunity to ask questions. Patient verbalized understanding of the plan and was able to repeat key elements of the plan. All questions were answered to their satisfaction.  Raman Claude Swendsen, DNP   I, Raman Dione Mccombie have reviewed all documentation for this visit. The documentation on 12/06/20 for the exam, diagnosis, procedures, and orders are all accurate and complete.    IF YOU HAVE BEEN REFERRED TO A SPECIALIST, IT MAY TAKE 1-2 WEEKS TO SCHEDULE/PROCESS THE REFERRAL. IF YOU HAVE NOT HEARD FROM  US/SPECIALIST IN TWO WEEKS, PLEASE GIVE 12/08/20 A CALL AT [phone].   THE PATIENT IS ENCOURAGED TO PRACTICE SOCIAL DISTANCING DUE TO THE COVID-19 PANDEMIC.

## 2020-12-24 ENCOUNTER — Other Ambulatory Visit: Payer: Self-pay | Admitting: Cardiovascular Disease

## 2020-12-26 NOTE — Telephone Encounter (Signed)
Prescription refill request for Eliquis received. Indication:Afib Last office visit:9/22 Scr:0.9 Age: 75 Weight:75.3 kg  Prescription refilled

## 2020-12-26 NOTE — Telephone Encounter (Signed)
FYI

## 2020-12-29 NOTE — Telephone Encounter (Signed)
Patient aware of results.

## 2021-01-10 ENCOUNTER — Other Ambulatory Visit: Payer: Self-pay | Admitting: Nurse Practitioner

## 2021-01-10 DIAGNOSIS — F5104 Psychophysiologic insomnia: Secondary | ICD-10-CM

## 2021-02-13 ENCOUNTER — Other Ambulatory Visit: Payer: Self-pay | Admitting: Nurse Practitioner

## 2021-02-13 DIAGNOSIS — F5104 Psychophysiologic insomnia: Secondary | ICD-10-CM

## 2021-02-14 ENCOUNTER — Other Ambulatory Visit: Payer: Self-pay | Admitting: Internal Medicine

## 2021-02-14 DIAGNOSIS — F5104 Psychophysiologic insomnia: Secondary | ICD-10-CM

## 2021-02-14 MED ORDER — ZOLPIDEM TARTRATE 10 MG PO TABS: 10.0000 mg | ORAL_TABLET | Freq: Every evening | ORAL | 1 refills | Status: DC | PRN

## 2021-02-15 ENCOUNTER — Encounter: Payer: Self-pay | Admitting: Internal Medicine

## 2021-02-15 ENCOUNTER — Other Ambulatory Visit: Payer: Self-pay | Admitting: Internal Medicine

## 2021-02-15 DIAGNOSIS — F5104 Psychophysiologic insomnia: Secondary | ICD-10-CM

## 2021-02-15 MED ORDER — ZOLPIDEM TARTRATE 10 MG PO TABS: 10.0000 mg | ORAL_TABLET | Freq: Every evening | ORAL | 1 refills | Status: DC | PRN

## 2021-03-24 ENCOUNTER — Other Ambulatory Visit: Payer: Self-pay | Admitting: Cardiovascular Disease

## 2021-04-06 ENCOUNTER — Ambulatory Visit (INDEPENDENT_AMBULATORY_CARE_PROVIDER_SITE_OTHER): Payer: Medicare PPO

## 2021-04-06 ENCOUNTER — Ambulatory Visit: Payer: Medicare PPO | Admitting: Internal Medicine

## 2021-04-06 VITALS — Ht 70.0 in | Wt 163.0 lb

## 2021-04-06 DIAGNOSIS — Z0000 Encounter for general adult medical examination without abnormal findings: Secondary | ICD-10-CM | POA: Diagnosis not present

## 2021-04-06 NOTE — Patient Instructions (Signed)
Carlos [name] , ?Thank you for taking time to come for your Medicare Wellness Visit. I appreciate your ongoing commitment to your health goals. Please review the following plan we discussed and let me know if I can assist you in the future.  ? ?Screening recommendations/referrals: ?Colonoscopy: not required ?Recommended yearly ophthalmology/optometry visit for glaucoma screening and checkup ?Recommended yearly dental visit for hygiene and checkup ? ?Vaccinations: ?Influenza vaccine: decline ?Pneumococcal vaccine: decline ?Tdap vaccine: completed 02/01/2018, due 02/02/2028 ?Shingles vaccine: decline   ?Covid-19:  11/01/2020, 04/29/2020, 01/27/2020, 09/06/2019, 03/31/2019, 03/08/2019 ? ?Advanced directives: Please bring a copy of your POA (Power of Attorney) and/or Living Will to your next appointment.  ? ?Conditions/risks identified: none ? ?Next appointment: Follow up in one year for your annual wellness visit.  ? ?Preventive Care 76 Years and Older, Male ?Preventive care refers to lifestyle choices and visits with your health care provider that can promote health and wellness. ?What does preventive care include? ?A yearly physical exam. This is also called an annual well check. ?Dental exams once or twice a year. ?Routine eye exams. Ask your health care provider how often you should have your eyes checked. ?Personal lifestyle choices, including: ?Daily care of your teeth and gums. ?Regular physical activity. ?Eating a healthy diet. ?Avoiding tobacco and drug use. ?Limiting alcohol use. ?Practicing safe sex. ?Taking low doses of aspirin every day. ?Taking vitamin and mineral supplements as recommended by your health care provider. ?What happens during an annual well check? ?The services and screenings done by your health care provider during your annual well check will depend on your age, overall health, lifestyle risk factors, and family history of disease. ?Counseling  ?Your health care provider may ask you questions about  your: ?Alcohol use. ?Tobacco use. ?Drug use. ?Emotional well-being. ?Home and relationship well-being. ?Sexual activity. ?Eating habits. ?History of falls. ?Memory and ability to understand (cognition). ?Work and work Astronomer. ?Screening  ?You may have the following tests or measurements: ?Height, weight, and BMI. ?Blood pressure. ?Lipid and cholesterol levels. These may be checked every 5 years, or more frequently if you are over 72 years old. ?Skin check. ?Lung cancer screening. You may have this screening every year starting at age 76 if you have a 30-pack-year history of smoking and currently smoke or have quit within the past 15 years. ?Fecal occult blood test (FOBT) of the stool. You may have this test every year starting at age 76. ?Flexible sigmoidoscopy or colonoscopy. You may have a sigmoidoscopy every 5 years or a colonoscopy every 10 years starting at age 76. ?Prostate cancer screening. Recommendations will vary depending on your family history and other risks. ?Hepatitis C blood test. ?Hepatitis B blood test. ?Sexually transmitted disease (STD) testing. ?Diabetes screening. This is done by checking your blood sugar (glucose) after you have not eaten for a while (fasting). You may have this done every 1-3 years. ?Abdominal aortic aneurysm (AAA) screening. You may need this if you are a current or former smoker. ?Osteoporosis. You may be screened starting at age 76 if you are at high risk. ?Talk with your health care provider about your test results, treatment options, and if necessary, the need for more tests. ?Vaccines  ?Your health care provider may recommend certain vaccines, such as: ?Influenza vaccine. This is recommended every year. ?Tetanus, diphtheria, and acellular pertussis (Tdap, Td) vaccine. You may need a Td booster every 10 years. ?Zoster vaccine. You may need this after age 76. ?Pneumococcal 13-valent conjugate (PCV13) vaccine. One dose is  recommended after age 4. ?Pneumococcal  polysaccharide (PPSV23) vaccine. One dose is recommended after age 76. ?Talk to your health care provider about which screenings and vaccines you need and how often you need them. ?This information is not intended to replace advice given to you by your health care provider. Make sure you discuss any questions you have with your health care provider. ?Document Released: 02/04/2015 Document Revised: 09/28/2015 Document Reviewed: 11/09/2014 ?Elsevier Interactive Patient Education ? 2017 [place]. ? ?Fall Prevention in the Home ?Falls can cause injuries. They can happen to people of all ages. There are many things you can do to make your home safe and to help prevent falls. ?What can I do on the outside of my home? ?Regularly fix the edges of walkways and driveways and fix any cracks. ?Remove anything that might make you trip as you walk through a door, such as a raised step or threshold. ?Trim any bushes or trees on the path to your home. ?Use bright outdoor lighting. ?Clear any walking paths of anything that might make someone trip, such as rocks or tools. ?Regularly check to see if handrails are loose or broken. Make sure that both sides of any steps have handrails. ?Any raised decks and porches should have guardrails on the edges. ?Have any leaves, snow, or ice cleared regularly. ?Use sand or salt on walking paths during winter. ?Clean up any spills in your garage right away. This includes oil or grease spills. ?What can I do in the bathroom? ?Use night lights. ?Install grab bars by the toilet and in the tub and shower. Do not use towel bars as grab bars. ?Use non-skid mats or decals in the tub or shower. ?If you need to sit down in the shower, use a plastic, non-slip stool. ?Keep the floor dry. Clean up any water that spills on the floor as soon as it happens. ?Remove soap buildup in the tub or shower regularly. ?Attach bath mats securely with double-sided non-slip rug tape. ?Do not have throw rugs and other  things on the floor that can make you trip. ?What can I do in the bedroom? ?Use night lights. ?Make sure that you have a light by your bed that is easy to reach. ?Do not use any sheets or blankets that are too big for your bed. They should not hang down onto the floor. ?Have a firm chair that has side arms. You can use this for support while you get dressed. ?Do not have throw rugs and other things on the floor that can make you trip. ?What can I do in the kitchen? ?Clean up any spills right away. ?Avoid walking on wet floors. ?Keep items that you use a lot in easy-to-reach places. ?If you need to reach something above you, use a strong step stool that has a grab bar. ?Keep electrical cords out of the way. ?Do not use floor polish or wax that makes floors slippery. If you must use wax, use non-skid floor wax. ?Do not have throw rugs and other things on the floor that can make you trip. ?What can I do with my stairs? ?Do not leave any items on the stairs. ?Make sure that there are handrails on both sides of the stairs and use them. Fix handrails that are broken or loose. Make sure that handrails are as long as the stairways. ?Check any carpeting to make sure that it is firmly attached to the stairs. Fix any carpet that is loose or worn. ?Avoid having  throw rugs at the top or bottom of the stairs. If you do have throw rugs, attach them to the floor with carpet tape. ?Make sure that you have a light switch at the top of the stairs and the bottom of the stairs. If you do not have them, ask someone to add them for you. ?What else can I do to help prevent falls? ?Wear shoes that: ?Do not have high heels. ?Have rubber bottoms. ?Are comfortable and fit you well. ?Are closed at the toe. Do not wear sandals. ?If you use a stepladder: ?Make sure that it is fully opened. Do not climb a closed stepladder. ?Make sure that both sides of the stepladder are locked into place. ?Ask someone to hold it for you, if possible. ?Clearly  mark and make sure that you can see: ?Any grab bars or handrails. ?First and last steps. ?Where the edge of each step is. ?Use tools that help you move around (mobility aids) if they are needed. These include: ?Gilmer Mor

## 2021-04-06 NOTE — Progress Notes (Signed)
?I connected with Carlos [name] today by telephone and verified that I am speaking with the correct person using two identifiers. ?Location patient: home ?Location provider: work ?Persons participating in the virtual visit: Carlos [name], Carpio LPN. ?  ?I discussed the limitations, risks, security and privacy concerns of performing an evaluation and management service by telephone and the availability of in person appointments. I also discussed with the patient that there may be a patient responsible charge related to this service. The patient expressed understanding and verbally consented to this telephonic visit.  ?  ?Interactive audio and video telecommunications were attempted between this provider and patient, however failed, due to patient having technical difficulties OR patient did not have access to video capability.  We continued and completed visit with audio only. ? ?  ? ?Vital signs may be patient reported or missing. ? ?Subjective:  ? Carlos [name] is a 76 y.o. male who presents for Medicare Annual/Subsequent preventive examination. ? ?Review of Systems    ? ?Cardiac Risk Factors include: advanced age (>80men, >11 women);male gender ? ?   ?Objective:  ?  ?Today's Vitals  ? 04/06/21 1401  ?Weight: 163 lb (73.9 kg)  ?Height: 5\' 10"  (1.778 m)  ? ?Body mass index is 23.39 kg/m?. ? ?Advanced Directives 04/06/2021 06/02/2020 03/23/2020 07/14/2019 02/19/2019 09/03/2018 08/26/2018  ?Does Patient Have a Medical Advance Directive? Yes No Yes No Yes Yes Yes  ?Type of Paramedic of [place];Living will - [place];Living will - [place];Living will - [place];Living will  ?Does patient want to make changes to medical advance directive? - - - - - No - Patient declined No - Patient declined  ?Copy of [place] in Chart? No - copy requested - No - copy requested - No - copy requested No - copy requested No -  copy requested  ?Would patient like information on creating a medical advance directive? - No - Patient declined - - - - -  ? ? ?Current Medications (verified) ?Outpatient Encounter Medications as of 04/06/2021  ?Medication Sig  ? apixaban (ELIQUIS) 5 MG TABS tablet Take 1 tablet by mouth twice daily  ? benzonatate (TESSALON PERLES) 100 MG capsule Take 1 capsule (100 mg total) by mouth 3 (three) times daily as needed for cough.  ? CALCIUM PO Take 1 tablet by mouth daily. 600 mg daily  ? Cholecalciferol (VITAMIN D PO) Take 1 capsule by mouth daily.   ? guaiFENesin-dextromethorphan (ROBITUSSIN DM) 100-10 MG/5ML syrup Take 5 mLs by mouth every 4 (four) hours as needed for cough.  ? MAGNESIUM PO Take by mouth.  ? metoprolol tartrate (LOPRESSOR) 50 MG tablet Take 1 tablet by mouth twice daily  ? Multiple Vitamin (MULTIVITAMIN) capsule Take 1 capsule by mouth daily.  ? Omega-3 Fatty Acids (FISH OIL PO) Take 1 capsule by mouth. EVERY OTHER DAY  ? pantoprazole (PROTONIX) 40 MG tablet Take 1 tablet (40 mg total) by mouth daily.  ? rosuvastatin (CRESTOR) 10 MG tablet Take 1 tablet by mouth once daily  ? zolpidem (AMBIEN) 10 MG tablet Take 1 tablet (10 mg total) by mouth at bedtime as needed.  ? ?No facility-administered encounter medications on file as of 04/06/2021.  ? ? ?Allergies (verified) ?Patient has no known allergies.  ? ?History: ?Past Medical History:  ?Diagnosis Date  ? Allergic rhinitis   ? Atypical chest pain 09/16/2015  ? Dysrhythmia   ? a-fib/flutter  ? Elevated blood pressure  10/19/2015  ? GERD (gastroesophageal reflux disease)   ? Hypertension   ? Insomnia   ? Left inguinal hernia   ? Mixed hyperlipidemia   ? Pure hypercholesterolemia 10/02/2018  ? PVC (premature ventricular contraction) 10/19/2015  ? Typical atrial flutter ([place]) 10/02/2018  ? ?Past Surgical History:  ?Procedure Laterality Date  ? ASD REPAIR  1994  ? [place]  ? INGUINAL HERNIA REPAIR Left 09/03/2018  ? Procedure: LEFT INGUINAL  HERNIA REPAIR WITH MESH;  Surgeon: Donnie Mesa, MD;  Location: [facility];  Service: General;  Laterality: Left;  ? ?Family History  ?Problem Relation Age of Onset  ? Healthy Mother   ? Heart attack Father   ? Pulmonary embolism Brother   ? ?Social History  ? ?Socioeconomic History  ? Marital status: Divorced  ?  Spouse name: Not on file  ? Number of children: Not on file  ? Years of education: Not on file  ? Highest education level: Not on file  ?Occupational History  ? Occupation: retired  ?Tobacco Use  ? Smoking status: Former  ?  Packs/day: 0.50  ?  Years: 9.00  ?  Pack years: 4.50  ?  Types: Cigarettes  ?  Quit date: 01/22/2006  ?  Years since quitting: 15.2  ? Smokeless tobacco: Never  ?Vaping Use  ? Vaping Use: Never used  ?Substance and Sexual Activity  ? Alcohol use: Yes  ?  Comment: 10-12 drinks per week  ? Drug use: No  ? Sexual activity: Not Currently  ?Other Topics Concern  ? Not on file  ?Social History Narrative  ? Drinks 1-2 cups of caffeine daily.  ? ?Social Determinants of Health  ? ?Financial Resource Strain: Low Risk   ? Difficulty of Paying Living Expenses: Not hard at all  ?Food Insecurity: No Food Insecurity  ? Worried About Charity fundraiser in the Last Year: Never true  ? Ran Out of Food in the Last Year: Never true  ?Transportation Needs: No Transportation Needs  ? Lack of Transportation (Medical): No  ? Lack of Transportation (Non-Medical): No  ?Physical Activity: Sufficiently Active  ? Days of Exercise per Week: 7 days  ? Minutes of Exercise per Session: 60 min  ?Stress: Stress Concern Present  ? Feeling of Stress : To some extent  ?Social Connections: Not on file  ? ? ?Tobacco Counseling ?Counseling given: Not Answered ? ? ?Clinical Intake: ? ?Pre-visit preparation completed: Yes ? ?Pain : No/denies pain ? ?  ? ?Nutritional Status: BMI 25 -29 Overweight ?Nutritional Risks: None ?Diabetes: No ? ?How often do you need to have someone help you when you read instructions,  pamphlets, or other written materials from your doctor or pharmacy?: 1 - Never ?What is the last grade level you completed in school?: PhD ? ?Diabetic? no ? ?Interpreter Needed?: No ? ?Information entered by :: NAllen LPN ? ? ?Activities of Daily Living ?In your present state of health, do you have any difficulty performing the following activities: 04/06/2021  ?Hearing? N  ?Vision? N  ?Difficulty concentrating or making decisions? N  ?Walking or climbing stairs? N  ?Dressing or bathing? N  ?Doing errands, shopping? N  ?Preparing Food and eating ? N  ?Using the Toilet? N  ?In the past six months, have you accidently leaked urine? N  ?Do you have problems with loss of bowel control? N  ?Managing your Medications? N  ?Managing your Finances? N  ?Housekeeping or managing your  Housekeeping? N  ?Some recent data might be hidden  ? ? ?Patient Care Team: ?Glendale Chard, MD as PCP - General (Internal Medicine) ?Skeet Latch, MD as PCP - Cardiology (Cardiology) ? ?Indicate any recent Medical Services you may have received from other than [facility] providers in the past year (date may be approximate). ? ?   ?Assessment:  ? This is a routine wellness examination for Carlos [name]. ? ?Hearing/Vision screen ?Vision Screening - Comments:: Regular eye exams, Dr. Valetta Close ? ?Dietary issues and exercise activities discussed: ?Current Exercise Habits: Home exercise routine, Type of exercise: walking, Time (Minutes): 60, Frequency (Times/Week): 7, Weekly Exercise (Minutes/Week): 420 ? ? Goals Addressed   ? ?  ?  ?  ?  ? This Visit's Progress  ?  Patient Stated     ?  04/06/2021, no goals ?  ? ?  ? ?Depression Screen ?PHQ 2/9 Scores 04/06/2021 03/23/2020 02/19/2019 12/02/2018 10/13/2018 07/24/2018 07/14/2018  ?PHQ - 2 Score 0 0 0 6 0 0 0  ?PHQ- 9 Score - - 0 12 - - -  ?  ?Fall Risk ?Fall Risk  04/06/2021 03/23/2020 02/19/2019 12/02/2018 10/13/2018  ?Falls in the past year? 0 0 0 0 0  ?Risk for fall due to : Medication side effect Medication side effect  Medication side effect - -  ?Follow up Falls evaluation completed;Education provided;Falls prevention discussed Falls evaluation completed;Education provided;Falls prevention discussed Falls evaluation compl

## 2021-04-10 ENCOUNTER — Other Ambulatory Visit: Payer: Self-pay | Admitting: Internal Medicine

## 2021-04-10 DIAGNOSIS — F5104 Psychophysiologic insomnia: Secondary | ICD-10-CM

## 2021-04-12 ENCOUNTER — Other Ambulatory Visit: Payer: Self-pay | Admitting: Internal Medicine

## 2021-04-12 DIAGNOSIS — F5104 Psychophysiologic insomnia: Secondary | ICD-10-CM

## 2021-04-12 MED ORDER — ZOLPIDEM TARTRATE 10 MG PO TABS: 10.0000 mg | ORAL_TABLET | Freq: Every evening | ORAL | 1 refills | Status: DC | PRN

## 2021-04-13 ENCOUNTER — Encounter: Payer: Self-pay | Admitting: Internal Medicine

## 2021-04-17 ENCOUNTER — Encounter: Payer: Self-pay | Admitting: Internal Medicine

## 2021-04-17 ENCOUNTER — Other Ambulatory Visit: Payer: Self-pay | Admitting: Internal Medicine

## 2021-04-17 MED ORDER — LEVOCETIRIZINE DIHYDROCHLORIDE 5 MG PO TABS: 5.0000 mg | ORAL_TABLET | Freq: Every evening | ORAL | 1 refills | Status: DC

## 2021-05-06 ENCOUNTER — Other Ambulatory Visit: Payer: Self-pay | Admitting: Cardiovascular Disease

## 2021-05-08 NOTE — Telephone Encounter (Signed)
Rx(s) sent to pharmacy electronically.  

## 2021-06-12 ENCOUNTER — Other Ambulatory Visit: Payer: Self-pay | Admitting: Cardiovascular Disease

## 2021-06-12 ENCOUNTER — Other Ambulatory Visit: Payer: Self-pay | Admitting: Internal Medicine

## 2021-06-12 DIAGNOSIS — F5104 Psychophysiologic insomnia: Secondary | ICD-10-CM

## 2021-06-12 NOTE — Telephone Encounter (Signed)
Prescription refill request for Eliquis received. Indication:Afib Last office visit:9/22 Scr:0.9 Age: 76 Weight:73.9 kg  Prescription refilled

## 2021-07-17 ENCOUNTER — Encounter: Payer: Self-pay | Admitting: Internal Medicine

## 2021-07-17 ENCOUNTER — Other Ambulatory Visit: Payer: Self-pay

## 2021-07-17 ENCOUNTER — Encounter (HOSPITAL_BASED_OUTPATIENT_CLINIC_OR_DEPARTMENT_OTHER): Payer: Self-pay | Admitting: Cardiovascular Disease

## 2021-07-17 MED ORDER — PANTOPRAZOLE SODIUM 40 MG PO TBEC: 40.0000 mg | DELAYED_RELEASE_TABLET | Freq: Every day | ORAL | 3 refills | Status: DC

## 2021-07-17 MED ORDER — PANTOPRAZOLE SODIUM 40 MG PO TBEC: 40.0000 mg | DELAYED_RELEASE_TABLET | Freq: Two times a day (BID) | ORAL | 3 refills | Status: DC

## 2021-08-11 ENCOUNTER — Other Ambulatory Visit: Payer: Self-pay | Admitting: Internal Medicine

## 2021-08-11 DIAGNOSIS — F5104 Psychophysiologic insomnia: Secondary | ICD-10-CM

## 2021-08-16 ENCOUNTER — Ambulatory Visit (INDEPENDENT_AMBULATORY_CARE_PROVIDER_SITE_OTHER): Payer: Medicare PPO | Admitting: Nurse Practitioner

## 2021-08-16 ENCOUNTER — Encounter: Payer: Self-pay | Admitting: Nurse Practitioner

## 2021-08-16 ENCOUNTER — Other Ambulatory Visit: Payer: Self-pay | Admitting: Internal Medicine

## 2021-08-16 VITALS — BP 122/62 | HR 93 | Temp 98.4°F | Ht 70.0 in | Wt 163.0 lb

## 2021-08-16 DIAGNOSIS — R051 Acute cough: Secondary | ICD-10-CM | POA: Diagnosis not present

## 2021-08-16 DIAGNOSIS — F5104 Psychophysiologic insomnia: Secondary | ICD-10-CM

## 2021-08-16 DIAGNOSIS — J00 Acute nasopharyngitis [common cold]: Secondary | ICD-10-CM

## 2021-08-16 MED ORDER — ZOLPIDEM TARTRATE 10 MG PO TABS: 10.0000 mg | ORAL_TABLET | Freq: Every evening | ORAL | 0 refills | Status: DC | PRN

## 2021-08-16 MED ORDER — PROMETHAZINE-DM 6.25-15 MG/5ML PO SYRP: 5.0000 mL | ORAL_SOLUTION | Freq: Four times a day (QID) | ORAL | 0 refills | Status: DC | PRN

## 2021-08-16 NOTE — Patient Instructions (Signed)

## 2021-08-16 NOTE — Progress Notes (Signed)
I,Tianna Badgett,acting as a Neurosurgeon for [employer], FNP.,have documented all relevant documentation on the behalf of Arnette Felts, FNP,as directed by  Arnette Felts, FNP while in the presence of Arnette Felts, FNP.  Subjective:     Patient ID: Carlos [name] , male    DOB: 01-17-1946 , 76 y.o.   MRN: [id number]   Chief Complaint  Patient presents with   Cough    HPI  Patient presents today for cough and runny nose. He did not check a home covid test at home. He has had these symptoms since Saturday (08/12/2021). He has not taken any medications. He is using a home remedy with tumeric and honey.   Cough This is a new problem. The current episode started in the past 7 days. Associated symptoms include rhinorrhea. Pertinent negatives include no chills or shortness of breath. There is no history of asthma.     Past Medical History:  Diagnosis Date   Allergic rhinitis    Atypical chest pain 09/16/2015   Dysrhythmia    a-fib/flutter   Elevated blood pressure 10/19/2015   GERD (gastroesophageal reflux disease)    Hypertension    Insomnia    Left inguinal hernia    Mixed hyperlipidemia    Pure hypercholesterolemia 10/02/2018   PVC (premature ventricular contraction) 10/19/2015   Typical atrial flutter ([facility]) 10/02/2018     Family History  Problem Relation Age of Onset   Healthy Mother    Heart attack Father    Pulmonary embolism Brother      Current Outpatient Medications:    promethazine-dextromethorphan (PROMETHAZINE-DM) 6.25-15 MG/5ML syrup, Take 5 mLs by mouth 4 (four) times daily as needed for cough., Disp: 118 mL, Rfl: 0   apixaban (ELIQUIS) 5 MG TABS tablet, Take 1 tablet by mouth twice daily, Disp: 180 tablet, Rfl: 1   benzonatate (TESSALON PERLES) 100 MG capsule, Take 1 capsule (100 mg total) by mouth 3 (three) times daily as needed for cough., Disp: 30 capsule, Rfl: 0   CALCIUM PO, Take 1 tablet by mouth daily. 600 mg daily, Disp: , Rfl:    Cholecalciferol (VITAMIN D  PO), Take 1 capsule by mouth daily. , Disp: , Rfl:    levocetirizine (XYZAL) 5 MG tablet, Take 1 tablet (5 mg total) by mouth every evening., Disp: 90 tablet, Rfl: 1   MAGNESIUM PO, Take by mouth., Disp: , Rfl:    metoprolol tartrate (LOPRESSOR) 50 MG tablet, Take 1 tablet by mouth twice daily, Disp: 180 tablet, Rfl: 1   Multiple Vitamin (MULTIVITAMIN) capsule, Take 1 capsule by mouth daily., Disp: , Rfl:    Omega-3 Fatty Acids (FISH OIL PO), Take 1 capsule by mouth. EVERY OTHER DAY, Disp: , Rfl:    pantoprazole (PROTONIX) 40 MG tablet, Take 1 tablet (40 mg total) by mouth 2 (two) times daily., Disp: 180 tablet, Rfl: 3   rosuvastatin (CRESTOR) 10 MG tablet, Take 1 tablet by mouth once daily, Disp: 90 tablet, Rfl: 0   zolpidem (AMBIEN) 10 MG tablet, Take 1 tablet (10 mg total) by mouth at bedtime as needed., Disp: 30 tablet, Rfl: 0   No Known Allergies   Review of Systems  Constitutional: Negative.  Negative for chills.  HENT:  Positive for rhinorrhea.   Respiratory:  Positive for cough. Negative for shortness of breath.   Cardiovascular: Negative.   Gastrointestinal: Negative.   Neurological: Negative.   Psychiatric/Behavioral: Negative.       Today's Vitals   08/16/21 1229  BP: 122/62  Pulse: 93  Temp: 98.4 F (36.9 C)  TempSrc: Oral  Weight: 163 lb (73.9 kg)  Height: 5\' 10"  (1.778 m)   Body mass index is 23.39 kg/m.   Objective:  Physical Exam Vitals reviewed.  Constitutional:      General: He is not in acute distress.    Appearance: Normal appearance.  Cardiovascular:     Rate and Rhythm: Normal rate and regular rhythm.     Pulses: Normal pulses.     Heart sounds: Normal heart sounds. No murmur heard. Pulmonary:     Effort: Pulmonary effort is normal. No respiratory distress.     Breath sounds: Normal breath sounds. No wheezing.  Skin:    General: Skin is warm and dry.     Capillary Refill: Capillary refill takes less than 2 seconds.  Neurological:     General:  No focal deficit present.     Mental Status: He is alert and oriented to person, place, and time.     Cranial Nerves: No cranial nerve deficit.     Motor: No weakness.  Psychiatric:        Mood and Affect: Mood normal.        Behavior: Behavior normal.        Thought Content: Thought content normal.        Judgment: Judgment normal.         Assessment And Plan:     1. Acute cough Comments: Negative rapid strep, will send rx for cough syrup. Encouraged to stay well hydrated with water and avoid taking cough syrup when driving. - Novel Coronavirus, NAA ([facility]) - POC COVID-19  2. Acute rhinitis - Novel Coronavirus, NAA ([facility]) - POC COVID-19  3. Chronic insomnia Comments: I have sent a rx refill for ambien, he has a follow up with Dr. on 8/24 - zolpidem (AMBIEN) 10 MG tablet; Take 1 tablet (10 mg total) by mouth at bedtime as needed.  Dispense: 30 tablet; Refill: 0     Patient was given opportunity to ask questions. Patient verbalized understanding of the plan and was able to repeat key elements of the plan. All questions were answered to their satisfaction.  9/24, FNP   I, Arnette Felts, FNP, have reviewed all documentation for this visit. The documentation on 07/2621 for the exam, diagnosis, procedures, and orders are all accurate and complete.   IF YOU HAVE BEEN REFERRED TO A SPECIALIST, IT MAY TAKE 1-2 WEEKS TO SCHEDULE/PROCESS THE REFERRAL. IF YOU HAVE NOT HEARD FROM US/SPECIALIST IN TWO WEEKS, PLEASE GIVE 08/2621 A CALL AT [phone] X [phone].   THE PATIENT IS ENCOURAGED TO PRACTICE SOCIAL DISTANCING DUE TO THE COVID-19 PANDEMIC.

## 2021-08-17 LAB — NOVEL CORONAVIRUS, NAA: SARS-CoV-2, NAA: NOT DETECTED

## 2021-09-03 LAB — POC COVID19 BINAXNOW: SARS Coronavirus 2 Ag: NEGATIVE

## 2021-09-07 ENCOUNTER — Other Ambulatory Visit: Payer: Self-pay | Admitting: Nurse Practitioner

## 2021-09-07 ENCOUNTER — Other Ambulatory Visit: Payer: Self-pay | Admitting: Internal Medicine

## 2021-09-07 DIAGNOSIS — F5104 Psychophysiologic insomnia: Secondary | ICD-10-CM

## 2021-09-07 MED ORDER — ZOLPIDEM TARTRATE 10 MG PO TABS: 10.0000 mg | ORAL_TABLET | Freq: Every evening | ORAL | 1 refills | Status: DC | PRN

## 2021-09-12 ENCOUNTER — Ambulatory Visit (INDEPENDENT_AMBULATORY_CARE_PROVIDER_SITE_OTHER): Payer: Medicare PPO | Admitting: Internal Medicine

## 2021-09-12 ENCOUNTER — Encounter: Payer: Self-pay | Admitting: Internal Medicine

## 2021-09-12 VITALS — BP 122/80 | HR 89 | Temp 98.1°F | Ht 70.0 in | Wt 159.8 lb

## 2021-09-12 DIAGNOSIS — E7800 Pure hypercholesterolemia, unspecified: Secondary | ICD-10-CM

## 2021-09-12 DIAGNOSIS — F1010 Alcohol abuse, uncomplicated: Secondary | ICD-10-CM

## 2021-09-12 DIAGNOSIS — R7309 Other abnormal glucose: Secondary | ICD-10-CM | POA: Diagnosis not present

## 2021-09-12 DIAGNOSIS — F5104 Psychophysiologic insomnia: Secondary | ICD-10-CM

## 2021-09-12 DIAGNOSIS — D6869 Other thrombophilia: Secondary | ICD-10-CM

## 2021-09-12 DIAGNOSIS — J069 Acute upper respiratory infection, unspecified: Secondary | ICD-10-CM | POA: Diagnosis not present

## 2021-09-12 DIAGNOSIS — I480 Paroxysmal atrial fibrillation: Secondary | ICD-10-CM

## 2021-09-12 DIAGNOSIS — Z79899 Other long term (current) drug therapy: Secondary | ICD-10-CM

## 2021-09-12 MED ORDER — METOPROLOL TARTRATE 50 MG PO TABS: 50.0000 mg | ORAL_TABLET | Freq: Two times a day (BID) | ORAL | 1 refills | Status: DC

## 2021-09-12 MED ORDER — DOXYCYCLINE HYCLATE 100 MG PO TABS
100.0000 mg | ORAL_TABLET | Freq: Two times a day (BID) | ORAL | 0 refills | Status: DC
Start: 2021-09-12 — End: 2022-01-29

## 2021-09-12 MED ORDER — ROSUVASTATIN CALCIUM 10 MG PO TABS
10.0000 mg | ORAL_TABLET | Freq: Every day | ORAL | 2 refills | Status: DC
Start: 2021-09-12 — End: 2022-04-26

## 2021-09-12 NOTE — Patient Instructions (Addendum)

## 2021-09-12 NOTE — Progress Notes (Signed)
I,Victoria T Hamilton,acting as a scribe for Maximino Greenland, MD.,have documented all relevant documentation on the behalf of Maximino Greenland, MD,as directed by  Maximino Greenland, MD while in the presence of Maximino Greenland, MD.    Subjective:     Patient ID: Carlos [name] , male    DOB: November 10, 1945 , 76 y.o.   MRN: [id number]   Chief Complaint  Patient presents with   Hyperlipidemia    HPI  Pt presents today for chol f/u. He denies chest pain, SOB, headache.      Past Medical History:  Diagnosis Date   Allergic rhinitis    Atypical chest pain 09/16/2015   Dysrhythmia    a-fib/flutter   Elevated blood pressure 10/19/2015   GERD (gastroesophageal reflux disease)    Hypertension    Insomnia    Left inguinal hernia    Mixed hyperlipidemia    Pure hypercholesterolemia 10/02/2018   PVC (premature ventricular contraction) 10/19/2015   Typical atrial flutter ([place]) 10/02/2018     Family History  Problem Relation Age of Onset   Healthy Mother    Heart attack Father    Pulmonary embolism Brother      Current Outpatient Medications:    apixaban (ELIQUIS) 5 MG TABS tablet, Take 1 tablet by mouth twice daily, Disp: 180 tablet, Rfl: 1   CALCIUM PO, Take 1 tablet by mouth daily. 600 mg daily, Disp: , Rfl:    Cholecalciferol (VITAMIN D PO), Take 1 capsule by mouth daily. , Disp: , Rfl:    doxycycline (VIBRA-TABS) 100 MG tablet, Take 1 tablet (100 mg total) by mouth 2 (two) times daily., Disp: 14 tablet, Rfl: 0   levocetirizine (XYZAL) 5 MG tablet, Take 1 tablet (5 mg total) by mouth every evening., Disp: 90 tablet, Rfl: 1   MAGNESIUM PO, Take by mouth., Disp: , Rfl:    Multiple Vitamin (MULTIVITAMIN) capsule, Take 1 capsule by mouth daily., Disp: , Rfl:    Omega-3 Fatty Acids (FISH OIL PO), Take 1 capsule by mouth. EVERY OTHER DAY, Disp: , Rfl:    pantoprazole (PROTONIX) 40 MG tablet, Take 1 tablet (40 mg total) by mouth 2 (two) times daily., Disp: 180 tablet, Rfl: 3   zolpidem  (AMBIEN) 10 MG tablet, Take 1 tablet (10 mg total) by mouth at bedtime as needed., Disp: 30 tablet, Rfl: 1   benzonatate (TESSALON PERLES) 100 MG capsule, Take 1 capsule (100 mg total) by mouth 3 (three) times daily as needed for cough. (Patient not taking: Reported on 09/12/2021), Disp: 30 capsule, Rfl: 0   metoprolol tartrate (LOPRESSOR) 50 MG tablet, Take 1 tablet (50 mg total) by mouth 2 (two) times daily., Disp: 180 tablet, Rfl: 1   promethazine-dextromethorphan (PROMETHAZINE-DM) 6.25-15 MG/5ML syrup, Take 5 mLs by mouth 4 (four) times daily as needed for cough. (Patient not taking: Reported on 09/12/2021), Disp: 118 mL, Rfl: 0   rosuvastatin (CRESTOR) 10 MG tablet, Take 1 tablet (10 mg total) by mouth daily., Disp: 90 tablet, Rfl: 2   No Known Allergies   Review of Systems  Constitutional: Negative.   HENT:  Positive for postnasal drip.   Respiratory: Negative.    Gastrointestinal: Negative.   Musculoskeletal: Negative.   Skin: Negative.   Allergic/Immunologic: Negative.   Neurological: Negative.   Hematological: Negative.      Today's Vitals   09/12/21 0927 09/12/21 1013  BP: (!) 122/90 122/80  Pulse: 89   Temp: 98.1 F (36.7 C)   SpO2: 98%  Weight: 159 lb 12.8 oz (72.5 kg)   Height: _0  (1.778 m)   PainSc: 0-No pain    Body mass index is 22.93 kg/m.  Wt Readings from Last 3 Encounters:  09/12/21 159 lb 12.8 oz (72.5 kg)  08/16/21 163 lb (73.9 kg)  04/06/21 163 lb (73.9 kg)    Objective:  Physical Exam Vitals and nursing note reviewed.  Constitutional:      Appearance: Normal appearance.  HENT:     Head: Normocephalic and atraumatic.  Eyes:     Extraocular Movements: Extraocular movements intact.  Cardiovascular:     Rate and Rhythm: Normal rate and regular rhythm.     Heart sounds: Normal heart sounds.  Pulmonary:     Effort: Pulmonary effort is normal.     Breath sounds: Wheezing and rhonchi present.  Musculoskeletal:     Cervical back: Normal range of  motion.  Skin:    General: Skin is warm.  Neurological:     General: No focal deficit present.     Mental Status: He is alert.  Psychiatric:        Mood and Affect: Mood normal.      Assessment And Plan:     1. Pure hypercholesterolemia Comments: Chronic, currently on rosuvastatin 58m daily. LDL goal <70. Importance of medication/dietary compliance was d/w patient. F/u in 6 months for physical exam. - CMP14+EGFR - Lipid panel - CBC  2. PAF (paroxysmal atrial fibrillation) (HCC) Comments: He is in sinus rhythm today.   3. Upper respiratory tract infection, unspecified type Comments: He is encouraged to cut back on his dairy intake. I will send rx abx, doxycycline.   4. Chronic insomnia Comments: Chronic, he will c/w zolpidem. He is also advised to consider drinking Tulsi nightly.   5. Alcohol use disorder, mild, abuse Comments: He declines services at [facility]. I will refer him to Dr. RReece Levy he is agreeable to his treatment plan.  - Ambulatory referral to Psychiatry  6. Elevated hemoglobin A1c Comments: His a1c has been elevated in the past. I will recheck this today. He is encouraged to limit his intake of sugary beverages and foods.  - Hemoglobin A1c  7. [place] Comments: He will continue with Eliquis due to underlying PAF.   8. Drug therapy   Patient was given opportunity to ask questions. Patient verbalized understanding of the plan and was able to repeat key elements of the plan. All questions were answered to their satisfaction.   I, RMaximino Greenland MD, have reviewed all documentation for this visit. The documentation on 09/12/21 for the exam, diagnosis, procedures, and orders are all accurate and complete.   IF YOU HAVE BEEN REFERRED TO A SPECIALIST, IT MAY TAKE 1-2 WEEKS TO SCHEDULE/PROCESS THE REFERRAL. IF YOU HAVE NOT HEARD FROM US/SPECIALIST IN TWO WEEKS, PLEASE GIVE [place] CALL AT [phone].   THE PATIENT IS ENCOURAGED TO PRACTICE  SOCIAL DISTANCING DUE TO THE COVID-19 PANDEMIC.

## 2021-09-13 LAB — LIPID PANEL
Chol/HDL Ratio: 4.9 ratio (ref 0.0–5.0)
Cholesterol, Total: 207 mg/dL — ABNORMAL HIGH (ref 100–199)
HDL: 42 mg/dL (ref >39)
LDL Chol Calc (NIH): 137 mg/dL — ABNORMAL HIGH (ref 0–99)
Triglycerides: 157 mg/dL — ABNORMAL HIGH (ref 0–149)
VLDL Cholesterol Cal: 28 mg/dL (ref 5–40)

## 2021-09-13 LAB — CMP14+EGFR
ALT: 14 IU/L (ref 0–44)
AST: 18 IU/L (ref 0–40)
Albumin/Globulin Ratio: 1.7 (ref 1.2–2.2)
Albumin: 4.3 g/dL (ref 3.8–4.8)
Alkaline Phosphatase: 105 IU/L (ref 44–121)
BUN/Creatinine Ratio: 16 (ref 10–24)
BUN: 16 mg/dL (ref 8–27)
Bilirubin Total: 0.8 mg/dL (ref 0.0–1.2)
CO2: 24 mmol/L (ref 20–29)
Calcium: 9.4 mg/dL (ref 8.6–10.2)
Chloride: 101 mmol/L (ref 96–106)
Creatinine, Ser: 0.98 mg/dL (ref 0.76–1.27)
Globulin, Total: 2.6 g/dL (ref 1.5–4.5)
Glucose: 114 mg/dL — ABNORMAL HIGH (ref 70–99)
Potassium: 4.6 mmol/L (ref 3.5–5.2)
Sodium: 139 mmol/L (ref 134–144)
Total Protein: 6.9 g/dL (ref 6.0–8.5)
eGFR: 80 mL/min/{1.73_m2} (ref >59)

## 2021-09-13 LAB — CBC
Hematocrit: 48.5 % (ref 37.5–51.0)
Hemoglobin: 16.2 g/dL (ref 13.0–17.7)
MCH: 29.7 pg (ref 26.6–33.0)
MCHC: 33.4 g/dL (ref 31.5–35.7)
MCV: 89 fL (ref 79–97)
Platelets: 277 10*3/uL (ref 150–450)
RBC: 5.46 x10E6/uL (ref 4.14–5.80)
RDW: 13.1 % (ref 11.6–15.4)
WBC: 7 10*3/uL (ref 3.4–10.8)

## 2021-09-13 LAB — HEMOGLOBIN A1C
Est. average glucose Bld gHb Est-mCnc: 134 mg/dL
Hgb A1c MFr Bld: 6.3 % — ABNORMAL HIGH (ref 4.8–5.6)

## 2021-09-14 ENCOUNTER — Encounter: Payer: Self-pay | Admitting: Internal Medicine

## 2021-09-30 ENCOUNTER — Other Ambulatory Visit: Payer: Self-pay

## 2021-09-30 ENCOUNTER — Encounter (HOSPITAL_COMMUNITY): Payer: Self-pay

## 2021-09-30 ENCOUNTER — Emergency Department (HOSPITAL_COMMUNITY)
Admission: EM | Admit: 2021-09-30 | Discharge: 2021-09-30 | Disposition: A | Discharge status: Home / Self Care | Payer: Medicare PPO | Attending: Emergency Medicine | Admitting: Emergency Medicine

## 2021-09-30 ENCOUNTER — Emergency Department (HOSPITAL_COMMUNITY): Payer: Medicare PPO

## 2021-09-30 DIAGNOSIS — Z7901 Long term (current) use of anticoagulants: Secondary | ICD-10-CM | POA: Diagnosis not present

## 2021-09-30 DIAGNOSIS — Z79899 Other long term (current) drug therapy: Secondary | ICD-10-CM | POA: Insufficient documentation

## 2021-09-30 DIAGNOSIS — R079 Chest pain, unspecified: Secondary | ICD-10-CM | POA: Insufficient documentation

## 2021-09-30 DIAGNOSIS — R0789 Other chest pain: Secondary | ICD-10-CM | POA: Diagnosis not present

## 2021-09-30 DIAGNOSIS — I10 Essential (primary) hypertension: Secondary | ICD-10-CM | POA: Diagnosis not present

## 2021-09-30 LAB — BASIC METABOLIC PANEL
Anion gap: 7 (ref 5–15)
BUN: 18 mg/dL (ref 8–23)
CO2: 28 mmol/L (ref 22–32)
Calcium: 9.7 mg/dL (ref 8.9–10.3)
Chloride: 104 mmol/L (ref 98–111)
Creatinine, Ser: 0.99 mg/dL (ref 0.61–1.24)
GFR, Estimated: >60 mL/min (ref >60)
Glucose, Bld: 105 mg/dL — ABNORMAL HIGH (ref 70–99)
Potassium: 3.9 mmol/L (ref 3.5–5.1)
Sodium: 139 mmol/L (ref 135–145)

## 2021-09-30 LAB — CBC
HCT: 49.1 % (ref 39.0–52.0)
Hemoglobin: 16.2 g/dL (ref 13.0–17.0)
MCH: 29.3 pg (ref 26.0–34.0)
MCHC: 33 g/dL (ref 30.0–36.0)
MCV: 88.9 fL (ref 80.0–100.0)
Platelets: 306 10*3/uL (ref 150–400)
RBC: 5.52 MIL/uL (ref 4.22–5.81)
RDW: 12.6 % (ref 11.5–15.5)
WBC: 8 10*3/uL (ref 4.0–10.5)
nRBC: 0 % (ref 0.0–0.2)

## 2021-09-30 LAB — TROPONIN I (HIGH SENSITIVITY)
Troponin I (High Sensitivity): 5 ng/L (ref <18)
Troponin I (High Sensitivity): 5 ng/L (ref <18)

## 2021-09-30 LAB — D-DIMER, QUANTITATIVE: D-Dimer, Quant: <0.27 ug/mL-FEU (ref 0.00–0.50)

## 2021-09-30 LAB — PROTIME-INR
INR: 1.4 — ABNORMAL HIGH (ref 0.8–1.2)
Prothrombin Time: 16.8 seconds — ABNORMAL HIGH (ref 11.4–15.2)

## 2021-09-30 NOTE — ED Provider Notes (Signed)
Accepted handoff at shift change from [facility]. Please see prior provider note for more detail.   Briefly: Patient is 76 y.o.   DDX: concern for atrial flutter, PE, ACS  Plan: r/o PE and ACS  RISR  EDTHIS  Physical Exam  BP 122/82   Pulse 77   Temp 98 F (36.7 C) (Oral)   Resp 18   Ht 5\' 10"  (1.778 m)   Wt 72.6 kg   SpO2 100%   BMI 22.96 kg/m   Physical Exam  Procedures  Procedures  ED Course / MDM    Medical Decision Making Amount and/or Complexity of Data Reviewed Labs: ordered. Radiology: ordered.          , PA-C 09/30/21 1514    11/30/21, MD 10/02/21 1044

## 2021-09-30 NOTE — ED Notes (Signed)
Pt states understanding of dc instructions, importance of follow up.  Pt denies questions or concerns upon dc. Pt declined wheelchair assistance upon dc. Pt ambulated out of ed w/ steady gait. No belongings left in room upon dc.  

## 2021-09-30 NOTE — ED Notes (Signed)
Pt ambulated to the restroom w/ steady gait w/out need for assistance. °

## 2021-09-30 NOTE — ED Provider Notes (Signed)
[facility]-EMERGENCY DEPT Provider Note   CSN: [id number] Arrival date & time: 09/30/21  1334     History  Chief Complaint  Patient presents with   Chest Pain    Carlos [name] is a 76 y.o. male with medical history of typical atrial flutter, PVC, atypical chest pain, GERD, hypertension.  Patient presents to ED for evaluation of right-sided chest pain.  Patient reports that this morning around 9 AM he was in his usual state of health when he developed right-sided chest pain that does not radiate, is not associated with exertion, is not associated with shortness of breath.  Patient initially believed this was due to him needing to have a bowel movement however he states that he had a bowel movement and this did not relieve his chest pain.  Patient reports that taking deep breaths worsens his chest pain.  The patient states he had recent travel where he flew to [place] as well as [place] for cruise.  Patient states he is currently on Eliquis.  Patient reports being seen by cardiology here in town.  The patient states he was recently psychologic 4 months ago.  Patient denies any fevers, nausea, vomit, abdominal pain, diarrhea, dysuria, back pain, lightheadedness, dizziness, weakness.   Chest Pain Associated symptoms: no abdominal pain, no back pain, no dizziness, no fever, no nausea, no shortness of breath, no vomiting and no weakness        Home Medications Prior to Admission medications   Medication Sig Start Date End Date Taking? Authorizing Provider  apixaban (ELIQUIS) 5 MG TABS tablet Take 1 tablet by mouth twice daily 06/12/21   Chilton Si, MD  benzonatate (TESSALON PERLES) 100 MG capsule Take 1 capsule (100 mg total) by mouth 3 (three) times daily as needed for cough. Patient not taking: Reported on 09/12/2021 11/29/20 11/29/21  Charlesetta Ivory, NP  CALCIUM PO Take 1 tablet by mouth daily. 600 mg daily    [provider]  Cholecalciferol (VITAMIN D  PO) Take 1 capsule by mouth daily.     [provider]  doxycycline (VIBRA-TABS) 100 MG tablet Take 1 tablet (100 mg total) by mouth 2 (two) times daily. 09/12/21   Dorothyann Peng, MD  levocetirizine (XYZAL) 5 MG tablet Take 1 tablet (5 mg total) by mouth every evening. 04/17/21   Dorothyann Peng, MD  MAGNESIUM PO Take by mouth.    [provider]  metoprolol tartrate (LOPRESSOR) 50 MG tablet Take 1 tablet (50 mg total) by mouth 2 (two) times daily. 09/12/21   Dorothyann Peng, MD  Multiple Vitamin (MULTIVITAMIN) capsule Take 1 capsule by mouth daily.    [provider]  Omega-3 Fatty Acids (FISH OIL PO) Take 1 capsule by mouth. EVERY OTHER DAY    [provider]  pantoprazole (PROTONIX) 40 MG tablet Take 1 tablet (40 mg total) by mouth 2 (two) times daily. 07/17/21   Dorothyann Peng, MD  promethazine-dextromethorphan (PROMETHAZINE-DM) 6.25-15 MG/5ML syrup Take 5 mLs by mouth 4 (four) times daily as needed for cough. Patient not taking: Reported on 09/12/2021 08/16/21   Arnette Felts, FNP  rosuvastatin (CRESTOR) 10 MG tablet Take 1 tablet (10 mg total) by mouth daily. 09/12/21   Dorothyann Peng, MD  zolpidem (AMBIEN) 10 MG tablet Take 1 tablet (10 mg total) by mouth at bedtime as needed. 09/07/21   Dorothyann Peng, MD      Allergies    Patient has no known allergies.    Review of Systems  Review of Systems  Constitutional:  Negative for fever.  Respiratory:  Negative for shortness of breath.   Cardiovascular:  Positive for chest pain. Negative for leg swelling.  Gastrointestinal:  Negative for abdominal pain, nausea and vomiting.  Genitourinary:  Negative for dysuria.  Musculoskeletal:  Negative for back pain.  Neurological:  Negative for dizziness, weakness and light-headedness.  All other systems reviewed and are negative.   Physical Exam Updated Vital Signs BP 122/82   Pulse 77   Temp 98 F (36.7 C) (Oral)   Resp 18   Ht 5\' 10"  (1.778 m)   Wt 72.6 kg    SpO2 100%   BMI 22.96 kg/m  Physical Exam Vitals and nursing note reviewed.  Constitutional:      General: He is not in acute distress.    Appearance: Normal appearance. He is not ill-appearing, toxic-appearing or diaphoretic.  HENT:     Head: Normocephalic and atraumatic.     Nose: Nose normal. No congestion.     Mouth/Throat:     Mouth: Mucous membranes are moist.     Pharynx: Oropharynx is clear.  Eyes:     Extraocular Movements: Extraocular movements intact.     Conjunctiva/sclera: Conjunctivae normal.     Pupils: Pupils are equal, round, and reactive to light.  Cardiovascular:     Rate and Rhythm: Normal rate and regular rhythm.  Pulmonary:     Effort: Pulmonary effort is normal.     Breath sounds: Normal breath sounds. No wheezing.  Abdominal:     General: Abdomen is flat. Bowel sounds are normal.     Palpations: Abdomen is soft.     Tenderness: There is no abdominal tenderness.  Musculoskeletal:     Cervical back: Normal range of motion and neck supple. No tenderness.  Skin:    General: Skin is warm and dry.     Capillary Refill: Capillary refill takes less than 2 seconds.  Neurological:     General: No focal deficit present.     Mental Status: He is alert and oriented to person, place, and time.     GCS: GCS eye subscore is 4. GCS verbal subscore is 5. GCS motor subscore is 6.     Cranial Nerves: Cranial nerves 2-12 are intact. No cranial nerve deficit.     Sensory: Sensation is intact. No sensory deficit.     Motor: Motor function is intact. No weakness.     Coordination: Coordination is intact. Heel to Shin Test normal.     ED Results / Procedures / Treatments   Labs (all labs ordered are listed, but only abnormal results are displayed) Labs Reviewed  BASIC METABOLIC PANEL - Abnormal; Notable for the following components:      Result Value   Glucose, Bld 105 (*)    All other components within normal limits  PROTIME-INR - Abnormal; Notable for the following  components:   Prothrombin Time 16.8 (*)    INR 1.4 (*)    All other components within normal limits  CBC  D-DIMER, QUANTITATIVE  TROPONIN I (HIGH SENSITIVITY)    EKG None  Radiology DG Chest 2 View  Result Date: 09/30/2021 CLINICAL DATA:  Acute chest pain EXAM: CHEST - 2 VIEW COMPARISON:  07/14/2019 and prior radiographs FINDINGS: The cardiomediastinal silhouette is unchanged. Median sternotomy again noted. There is no evidence of focal airspace disease, pulmonary edema, suspicious pulmonary nodule/mass, pleural effusion, or pneumothorax. No acute bony abnormalities are identified. IMPRESSION: No active cardiopulmonary disease. Electronically  Signed   By: Harmon Pier M.D.   On: 09/30/2021 14:47    Procedures Procedures   Medications Ordered in ED Medications - No data to display  ED Course/ Medical Decision Making/ A&P                           Medical Decision Making Amount and/or Complexity of Data Reviewed Labs: ordered. Radiology: ordered.   76 year old presents to the ED for evaluation.  Please see HPI for further details.  On examination patient is in no apparent distress.  The patient is resting comfortably in bed.  Patient is afebrile and nontachycardic.  The patient lung sounds are clear bilaterally, he is not hypoxic.  The patient abdomen is soft and compressible throughout.  There is no peripheral edema.  Patient is nontoxic in appearance.  Due to patient chest pain we will start the patient work-up utilizing troponin, BMP, CBC.  We will order PT/INR studies because the patient is currently anticoagulated.  We will also order D-dimer as the patient states he has recently traveled however he is on a blood thinning medication.  Patient CBC unremarkable, no leukocytosis.  The patient BMP is unremarkable.  The patient initial troponin is 5, delta troponin is pending.  The D-dimer is pending.  The patient EKG shows atrial flutter however the patient has a diagnosis of  atrial flutter.  The patient is hemodynamically stable with a pulse rate of 77.  Patient chest x-ray shows no consolidation, effusion, cardiomegaly.  Patient signed out to Riki Sheer, PA-C pending second troponin.  All pertinent physical exam details as well as plan of management discussed with John.   Final Clinical Impression(s) / ED Diagnoses Final diagnoses:  Chest pain, unspecified type    Rx / DC Orders ED Discharge Orders     None         Al Decant, PA-C 09/30/21 1514    Alvira Monday, MD 10/02/21 1047

## 2021-09-30 NOTE — ED Triage Notes (Signed)
"  Chest pain in left side of chest that comes and goes since this morning, if I take a deep breath it gets a little worse" per pt Denies shortness of breath, denies nausea or any other symptoms.

## 2021-09-30 NOTE — Discharge Instructions (Addendum)
Diagnosed with unspecified chest pain.  Work-up was overall reassuring.  No elevation in her troponins.  EKG did reveal that you are in atrial flutter but you are actively managed with cardiology.  Recommend that you follow-up with your cardiologist regarding the work-up that we did today.  If you have new shortness of breath, worsening chest pain, nausea and sweating sweating or syncopal episode (pass out).  Please return to the emergency department for further evaluation.

## 2021-10-31 DIAGNOSIS — H40053 Ocular hypertension, bilateral: Secondary | ICD-10-CM | POA: Diagnosis not present

## 2021-11-07 ENCOUNTER — Other Ambulatory Visit: Payer: Self-pay | Admitting: Internal Medicine

## 2021-11-07 DIAGNOSIS — F5104 Psychophysiologic insomnia: Secondary | ICD-10-CM

## 2021-11-29 ENCOUNTER — Other Ambulatory Visit: Payer: Self-pay | Admitting: Cardiovascular Disease

## 2021-11-29 DIAGNOSIS — I480 Paroxysmal atrial fibrillation: Secondary | ICD-10-CM

## 2021-11-30 NOTE — Telephone Encounter (Signed)
Prescription refill request for Eliquis received. Indication: Aflutter Last office visit: 10/17/20 [facility]) Scr: 0.99 (09/30/21)  Age: 76 Weight: 72.6kg  Pt overdue to see cardiologist. Message sent to schedulers. Refill sent to last until appt scheduled.

## 2022-01-05 ENCOUNTER — Other Ambulatory Visit: Payer: Self-pay | Admitting: Internal Medicine

## 2022-01-05 DIAGNOSIS — F5104 Psychophysiologic insomnia: Secondary | ICD-10-CM

## 2022-01-29 ENCOUNTER — Ambulatory Visit (INDEPENDENT_AMBULATORY_CARE_PROVIDER_SITE_OTHER): Payer: Medicare PPO | Admitting: Internal Medicine

## 2022-01-29 ENCOUNTER — Encounter: Payer: Self-pay | Admitting: Internal Medicine

## 2022-01-29 VITALS — BP 110/76 | HR 70 | Temp 97.7°F | Ht 70.0 in | Wt 160.0 lb

## 2022-01-29 DIAGNOSIS — R7309 Other abnormal glucose: Secondary | ICD-10-CM

## 2022-01-29 DIAGNOSIS — Z0000 Encounter for general adult medical examination without abnormal findings: Secondary | ICD-10-CM

## 2022-01-29 DIAGNOSIS — Z6822 Body mass index (BMI) 22.0-22.9, adult: Secondary | ICD-10-CM | POA: Diagnosis not present

## 2022-01-29 DIAGNOSIS — E7800 Pure hypercholesterolemia, unspecified: Secondary | ICD-10-CM | POA: Diagnosis not present

## 2022-01-29 DIAGNOSIS — Z2821 Immunization not carried out because of patient refusal: Secondary | ICD-10-CM | POA: Diagnosis not present

## 2022-01-29 DIAGNOSIS — I700 Atherosclerosis of aorta: Secondary | ICD-10-CM

## 2022-01-29 DIAGNOSIS — I480 Paroxysmal atrial fibrillation: Secondary | ICD-10-CM

## 2022-01-29 DIAGNOSIS — D6869 Other thrombophilia: Secondary | ICD-10-CM | POA: Diagnosis not present

## 2022-01-29 LAB — POC HEMOCCULT BLD/STL (OFFICE/1-CARD/DIAGNOSTIC): Fecal Occult Blood, POC: NEGATIVE

## 2022-01-29 NOTE — Progress Notes (Signed)
Jeri Cos [place],acting as a Neurosurgeon for [name], MD.,have documented all relevant documentation on the behalf of [name], MD,as directed by  [name], MD while in the presence of [name], MD.   Subjective:     Patient ID: Carlos [name] , male    DOB: 1945/12/13 , 77 y.o.   MRN: [id number]   Chief Complaint  Patient presents with   Annual Exam    HPI  He is here today for a full physical examination.   He reports compliance with meds. He denies having any chest pain, shortness of breath and palpitations. He states he did eat breakfast this morning.      Past Medical History:  Diagnosis Date   Allergic rhinitis    Atypical chest pain 09/16/2015   Dysrhythmia    a-fib/flutter   Elevated blood pressure 10/19/2015   GERD (gastroesophageal reflux disease)    Hypertension    Insomnia    Left inguinal hernia    Mixed hyperlipidemia    Pure hypercholesterolemia 10/02/2018   PVC (premature ventricular contraction) 10/19/2015   Typical atrial flutter (HCC) 10/02/2018     Family History  Problem Relation Age of Onset   Healthy Mother    Heart attack Father    Pulmonary embolism Brother      Current Outpatient Medications:    apixaban (ELIQUIS) 5 MG TABS tablet, Take 1 tablet (5 mg total) by mouth 2 (two) times daily. MUST SCHEDULE APPT WITH CARDIOLOGIST FOR FUTURE REFILLS., Disp: 180 tablet, Rfl: 0   CALCIUM PO, Take 1 tablet by mouth daily. 600 mg daily, Disp: , Rfl:    Cholecalciferol (VITAMIN D PO), Take 1 capsule by mouth daily. , Disp: , Rfl:    MAGNESIUM PO, Take by mouth., Disp: , Rfl:    metoprolol tartrate (LOPRESSOR) 50 MG tablet, Take 1 tablet (50 mg total) by mouth 2 (two) times daily., Disp: 180 tablet, Rfl: 1   Multiple Vitamin (MULTIVITAMIN) capsule, Take 1 capsule by mouth daily., Disp: , Rfl:    Omega-3 Fatty Acids (FISH OIL PO), Take 1 capsule by mouth. EVERY OTHER DAY, Disp: , Rfl:    pantoprazole (PROTONIX) 40 MG tablet,  Take 1 tablet (40 mg total) by mouth 2 (two) times daily., Disp: 180 tablet, Rfl: 3   rosuvastatin (CRESTOR) 10 MG tablet, Take 1 tablet (10 mg total) by mouth daily., Disp: 90 tablet, Rfl: 2   zolpidem (AMBIEN) 10 MG tablet, TAKE 1 TABLET BY MOUTH AT BEDTIME AS NEEDED, Disp: 30 tablet, Rfl: 1   levocetirizine (XYZAL) 5 MG tablet, Take 1 tablet (5 mg total) by mouth every evening. (Patient not taking: Reported on 01/29/2022), Disp: 90 tablet, Rfl: 1   No Known Allergies   Men's preventive visit. Patient Health Questionnaire (PHQ-2) is  Flowsheet Row Office Visit from 08/16/2021 in [facility]  PHQ-2 Total Score 0     . Patient is on a healthy diet. Marital status: Divorced. Relevant history for alcohol use is:  Social History   Substance and Sexual Activity  Alcohol Use Yes   Comment: 10-12 drinks per week  . Relevant history for tobacco use is:  Social History   Tobacco Use  Smoking Status Former   Packs/day: 0.50   Years: 9.00   Total pack years: 4.50   Types: Cigarettes   Quit date: 01/22/2006   Years since quitting: 16.0  Smokeless Tobacco Never  .   Review of Systems  Constitutional:  Negative.   HENT: Negative.    Eyes: Negative.   Respiratory: Negative.    Cardiovascular: Negative.   Gastrointestinal: Negative.   Endocrine: Negative.   Genitourinary: Negative.   Musculoskeletal: Negative.   Skin: Negative.   Neurological: Negative.   Hematological: Negative.   Psychiatric/Behavioral: Negative.       Today's Vitals   01/29/22 1044  BP: 110/76  Pulse: 70  Temp: 97.7 F (36.5 C)  Weight: 160 lb (72.6 kg)  Height: 5\' 10"  (1.778 m)  PainSc: 0-No pain   Body mass index is 22.96 kg/m.  Wt Readings from Last 3 Encounters:  01/29/22 160 lb (72.6 kg)  09/30/21 160 lb (72.6 kg)  09/12/21 159 lb 12.8 oz (72.5 kg)     Objective:  Physical Exam Vitals and nursing note reviewed.  Constitutional:      Appearance: Normal appearance.  HENT:      Head: Normocephalic and atraumatic.     Right Ear: Tympanic membrane, ear canal and external ear normal.     Left Ear: Tympanic membrane, ear canal and external ear normal.     Nose: Nose normal.     Mouth/Throat:     Mouth: Mucous membranes are moist.     Pharynx: Oropharynx is clear.  Eyes:     Extraocular Movements: Extraocular movements intact.     Conjunctiva/sclera: Conjunctivae normal.     Pupils: Pupils are equal, round, and reactive to light.  Cardiovascular:     Rate and Rhythm: Normal rate. Rhythm irregular.     Pulses: Normal pulses.     Heart sounds: Normal heart sounds.  Pulmonary:     Effort: Pulmonary effort is normal.     Breath sounds: Normal breath sounds.  Chest:  Breasts:    Right: Normal. No swelling, bleeding, inverted nipple, mass or nipple discharge.     Left: Normal. No swelling, bleeding, inverted nipple, mass or nipple discharge.  Abdominal:     General: Abdomen is flat. Bowel sounds are normal.     Palpations: Abdomen is soft.  Genitourinary:    Prostate: Normal.     Rectum: Normal. Guaiac result negative.  Musculoskeletal:        General: Normal range of motion.     Cervical back: Normal range of motion and neck supple.  Skin:    General: Skin is warm.  Neurological:     General: No focal deficit present.     Mental Status: He is alert.  Psychiatric:        Mood and Affect: Mood normal.        Behavior: Behavior normal.      Assessment And Plan:    1. Encounter for general adult medical examination w/o abnormal findings Comments: A full exam was performed, including DRE. Stool is heme negative.  PATIENT IS ADVISED TO GET 30-45 MINUTES REGULAR EXERCISE NO LESS THAN FOUR TO FIVE DAYS PER WEEK - BOTH WEIGHTBEARING EXERCISES AND AEROBIC ARE RECOMMENDED.  PATIENT IS ADVISED TO FOLLOW A HEALTHY DIET WITH AT LEAST SIX FRUITS/VEGGIES PER DAY, DECREASE INTAKE OF RED MEAT, AND TO INCREASE FISH INTAKE TO TWO DAYS PER WEEK.  MEATS/FISH SHOULD NOT BE  FRIED, BAKED OR BROILED IS PREFERABLE.  IT IS ALSO IMPORTANT TO CUT BACK ON YOUR SUGAR INTAKE. PLEASE AVOID ANYTHING WITH ADDED SUGAR, CORN SYRUP OR OTHER SWEETENERS. IF YOU MUST USE A SWEETENER, YOU CAN TRY STEVIA. IT IS ALSO IMPORTANT TO AVOID ARTIFICIALLY SWEETENERS AND DIET BEVERAGES. LASTLY, I SUGGEST WEARING SPF  50 SUNSCREEN ON EXPOSED PARTS AND ESPECIALLY WHEN IN THE DIRECT SUNLIGHT FOR AN EXTENDED PERIOD OF TIME.  PLEASE AVOID FAST FOOD RESTAURANTS AND INCREASE YOUR WATER INTAKE. - POC Hemoccult Bld/Stl (1-Cd Office Dx)  2. Pure hypercholesterolemia Comments: Chronic, LDL goal <70, due to aortic atherosclerosis. He will c/w rosuvastatin 10mg  daily for now. Advised to follow heart healthy diet. - Lipid panel - CBC no Diff - TSH - CMP14+EGFR  3. PAF (paroxysmal atrial fibrillation) (HCC) Comments: Chronic, EKG performed, undetermined rhythm, first degree AV block, frequent PACs, atrial bigeminy. I will forward to his cardiologist. He is rate controlled. - EKG 12-Lead  4. Other abnormal glucose Comments: His a1c has been elevated in the past. I will recheck this today. He is encouraged to decrease his intake of sugary beverages. - Hemoglobin A1c  5. Acquired thrombophilia (HCC) Comments: He is currently on Eliquis due to PAF.  6. BMI 22.0-22.9, adult Comments: He is encouraged to aim for at least 150 minutes of exercise per week.  7. Aortic atherosclerosis (HCC) LDL goal <70, he will continue with rosuvastatin 10mg  daily for now. He is encouraged to follow heart healthy lifestyle.   8. Herpes zoster vaccination declined  9. Influenza vaccination declined   Patient was given opportunity to ask questions. Patient verbalized understanding of the plan and was able to repeat key elements of the plan. All questions were answered to their satisfaction.   I, 09-11-1983, MD, have reviewed all documentation for this visit. The documentation on 01/29/22 for the exam, diagnosis,  procedures, and orders are all accurate and complete.   THE PATIENT IS ENCOURAGED TO PRACTICE SOCIAL DISTANCING DUE TO THE COVID-19 PANDEMIC.

## 2022-01-29 NOTE — Patient Instructions (Signed)
Health Maintenance, Male Adopting a healthy lifestyle and getting preventive care are important in promoting health and wellness. Ask your health care provider about: The right schedule for you to have regular tests and exams. Things you can do on your own to prevent diseases and keep yourself healthy. What should I know about diet, weight, and exercise? Eat a healthy diet  Eat a diet that includes plenty of vegetables, fruits, low-fat dairy products, and lean protein. Do not eat a lot of foods that are high in solid fats, added sugars, or sodium. Maintain a healthy weight Body mass index (BMI) is a measurement that can be used to identify possible weight problems. It estimates body fat based on height and weight. Your health care provider can help determine your BMI and help you achieve or maintain a healthy weight. Get regular exercise Get regular exercise. This is one of the most important things you can do for your health. Most adults should: Exercise for at least 150 minutes each week. The exercise should increase your heart rate and make you sweat (moderate-intensity exercise). Do strengthening exercises at least twice a week. This is in addition to the moderate-intensity exercise. Spend less time sitting. Even light physical activity can be beneficial. Watch cholesterol and blood lipids Have your blood tested for lipids and cholesterol at 77 years of age, then have this test every 5 years. You may need to have your cholesterol levels checked more often if: Your lipid or cholesterol levels are high. You are older than 77 years of age. You are at high risk for heart disease. What should I know about cancer screening? Many types of cancers can be detected early and may often be prevented. Depending on your health history and family history, you may need to have cancer screening at various ages. This may include screening for: Colorectal cancer. Prostate cancer. Skin cancer. Lung  cancer. What should I know about heart disease, diabetes, and high blood pressure? Blood pressure and heart disease High blood pressure causes heart disease and increases the risk of stroke. This is more likely to develop in people who have high blood pressure readings or are overweight. Talk with your health care provider about your target blood pressure readings. Have your blood pressure checked: Every 3-5 years if you are 18-39 years of age. Every year if you are 40 years old or older. If you are between the ages of 65 and 75 and are a current or former smoker, ask your health care provider if you should have a one-time screening for abdominal aortic aneurysm (AAA). Diabetes Have regular diabetes screenings. This checks your fasting blood sugar level. Have the screening done: Once every three years after age 45 if you are at a normal weight and have a low risk for diabetes. More often and at a younger age if you are overweight or have a high risk for diabetes. What should I know about preventing infection? Hepatitis B If you have a higher risk for hepatitis B, you should be screened for this virus. Talk with your health care provider to find out if you are at risk for hepatitis B infection. Hepatitis C Blood testing is recommended for: Everyone born from 1945 through 1965. Anyone with known risk factors for hepatitis C. Sexually transmitted infections (STIs) You should be screened each year for STIs, including gonorrhea and chlamydia, if: You are sexually active and are younger than 77 years of age. You are older than 77 years of age and your   health care provider tells you that you are at risk for this type of infection. Your sexual activity has changed since you were last screened, and you are at increased risk for chlamydia or gonorrhea. Ask your health care provider if you are at risk. Ask your health care provider about whether you are at high risk for HIV. Your health care provider  may recommend a prescription medicine to help prevent HIV infection. If you choose to take medicine to prevent HIV, you should first get tested for HIV. You should then be tested every 3 months for as long as you are taking the medicine. Follow these instructions at home: Alcohol use Do not drink alcohol if your health care provider tells you not to drink. If you drink alcohol: Limit how much you have to 0-2 drinks a day. Know how much alcohol is in your drink. In the [place] one drink equals one 12 oz bottle of beer (355 mL), one 5 oz glass of wine (148 mL), or one 1 oz glass of hard liquor (44 mL). Lifestyle Do not use any products that contain nicotine or tobacco. These products include cigarettes, chewing tobacco, and vaping devices, such as e-cigarettes. If you need help quitting, ask your health care provider. Do not use street drugs. Do not share needles. Ask your health care provider for help if you need support or information about quitting drugs. General instructions Schedule regular health, dental, and eye exams. Stay current with your vaccines. Tell your health care provider if: You often feel depressed. You have ever been abused or do not feel safe at home. Summary Adopting a healthy lifestyle and getting preventive care are important in promoting health and wellness. Follow your health care provider's instructions about healthy diet, exercising, and getting tested or screened for diseases. Follow your health care provider's instructions on monitoring your cholesterol and blood pressure. This information is not intended to replace advice given to you by your health care provider. Make sure you discuss any questions you have with your health care provider. Document Revised: 05/30/2020 Document Reviewed: 05/30/2020 Elsevier Patient Education  2023 [employer].  

## 2022-01-30 LAB — LIPID PANEL
Chol/HDL Ratio: 3.6 ratio (ref 0.0–5.0)
Cholesterol, Total: 150 mg/dL (ref 100–199)
HDL: 42 mg/dL (ref >39)
LDL Chol Calc (NIH): 76 mg/dL (ref 0–99)
Triglycerides: 187 mg/dL — ABNORMAL HIGH (ref 0–149)
VLDL Cholesterol Cal: 32 mg/dL (ref 5–40)

## 2022-01-30 LAB — CMP14+EGFR
ALT: 24 IU/L (ref 0–44)
AST: 19 IU/L (ref 0–40)
Albumin/Globulin Ratio: 1.5 (ref 1.2–2.2)
Albumin: 4.4 g/dL (ref 3.8–4.8)
Alkaline Phosphatase: 86 IU/L (ref 44–121)
BUN/Creatinine Ratio: 16 (ref 10–24)
BUN: 16 mg/dL (ref 8–27)
Bilirubin Total: 0.7 mg/dL (ref 0.0–1.2)
CO2: 27 mmol/L (ref 20–29)
Calcium: 10.1 mg/dL (ref 8.6–10.2)
Chloride: 100 mmol/L (ref 96–106)
Creatinine, Ser: 0.98 mg/dL (ref 0.76–1.27)
Globulin, Total: 3 g/dL (ref 1.5–4.5)
Glucose: 111 mg/dL — ABNORMAL HIGH (ref 70–99)
Potassium: 5 mmol/L (ref 3.5–5.2)
Sodium: 140 mmol/L (ref 134–144)
Total Protein: 7.4 g/dL (ref 6.0–8.5)
eGFR: 80 mL/min/{1.73_m2} (ref >59)

## 2022-01-30 LAB — CBC
Hematocrit: 48.4 % (ref 37.5–51.0)
Hemoglobin: 15.9 g/dL (ref 13.0–17.7)
MCH: 28.9 pg (ref 26.6–33.0)
MCHC: 32.9 g/dL (ref 31.5–35.7)
MCV: 88 fL (ref 79–97)
Platelets: 371 10*3/uL (ref 150–450)
RBC: 5.5 x10E6/uL (ref 4.14–5.80)
RDW: 13.1 % (ref 11.6–15.4)
WBC: 6.9 10*3/uL (ref 3.4–10.8)

## 2022-01-30 LAB — HEMOGLOBIN A1C
Est. average glucose Bld gHb Est-mCnc: 154 mg/dL
Hgb A1c MFr Bld: 7 % — ABNORMAL HIGH (ref 4.8–5.6)

## 2022-01-30 LAB — TSH: TSH: 1.68 u[IU]/mL (ref 0.450–4.500)

## 2022-01-31 ENCOUNTER — Encounter: Payer: Self-pay | Admitting: Internal Medicine

## 2022-02-04 DIAGNOSIS — Z6822 Body mass index (BMI) 22.0-22.9, adult: Secondary | ICD-10-CM | POA: Insufficient documentation

## 2022-02-04 DIAGNOSIS — R7309 Other abnormal glucose: Secondary | ICD-10-CM | POA: Insufficient documentation

## 2022-02-28 ENCOUNTER — Other Ambulatory Visit: Payer: Self-pay | Admitting: Cardiovascular Disease

## 2022-02-28 ENCOUNTER — Other Ambulatory Visit: Payer: Self-pay | Admitting: Internal Medicine

## 2022-02-28 DIAGNOSIS — F5104 Psychophysiologic insomnia: Secondary | ICD-10-CM

## 2022-02-28 DIAGNOSIS — I480 Paroxysmal atrial fibrillation: Secondary | ICD-10-CM

## 2022-03-01 NOTE — Telephone Encounter (Addendum)
Eliquis 5mg  refill request received. Patient is 77 years old, weight-72.6kg, Crea- 0.98 on 01/29/22, Diagnosis-Aflutter, and last seen by Dr. Oval Linsey on 10/17/20 & has an appt pending on 06/08/22. Dose is appropriate based on dosing criteria.

## 2022-03-01 NOTE — Telephone Encounter (Signed)
Please review for refill. Thank you! 

## 2022-03-03 ENCOUNTER — Other Ambulatory Visit: Payer: Self-pay | Admitting: Internal Medicine

## 2022-03-03 DIAGNOSIS — F5104 Psychophysiologic insomnia: Secondary | ICD-10-CM

## 2022-04-26 ENCOUNTER — Encounter: Payer: Self-pay | Admitting: Internal Medicine

## 2022-04-26 ENCOUNTER — Ambulatory Visit (INDEPENDENT_AMBULATORY_CARE_PROVIDER_SITE_OTHER): Payer: Medicare PPO

## 2022-04-26 ENCOUNTER — Ambulatory Visit (INDEPENDENT_AMBULATORY_CARE_PROVIDER_SITE_OTHER): Payer: Medicare PPO | Admitting: Internal Medicine

## 2022-04-26 VITALS — BP 110/60 | HR 74 | Temp 97.8°F | Ht 70.0 in | Wt 161.6 lb

## 2022-04-26 VITALS — BP 110/60 | HR 74 | Temp 97.8°F | Ht 70.0 in | Wt 161.0 lb

## 2022-04-26 DIAGNOSIS — M109 Gout, unspecified: Secondary | ICD-10-CM

## 2022-04-26 DIAGNOSIS — E785 Hyperlipidemia, unspecified: Secondary | ICD-10-CM

## 2022-04-26 DIAGNOSIS — Z0000 Encounter for general adult medical examination without abnormal findings: Secondary | ICD-10-CM

## 2022-04-26 DIAGNOSIS — E1169 Type 2 diabetes mellitus with other specified complication: Secondary | ICD-10-CM | POA: Diagnosis not present

## 2022-04-26 LAB — POCT URINALYSIS DIPSTICK
Bilirubin, UA: NEGATIVE
Blood, UA: NEGATIVE
Glucose, UA: NEGATIVE
Ketones, UA: NEGATIVE
Leukocytes, UA: NEGATIVE
Nitrite, UA: NEGATIVE
Protein, UA: NEGATIVE
Spec Grav, UA: 1.025 (ref 1.010–1.025)
Urobilinogen, UA: 0.2 E.U./dL
pH, UA: 6 (ref 5.0–8.0)

## 2022-04-26 MED ORDER — COLCHICINE 0.6 MG PO TABS: ORAL_TABLET | ORAL | 2 refills | Status: DC

## 2022-04-26 MED ORDER — METOPROLOL TARTRATE 50 MG PO TABS: 50.0000 mg | ORAL_TABLET | Freq: Two times a day (BID) | ORAL | 1 refills | Status: DC

## 2022-04-26 MED ORDER — ROSUVASTATIN CALCIUM 10 MG PO TABS: 10.0000 mg | ORAL_TABLET | Freq: Every day | ORAL | 2 refills | Status: DC

## 2022-04-26 MED ORDER — PANTOPRAZOLE SODIUM 40 MG PO TBEC: 40.0000 mg | DELAYED_RELEASE_TABLET | Freq: Two times a day (BID) | ORAL | 3 refills | Status: DC

## 2022-04-26 MED ORDER — LEVOCETIRIZINE DIHYDROCHLORIDE 5 MG PO TABS
5.0000 mg | ORAL_TABLET | Freq: Every evening | ORAL | 1 refills | Status: DC
Start: 2022-04-26 — End: 2022-11-01

## 2022-04-26 NOTE — Patient Instructions (Signed)
Low-Purine Eating Plan A low-purine eating plan involves making food choices to limit your purine intake. Purine is a kind of uric acid. Too much uric acid in your blood can cause certain conditions, such as gout and kidney stones. Eating a low-purine diet may help control these conditions. What are tips for following this plan? Shopping Avoid buying products that contain high-fructose corn syrup. Check for this on food labels. It is commonly found in many processed foods and soft drinks. Be sure to check for it in baked goods such as cookies, canned fruits, and cereals and cereal bars. Avoid buying veal, chicken breast with skin, lamb, and organ meats such as liver. These types of meats tend to have the highest purine content. Choose dairy products. These may lower uric acid levels. Avoid certain types of fish. Not all fish and seafood have high purine content. Examples with high purine content include anchovies, trout, tuna, sardines, and salmon. Avoid buying beverages that contain alcohol, particularly beer and hard liquor. Alcohol can affect the way your body gets rid of uric acid. Meal planning  Learn which foods do or do not affect you. If you find out that a food tends to cause your gout symptoms to flare up, avoid eating that food. You can enjoy foods that do not cause problems. If you have any questions about a food item, talk with your dietitian or health care provider. Reduce the overall amount of meat in your diet. When you do eat meat, choose ones with lower purine content. Include plenty of fruits and vegetables. Although some vegetables may have a high purine content--such as asparagus, mushrooms, spinach, or cauliflower--it has been shown that these do not contribute to uric acid blood levels as much. Consume at least 1 dairy serving a day. This has been shown to decrease uric acid levels. General information If you drink alcohol: Limit how much you have to: 0-1 drink a day for  women who are not pregnant. 0-2 drinks a day for men. Know how much alcohol is in a drink. In the [place] one drink equals one 12 oz bottle of beer (355 mL), one 5 oz glass of wine (148 mL), or one 1 oz glass of hard liquor (44 mL). Drink plenty of water. Try to drink enough to keep your urine pale yellow. Fluids can help remove uric acid from your body. Work with your health care provider and dietitian to develop a plan to achieve or maintain a healthy weight. Losing weight may help reduce uric acid in your blood. What foods are recommended? The following are some types of foods that are good choices when limiting purine intake: Fresh or frozen fruits and vegetables. Whole grains, breads, cereals, and pasta. Rice. Beans, peas, legumes. Nuts and seeds. Dairy products. Fats and oils. The items listed above may not be a complete list. Talk with a dietitian about what dietary choices are best for you. What foods are not recommended? Limit your intake of foods high in purines, including: Beer and other alcohol. Meat-based gravy or sauce. Canned or fresh fish, such as: Anchovies, sardines, herring, salmon, and tuna. Mussels and scallops. Codfish, trout, and haddock. Bacon, veal, chicken breast with skin, and lamb. Organ meats, such as: Liver or kidney. Tripe. Sweetbreads (thymus gland or pancreas). Wild game or goose. Yeast or yeast extract supplements. Drinks sweetened with high-fructose corn syrup, such as soda. Processed foods made with high-fructose corn syrup. The items listed above may not be a complete list of foods   and beverages you should limit. Contact a dietitian for more information. Summary Eating a low-purine diet may help control conditions caused by too much uric acid in the body, such as gout or kidney stones. Choose low-purine foods, limit alcohol, and limit high-fructose corn syrup. You will learn over time which foods do or do not affect you. If you find out that a  food tends to cause your gout symptoms to flare up, avoid eating that food. This information is not intended to replace advice given to you by your health care provider. Make sure you discuss any questions you have with your health care provider. Document Revised: 12/22/2020 Document Reviewed: 12/22/2020 Elsevier Patient Education  2023 Elsevier Inc.  

## 2022-04-26 NOTE — Progress Notes (Addendum)
Hershal Coria Martin,acting as a Neurosurgeon for [name], MD.,have documented all relevant documentation on the behalf of [name], MD,as directed by  [name], MD while in the presence of [name], MD.    Subjective:     Patient ID: Carlos [name] , male    DOB: 09-22-1945 , 77 y.o.   MRN: [id number]   Chief Complaint  Patient presents with   Hyperlipidemia    HPI  Pt presents today for chol f/u. He denies chest pain, SOB, headache. Patient would like to discuss his gout issues.   He also has AWV scheduled with [facility] Advisor.   BP Readings from Last 3 Encounters: 04/26/22 : 110/60 04/26/22 : 110/60 01/29/22 : 110/76    Hyperlipidemia This is a chronic problem. The current episode started more than 1 year ago. Exacerbating diseases include diabetes. He has no history of chronic renal disease. Current antihyperlipidemic treatment includes statins. The current treatment provides moderate improvement of lipids. Risk factors for coronary artery disease include dyslipidemia and male sex.     Past Medical History:  Diagnosis Date   Allergic rhinitis    Atypical chest pain 09/16/2015   Dysrhythmia    a-fib/flutter   Elevated blood pressure 10/19/2015   GERD (gastroesophageal reflux disease)    Hypertension    Insomnia    Left inguinal hernia    Mixed hyperlipidemia    Pure hypercholesterolemia 10/02/2018   PVC (premature ventricular contraction) 10/19/2015   Typical atrial flutter 10/02/2018     Family History  Problem Relation Age of Onset   Healthy Mother    Heart attack Father    Pulmonary embolism Brother      Current Outpatient Medications:    apixaban (ELIQUIS) 5 MG TABS tablet, Take 1 tablet by mouth twice daily, Disp: 180 tablet, Rfl: 0   CALCIUM PO, Take 1 tablet by mouth daily. 600 mg daily, Disp: , Rfl:    Cholecalciferol (VITAMIN D PO), Take 1 capsule by mouth daily. , Disp: , Rfl:    colchicine 0.6 MG tablet, One tab po twice daily as  needed for gout, do not take for more than 5 days at a time, Disp: 30 tablet, Rfl: 2   MAGNESIUM PO, Take by mouth., Disp: , Rfl:    Multiple Vitamin (MULTIVITAMIN) capsule, Take 1 capsule by mouth daily., Disp: , Rfl:    Omega-3 Fatty Acids (FISH OIL PO), Take 1 capsule by mouth. EVERY OTHER DAY, Disp: , Rfl:    zolpidem (AMBIEN) 10 MG tablet, TAKE 1 TABLET BY MOUTH AT BEDTIME AS NEEDED, Disp: 30 tablet, Rfl: 2   levocetirizine (XYZAL) 5 MG tablet, Take 1 tablet (5 mg total) by mouth every evening., Disp: 90 tablet, Rfl: 1   metoprolol tartrate (LOPRESSOR) 50 MG tablet, Take 1 tablet (50 mg total) by mouth 2 (two) times daily., Disp: 180 tablet, Rfl: 1   pantoprazole (PROTONIX) 40 MG tablet, Take 1 tablet (40 mg total) by mouth 2 (two) times daily., Disp: 180 tablet, Rfl: 3   rosuvastatin (CRESTOR) 10 MG tablet, Take 1 tablet (10 mg total) by mouth daily., Disp: 90 tablet, Rfl: 2   No Known Allergies   Review of Systems  Constitutional: Negative.   Respiratory: Negative.    Cardiovascular: Negative.   Gastrointestinal: Negative.   Neurological: Negative.   Psychiatric/Behavioral: Negative.       Today's Vitals   04/26/22 1008  BP: 110/60  Pulse: 74  Temp: 97.8 F (36.6  C)  TempSrc: Oral  Weight: 161 lb (73 kg)  Height: 5\' 10"  (1.778 m)   Body mass index is 23.1 kg/m.  Wt Readings from Last 3 Encounters:  04/26/22 161 lb (73 kg)  04/26/22 161 lb 9.6 oz (73.3 kg)  01/29/22 160 lb (72.6 kg)    Objective:  Physical Exam Vitals and nursing note reviewed.  Constitutional:      Appearance: Normal appearance.  HENT:     Head: Normocephalic and atraumatic.     Nose:     Comments: Masked     Mouth/Throat:     Comments: Masked  Eyes:     Extraocular Movements: Extraocular movements intact.  Cardiovascular:     Rate and Rhythm: Normal rate and regular rhythm.     Heart sounds: Normal heart sounds.  Pulmonary:     Effort: Pulmonary effort is normal.     Breath sounds:  Normal breath sounds.  Musculoskeletal:     Cervical back: Normal range of motion.  Feet:     Left foot:     Skin integrity: Dry skin present.     Comments: No overlying erythema, warmth of left foot. No vesicular lesions noted. Swelling noted Skin:    General: Skin is warm.  Neurological:     General: No focal deficit present.     Mental Status: He is alert.  Psychiatric:        Mood and Affect: Mood normal.         Assessment And Plan:     1. Dyslipidemia associated with type 2 diabetes mellitus Comments: His last a1c was 7.0, will recheck today. He agrees to DM nutrition evaluation/teaching. He does not wish to  start DM meds at this time. Goal LDL < 70.  He will c/w rosuvastatin.  Risks associated w/ untreated DM - Hemoglobin A1c - BMP8+eGFR - Microalbumin / Creatinine Urine Ratio - POCT Urinalysis Dipstick (67124) - Referral to Nutrition and Diabetes Services  2. Podagra Comments: I will check uric acid level today. I will recheck in four weeks. He was given rx colchicine to take for next 2-3 days prn. - Uric acid   Patient was given opportunity to ask questions. Patient verbalized understanding of the plan and was able to repeat key elements of the plan. All questions were answered to their satisfaction.   I, [name], MD, have reviewed all documentation for this visit. The documentation on 04/26/22 for the exam, diagnosis, procedures, and orders are all accurate and complete.   IF YOU HAVE BEEN REFERRED TO A SPECIALIST, IT MAY TAKE 1-2 WEEKS TO SCHEDULE/PROCESS THE REFERRAL. IF YOU HAVE NOT HEARD FROM US/SPECIALIST IN TWO WEEKS, PLEASE GIVE [place] A CALL AT [phone].   THE PATIENT IS ENCOURAGED TO PRACTICE SOCIAL DISTANCING DUE TO THE COVID-19 PANDEMIC.

## 2022-04-26 NOTE — Progress Notes (Signed)
Subjective:   Carlos [name] is a 77 y.o. male who presents for Medicare Annual/Subsequent preventive examination.  Review of Systems     Cardiac Risk Factors include: advanced age (>59men, >53 women);male gender     Objective:    Today's Vitals   04/26/22 0946 04/26/22 0954  BP: 110/60   Pulse: 74   Temp: 97.8 F (36.6 C)   TempSrc: Oral   SpO2: 96%   Weight: 161 lb 9.6 oz (73.3 kg)   Height: 5\' 10"  (1.778 m)   PainSc:  4    Body mass index is 23.19 kg/m.     04/26/2022    9:58 AM 09/30/2021    1:50 PM 04/06/2021    2:05 PM 06/02/2020   10:43 AM 03/23/2020   12:19 PM 07/14/2019    2:47 PM 02/19/2019    2:19 PM  Advanced Directives  Does Patient Have a Medical Advance Directive? Yes No Yes No Yes No Yes  Type of Paramedic of [place];Living will  [place];Living will  [place];Living will  [place];Living will  Copy of [place] in Chart? No - copy requested  No - copy requested  No - copy requested  No - copy requested  Would patient like information on creating a medical advance directive?    No - Patient declined       Current Medications (verified) Outpatient Encounter Medications as of 04/26/2022  Medication Sig   apixaban (ELIQUIS) 5 MG TABS tablet Take 1 tablet by mouth twice daily   CALCIUM PO Take 1 tablet by mouth daily. 600 mg daily   Cholecalciferol (VITAMIN D PO) Take 1 capsule by mouth daily.    levocetirizine (XYZAL) 5 MG tablet Take 1 tablet (5 mg total) by mouth every evening.   MAGNESIUM PO Take by mouth.   metoprolol tartrate (LOPRESSOR) 50 MG tablet Take 1 tablet (50 mg total) by mouth 2 (two) times daily.   Multiple Vitamin (MULTIVITAMIN) capsule Take 1 capsule by mouth daily.   Omega-3 Fatty Acids (FISH OIL PO) Take 1 capsule by mouth. EVERY OTHER DAY   pantoprazole (PROTONIX) 40 MG tablet Take 1 tablet (40 mg total) by mouth 2 (two) times daily.    rosuvastatin (CRESTOR) 10 MG tablet Take 1 tablet (10 mg total) by mouth daily.   zolpidem (AMBIEN) 10 MG tablet TAKE 1 TABLET BY MOUTH AT BEDTIME AS NEEDED   No facility-administered encounter medications on file as of 04/26/2022.    Allergies (verified) Patient has no known allergies.   History: Past Medical History:  Diagnosis Date   Allergic rhinitis    Atypical chest pain 09/16/2015   Dysrhythmia    a-fib/flutter   Elevated blood pressure 10/19/2015   GERD (gastroesophageal reflux disease)    Hypertension    Insomnia    Left inguinal hernia    Mixed hyperlipidemia    Pure hypercholesterolemia 10/02/2018   PVC (premature ventricular contraction) 10/19/2015   Typical atrial flutter 10/02/2018   Past Surgical History:  Procedure Laterality Date   ASD [place] Left 09/03/2018   Procedure: LEFT INGUINAL HERNIA REPAIR WITH MESH;  Surgeon: Donnie Mesa, MD;  Location: [facility];  Service: General;  Laterality: Left;   Family History  Problem Relation Age of Onset   Healthy Mother    Heart attack Father    Pulmonary  embolism Brother    Social History   Socioeconomic History   Marital status: Divorced    Spouse name: Not on file   Number of children: Not on file   Years of education: Not on file   Highest education level: Not on file  Occupational History   Occupation: retired  Tobacco Use   Smoking status: Former    Packs/day: 0.50    Years: 9.00    Additional pack years: 0.00    Total pack years: 4.50    Types: Cigarettes    Quit date: 01/22/2006    Years since quitting: 16.2   Smokeless tobacco: Never  Vaping Use   Vaping Use: Never used  Substance and Sexual Activity   Alcohol use: Yes    Comment: 10-12 drinks per week   Drug use: No   Sexual activity: Not Currently  Other Topics Concern   Not on file  Social History Narrative   Drinks 1-2 cups of caffeine daily.   Social  Determinants of Health   Financial Resource Strain: Low Risk  (04/26/2022)   Overall Financial Resource Strain (CARDIA)    Difficulty of Paying Living Expenses: Not hard at all  Food Insecurity: No Food Insecurity (04/26/2022)   Hunger Vital Sign    Worried About Running Out of Food in the Last Year: Never true    Ran Out of Food in the Last Year: Never true  Transportation Needs: No Transportation Needs (04/26/2022)   PRAPARE - Hydrologist (Medical): No    Lack of Transportation (Non-Medical): No  Physical Activity: Sufficiently Active (04/26/2022)   Exercise Vital Sign    Days of Exercise per Week: 4 days    Minutes of Exercise per Session: 60 min  Stress: No Stress Concern Present (04/26/2022)   [place]    Feeling of Stress : Not at all  Social Connections: Not on file    Tobacco Counseling Counseling given: Not Answered   Clinical Intake:  Pre-visit preparation completed: Yes  Pain : 0-10 Pain Score: 4  Pain Type: Acute pain Pain Location: Toe (Comment which one) Pain Orientation: Left Pain Onset: 1 to 4 weeks ago Pain Frequency: Intermittent     Nutritional Status: BMI of 19-24  Normal Nutritional Risks: None Diabetes: No  How often do you need to have someone help you when you read instructions, pamphlets, or other written materials from your doctor or pharmacy?: 1 - Never  Diabetic? no  Interpreter Needed?: No  Information entered by :: NAllen LPN   Activities of Daily Living    04/26/2022    9:59 AM  In your present state of health, do you have any difficulty performing the following activities:  Hearing? 0  Vision? 0  Difficulty concentrating or making decisions? 0  Walking or climbing stairs? 0  Dressing or bathing? 0  Doing errands, shopping? 0  Preparing Food and eating ? N  Using the Toilet? N  In the past six months, have you accidently leaked urine? N   Do you have problems with loss of bowel control? N  Managing your Medications? N  Managing your Finances? N  Housekeeping or managing your Housekeeping? N    Patient Care Team: Glendale Chard, MD as PCP - General (Internal Medicine) Skeet Latch, MD as PCP - Cardiology (Cardiology)  Indicate any recent Medical Services you may have received from other than [facility] providers in the past year (date  may be approximate).     Assessment:   This is a routine wellness examination for [place].  Hearing/Vision screen Vision Screening - Comments:: Regular eye exams, Dr. Valetta Close, [facility]  Dietary issues and exercise activities discussed: Current Exercise Habits: Home exercise routine, Type of exercise: treadmill;strength training/weights, Time (Minutes): 60, Frequency (Times/Week): 4, Weekly Exercise (Minutes/Week): 240   Goals Addressed             This Visit's Progress    Patient Stated       04/26/2022, no goals       Depression Screen    04/26/2022    9:59 AM 08/16/2021   12:27 PM 04/06/2021    2:06 PM 03/23/2020   12:20 PM 02/19/2019    2:20 PM 12/02/2018    2:45 PM 10/13/2018    2:57 PM  PHQ 2/9 Scores  PHQ - 2 Score 0 0 0 0 0 6 0  PHQ- 9 Score     0 12     Fall Risk    04/26/2022    9:58 AM 08/16/2021   12:27 PM 04/06/2021    2:06 PM 03/23/2020   12:19 PM 02/19/2019    2:20 PM  Fall Risk   Falls in the past year? 0 0 0 0 0  Number falls in past yr: 0 0     Injury with Fall? 0 0     Risk for fall due to : Medication side effect No Fall Risks Medication side effect Medication side effect Medication side effect  Follow up Falls prevention discussed;Education provided;Falls evaluation completed Falls evaluation completed Falls evaluation completed;Education provided;Falls prevention discussed Falls evaluation completed;Education provided;Falls prevention discussed Falls evaluation completed;Education provided;Falls prevention discussed    FALL RISK PREVENTION  PERTAINING TO THE HOME:  Any stairs in or around the home? Yes  If so, are there any without handrails? No  Home free of loose throw rugs in walkways, pet beds, electrical cords, etc? Yes  Adequate lighting in your home to reduce risk of falls? Yes   ASSISTIVE DEVICES UTILIZED TO PREVENT FALLS:  Life alert? No  Use of a cane, walker or w/c? No  Grab bars in the bathroom? No  Shower chair or bench in shower? No  Elevated toilet seat or a handicapped toilet? Yes   TIMED UP AND GO:  Was the test performed? Yes .  Length of time to ambulate 10 feet: 5 sec.   Gait steady and fast without use of assistive device  Cognitive Function:        04/26/2022    9:59 AM 04/06/2021    2:07 PM 03/23/2020   12:21 PM 02/19/2019    2:22 PM 07/24/2018    9:24 AM  6CIT Screen  What Year? 0 points 0 points 0 points 0 points 0 points  What month? 0 points 0 points 0 points 0 points 0 points  What time? 0 points 0 points 0 points 0 points 0 points  Count back from 20 0 points 0 points 0 points 0 points 0 points  Months in reverse 0 points 0 points 0 points 0 points 0 points  Repeat phrase 2 points 0 points 2 points 2 points 0 points  Total Score 2 points 0 points 2 points 2 points 0 points    Immunizations Immunization History  Administered Date(s) Administered   PFIZER(Purple Top)SARS-COV-2 Vaccination 03/08/2019, 03/31/2019, 01/27/2020, 04/29/2020   Pfizer Covid-19 Vaccine Bivalent Booster 23yrs & up 11/01/2020   Tdap 02/01/2018  Unspecified SARS-COV-2 Vaccination 09/06/2019    TDAP status: Due, Education has been provided regarding the importance of this vaccine. Advised may receive this vaccine at local pharmacy or Health Dept. Aware to provide a copy of the vaccination record if obtained from local pharmacy or Health Dept. Verbalized acceptance and understanding.  Flu Vaccine status: Up to date  Pneumococcal vaccine status: Declined,  Education has been provided regarding the importance  of this vaccine but patient still declined. Advised may receive this vaccine at local pharmacy or Health Dept. Aware to provide a copy of the vaccination record if obtained from local pharmacy or Health Dept. Verbalized acceptance and understanding.   Covid-19 vaccine status: Completed vaccines  Qualifies for Shingles Vaccine? Yes   Zostavax completed No   Shingrix Completed?: No.    Education has been provided regarding the importance of this vaccine. Patient has been advised to call insurance company to determine out of pocket expense if they have not yet received this vaccine. Advised may also receive vaccine at local pharmacy or Health Dept. Verbalized acceptance and understanding.  Screening Tests Health Maintenance  Topic Date Due   Pneumonia Vaccine 40+ Years old (1 of 2 - PCV) Never done   COVID-19 Vaccine (7 - 2023-24 season) 09/22/2021   Medicare Annual Wellness (AWV)  04/07/2022   Zoster Vaccines- Shingrix (1 of 2) 04/30/2022 (Originally 11/17/1964)   INFLUENZA VACCINE  08/23/2022   DTaP/Tdap/Td (2 - Td or Tdap) 02/02/2028   Hepatitis C Screening  Completed   HPV VACCINES  Aged Out   COLONOSCOPY (Pts 45-59yrs Insurance coverage will need to be confirmed)  Discontinued    Health Maintenance  Health Maintenance Due  Topic Date Due   Pneumonia Vaccine 100+ Years old (1 of 2 - PCV) Never done   COVID-19 Vaccine (7 - 2023-24 season) 09/22/2021   Medicare Annual Wellness (AWV)  04/07/2022    Colorectal cancer screening: No longer required.   Lung Cancer Screening: (Low Dose CT Chest recommended if Age 38-80 years, 30 pack-year currently smoking OR have quit w/in 15years.) does not qualify.   Lung Cancer Screening Referral: no  Additional Screening:  Hepatitis C Screening: does qualify; Completed 09/01/2020  Vision Screening: Recommended annual ophthalmology exams for early detection of glaucoma and other disorders of the eye. Is the patient up to date with their annual  eye exam?  Yes  Who is the provider or what is the name of the office in which the patient attends annual eye exams? Dr. Valetta Close If pt is not established with a provider, would they like to be referred to a provider to establish care? No .   Dental Screening: Recommended annual dental exams for proper oral hygiene  Community Resource Referral / Chronic Care Management: CRR required this visit?  No   CCM required this visit?  No      Plan:     I have personally reviewed and noted the following in the patient's chart:   Medical and social history Use of alcohol, tobacco or illicit drugs  Current medications and supplements including opioid prescriptions. Patient is not currently taking opioid prescriptions. Functional ability and status Nutritional status Physical activity Advanced directives List of other physicians Hospitalizations, surgeries, and ER visits in previous 12 months Vitals Screenings to include cognitive, depression, and falls Referrals and appointments  In addition, I have reviewed and discussed with patient certain preventive protocols, quality metrics, and best practice recommendations. A written personalized care plan for preventive services as well  as general preventive health recommendations were provided to patient.     Kellie Simmering, LPN   [id number]   Nurse Notes: none

## 2022-04-26 NOTE — Patient Instructions (Signed)
[name] , Thank you for taking time to come for your Medicare Wellness Visit. I appreciate your ongoing commitment to your health goals. Please review the following plan we discussed and let me know if I can assist you in the future.   These are the goals we discussed:  Goals      Patient Stated     No goals     Patient Stated     02/19/2019, no goals     Patient Stated     03/23/2020, no goals     Patient Stated     04/06/2021, no goals     Patient Stated     04/26/2022, no goals        This is a list of the screening recommended for you and due dates:  Health Maintenance  Topic Date Due   COVID-19 Vaccine (7 - 2023-24 season) 09/22/2021   Zoster (Shingles) Vaccine (1 of 2) 04/30/2022*   Pneumonia Vaccine (1 of 2 - PCV) 04/26/2023*   Flu Shot  08/23/2022   Medicare Annual Wellness Visit  04/26/2023   DTaP/Tdap/Td vaccine (2 - Td or Tdap) 02/02/2028   Hepatitis C Screening: USPSTF Recommendation to screen - Ages 41-79 yo.  Completed   HPV Vaccine  Aged Out   Colon Cancer Screening  Discontinued  *Topic was postponed. The date shown is not the original due date.    Advanced directives: Please bring a copy of your POA (Power of Attorney) and/or Living Will to your next appointment.   Conditions/risks identified: none  Next appointment: Follow up in one year for your annual wellness visit.   Preventive Care 70 Years and Older, Male  Preventive care refers to lifestyle choices and visits with your health care provider that can promote health and wellness. What does preventive care include? A yearly physical exam. This is also called an annual well check. Dental exams once or twice a year. Routine eye exams. Ask your health care provider how often you should have your eyes checked. Personal lifestyle choices, including: Daily care of your teeth and gums. Regular physical activity. Eating a healthy diet. Avoiding tobacco and drug use. Limiting alcohol use. Practicing  safe sex. Taking low doses of aspirin every day. Taking vitamin and mineral supplements as recommended by your health care provider. What happens during an annual well check? The services and screenings done by your health care provider during your annual well check will depend on your age, overall health, lifestyle risk factors, and family history of disease. Counseling  Your health care provider may ask you questions about your: Alcohol use. Tobacco use. Drug use. Emotional well-being. Home and relationship well-being. Sexual activity. Eating habits. History of falls. Memory and ability to understand (cognition). Work and work Statistician. Screening  You may have the following tests or measurements: Height, weight, and BMI. Blood pressure. Lipid and cholesterol levels. These may be checked every 5 years, or more frequently if you are over 81 years old. Skin check. Lung cancer screening. You may have this screening every year starting at age 53 if you have a 30-pack-year history of smoking and currently smoke or have quit within the past 15 years. Fecal occult blood test (FOBT) of the stool. You may have this test every year starting at age 77. Flexible sigmoidoscopy or colonoscopy. You may have a sigmoidoscopy every 5 years or a colonoscopy every 10 years starting at age 60. Prostate cancer screening. Recommendations will vary depending on your family history and  other risks. Hepatitis C blood test. Hepatitis B blood test. Sexually transmitted disease (STD) testing. Diabetes screening. This is done by checking your blood sugar (glucose) after you have not eaten for a while (fasting). You may have this done every 1-3 years. Abdominal aortic aneurysm (AAA) screening. You may need this if you are a current or former smoker. Osteoporosis. You may be screened starting at age 10 if you are at high risk. Talk with your health care provider about your test results, treatment options, and  if necessary, the need for more tests. Vaccines  Your health care provider may recommend certain vaccines, such as: Influenza vaccine. This is recommended every year. Tetanus, diphtheria, and acellular pertussis (Tdap, Td) vaccine. You may need a Td booster every 10 years. Zoster vaccine. You may need this after age 18. Pneumococcal 13-valent conjugate (PCV13) vaccine. One dose is recommended after age 19. Pneumococcal polysaccharide (PPSV23) vaccine. One dose is recommended after age 75. Talk to your health care provider about which screenings and vaccines you need and how often you need them. This information is not intended to replace advice given to you by your health care provider. Make sure you discuss any questions you have with your health care provider. Document Released: 02/04/2015 Document Revised: 09/28/2015 Document Reviewed: 11/09/2014 Elsevier Interactive Patient Education  2017 [place] Prevention in the Home Falls can cause injuries. They can happen to people of all ages. There are many things you can do to make your home safe and to help prevent falls. What can I do on the outside of my home? Regularly fix the edges of walkways and driveways and fix any cracks. Remove anything that might make you trip as you walk through a door, such as a raised step or threshold. Trim any bushes or trees on the path to your home. Use bright outdoor lighting. Clear any walking paths of anything that might make someone trip, such as rocks or tools. Regularly check to see if handrails are loose or broken. Make sure that both sides of any steps have handrails. Any raised decks and porches should have guardrails on the edges. Have any leaves, snow, or ice cleared regularly. Use sand or salt on walking paths during winter. Clean up any spills in your garage right away. This includes oil or grease spills. What can I do in the bathroom? Use night lights. Install grab bars by the  toilet and in the tub and shower. Do not use towel bars as grab bars. Use non-skid mats or decals in the tub or shower. If you need to sit down in the shower, use a plastic, non-slip stool. Keep the floor dry. Clean up any water that spills on the floor as soon as it happens. Remove soap buildup in the tub or shower regularly. Attach bath mats securely with double-sided non-slip rug tape. Do not have throw rugs and other things on the floor that can make you trip. What can I do in the bedroom? Use night lights. Make sure that you have a light by your bed that is easy to reach. Do not use any sheets or blankets that are too big for your bed. They should not hang down onto the floor. Have a firm chair that has side arms. You can use this for support while you get dressed. Do not have throw rugs and other things on the floor that can make you trip. What can I do in the kitchen? Clean up any spills right  away. Avoid walking on wet floors. Keep items that you use a lot in easy-to-reach places. If you need to reach something above you, use a strong step stool that has a grab bar. Keep electrical cords out of the way. Do not use floor polish or wax that makes floors slippery. If you must use wax, use non-skid floor wax. Do not have throw rugs and other things on the floor that can make you trip. What can I do with my stairs? Do not leave any items on the stairs. Make sure that there are handrails on both sides of the stairs and use them. Fix handrails that are broken or loose. Make sure that handrails are as long as the stairways. Check any carpeting to make sure that it is firmly attached to the stairs. Fix any carpet that is loose or worn. Avoid having throw rugs at the top or bottom of the stairs. If you do have throw rugs, attach them to the floor with carpet tape. Make sure that you have a light switch at the top of the stairs and the bottom of the stairs. If you do not have them, ask someone  to add them for you. What else can I do to help prevent falls? Wear shoes that: Do not have high heels. Have rubber bottoms. Are comfortable and fit you well. Are closed at the toe. Do not wear sandals. If you use a stepladder: Make sure that it is fully opened. Do not climb a closed stepladder. Make sure that both sides of the stepladder are locked into place. Ask someone to hold it for you, if possible. Clearly mark and make sure that you can see: Any grab bars or handrails. First and last steps. Where the edge of each step is. Use tools that help you move around (mobility aids) if they are needed. These include: Canes. Walkers. Scooters. Crutches. Turn on the lights when you go into a dark area. Replace any light bulbs as soon as they burn out. Set up your furniture so you have a clear path. Avoid moving your furniture around. If any of your floors are uneven, fix them. If there are any pets around you, be aware of where they are. Review your medicines with your doctor. Some medicines can make you feel dizzy. This can increase your chance of falling. Ask your doctor what other things that you can do to help prevent falls. This information is not intended to replace advice given to you by your health care provider. Make sure you discuss any questions you have with your health care provider. Document Released: 11/04/2008 Document Revised: 06/16/2015 Document Reviewed: 02/12/2014 Elsevier Interactive Patient Education  2017 [employer].

## 2022-04-27 LAB — BMP8+EGFR
BUN/Creatinine Ratio: 16 (ref 10–24)
BUN: 16 mg/dL (ref 8–27)
CO2: 25 mmol/L (ref 20–29)
Calcium: 9.7 mg/dL (ref 8.6–10.2)
Chloride: 100 mmol/L (ref 96–106)
Creatinine, Ser: 0.98 mg/dL (ref 0.76–1.27)
Glucose: 107 mg/dL — ABNORMAL HIGH (ref 70–99)
Potassium: 5.1 mmol/L (ref 3.5–5.2)
Sodium: 137 mmol/L (ref 134–144)
eGFR: 80 mL/min/{1.73_m2} (ref >59)

## 2022-04-27 LAB — MICROALBUMIN / CREATININE URINE RATIO
Creatinine, Urine: 97.4 mg/dL
Microalb/Creat Ratio: 4 mg/g creat (ref 0–29)
Microalbumin, Urine: 4.1 ug/mL

## 2022-04-27 LAB — URIC ACID: Uric Acid: 6.3 mg/dL (ref 3.8–8.4)

## 2022-04-27 LAB — HEMOGLOBIN A1C
Est. average glucose Bld gHb Est-mCnc: 131 mg/dL
Hgb A1c MFr Bld: 6.2 % — ABNORMAL HIGH (ref 4.8–5.6)

## 2022-04-28 ENCOUNTER — Encounter: Payer: Self-pay | Admitting: Internal Medicine

## 2022-05-18 DIAGNOSIS — H2513 Age-related nuclear cataract, bilateral: Secondary | ICD-10-CM | POA: Diagnosis not present

## 2022-05-18 DIAGNOSIS — H40053 Ocular hypertension, bilateral: Secondary | ICD-10-CM | POA: Diagnosis not present

## 2022-05-29 ENCOUNTER — Other Ambulatory Visit: Payer: Self-pay | Admitting: Internal Medicine

## 2022-05-29 DIAGNOSIS — F5104 Psychophysiologic insomnia: Secondary | ICD-10-CM

## 2022-05-31 DIAGNOSIS — I081 Rheumatic disorders of both mitral and tricuspid valves: Secondary | ICD-10-CM | POA: Diagnosis not present

## 2022-05-31 DIAGNOSIS — H5509 Other forms of nystagmus: Secondary | ICD-10-CM | POA: Diagnosis not present

## 2022-05-31 DIAGNOSIS — R42 Dizziness and giddiness: Secondary | ICD-10-CM | POA: Diagnosis not present

## 2022-05-31 DIAGNOSIS — E785 Hyperlipidemia, unspecified: Secondary | ICD-10-CM | POA: Diagnosis not present

## 2022-05-31 DIAGNOSIS — Z20822 Contact with and (suspected) exposure to covid-19: Secondary | ICD-10-CM | POA: Diagnosis not present

## 2022-05-31 DIAGNOSIS — I639 Cerebral infarction, unspecified: Secondary | ICD-10-CM | POA: Diagnosis not present

## 2022-05-31 DIAGNOSIS — R262 Difficulty in walking, not elsewhere classified: Secondary | ICD-10-CM | POA: Diagnosis not present

## 2022-05-31 DIAGNOSIS — I480 Paroxysmal atrial fibrillation: Secondary | ICD-10-CM | POA: Diagnosis not present

## 2022-05-31 DIAGNOSIS — R1110 Vomiting, unspecified: Secondary | ICD-10-CM | POA: Diagnosis not present

## 2022-05-31 DIAGNOSIS — R000 Tachycardia, unspecified: Secondary | ICD-10-CM | POA: Diagnosis not present

## 2022-06-01 DIAGNOSIS — R000 Tachycardia, unspecified: Secondary | ICD-10-CM | POA: Diagnosis not present

## 2022-06-01 DIAGNOSIS — I499 Cardiac arrhythmia, unspecified: Secondary | ICD-10-CM | POA: Diagnosis not present

## 2022-06-01 DIAGNOSIS — E1165 Type 2 diabetes mellitus with hyperglycemia: Secondary | ICD-10-CM | POA: Diagnosis not present

## 2022-06-01 DIAGNOSIS — I639 Cerebral infarction, unspecified: Secondary | ICD-10-CM | POA: Diagnosis not present

## 2022-06-01 DIAGNOSIS — H81313 Aural vertigo, bilateral: Secondary | ICD-10-CM | POA: Diagnosis not present

## 2022-06-01 DIAGNOSIS — H5509 Other forms of nystagmus: Secondary | ICD-10-CM | POA: Diagnosis not present

## 2022-06-01 DIAGNOSIS — I081 Rheumatic disorders of both mitral and tricuspid valves: Secondary | ICD-10-CM | POA: Diagnosis not present

## 2022-06-01 DIAGNOSIS — I480 Paroxysmal atrial fibrillation: Secondary | ICD-10-CM | POA: Diagnosis not present

## 2022-06-02 DIAGNOSIS — R262 Difficulty in walking, not elsewhere classified: Secondary | ICD-10-CM | POA: Diagnosis not present

## 2022-06-02 DIAGNOSIS — R42 Dizziness and giddiness: Secondary | ICD-10-CM | POA: Diagnosis not present

## 2022-06-02 DIAGNOSIS — I480 Paroxysmal atrial fibrillation: Secondary | ICD-10-CM | POA: Diagnosis not present

## 2022-06-02 DIAGNOSIS — R739 Hyperglycemia, unspecified: Secondary | ICD-10-CM | POA: Diagnosis not present

## 2022-06-03 ENCOUNTER — Other Ambulatory Visit: Payer: Self-pay | Admitting: Cardiovascular Disease

## 2022-06-03 DIAGNOSIS — I480 Paroxysmal atrial fibrillation: Secondary | ICD-10-CM

## 2022-06-04 NOTE — Telephone Encounter (Signed)
Please review for refill. Thank you! 

## 2022-06-04 NOTE — Telephone Encounter (Signed)
Eliquis 5mg  refill request received. Patient is 77 years old, weight-73kg, Crea-0.98 on 04/26/22, Diagnosis-Aflutter, and last seen by Dr. Duke Salvia on 10/17/20 and has an appt pending on 06/08/22. Dose is appropriate based on dosing criteria. Will send in refill to requested pharmacy.

## 2022-06-05 ENCOUNTER — Telehealth: Payer: Self-pay

## 2022-06-05 ENCOUNTER — Other Ambulatory Visit: Payer: Medicare PPO

## 2022-06-05 NOTE — Transitions of Care (Post Inpatient/ED Visit) (Signed)
   06/05/2022  Name: Maejor Andreoni MRN: [id number] DOB: [birth year]  Today's TOC FU Call Status:    Transition Care Management Follow-up Telephone Call Date of Discharge: 06/02/22 Discharge Facility: Other [facility]) Name of Other (Non-[facility]) Discharge Facility: [facility] [place] [facility] Type of Discharge: Inpatient Admission Primary Inpatient Discharge Diagnosis:: Dizziness How have you been since you were released from the hospital?: Better Any questions or concerns?: No  Items Reviewed: Did you receive and understand the discharge instructions provided?: Yes Medications obtained,verified, and reconciled?: Yes (Medications Reviewed) Any new allergies since your discharge?: No Dietary orders reviewed?: NA Do you have support at home?: Yes People in Home: alone  Medications Reviewed Today: Medications Reviewed Today     Reviewed by Dorothyann Peng, MD (Physician) on 04/26/22 at 1039  Med List Status: <None>   Medication Order Taking? Sig Documenting Provider Last Dose Status Informant  apixaban (ELIQUIS) 5 MG TABS tablet [id number] Yes Take 1 tablet by mouth twice daily Chilton Si, MD Taking Active   CALCIUM PO [id number] Yes Take 1 tablet by mouth daily. 600 mg daily [provider] Taking Active   Cholecalciferol (VITAMIN D PO) [id number] Yes Take 1 capsule by mouth daily.  [provider] Taking Active   levocetirizine (XYZAL) 5 MG tablet [id number] Yes Take 1 tablet (5 mg total) by mouth every evening. Dorothyann Peng, MD Taking Active   MAGNESIUM PO [id number] Yes Take by mouth. [provider] Taking Active   metoprolol tartrate (LOPRESSOR) 50 MG tablet [id number] Yes Take 1 tablet (50 mg total) by mouth 2 (two) times daily. Dorothyann Peng, MD Taking Active   Multiple Vitamin (MULTIVITAMIN) capsule [id number] Yes Take 1 capsule by mouth daily. [provider] Taking Active   Omega-3 Fatty Acids (FISH OIL PO) [id number]  Yes Take 1 capsule by mouth. EVERY OTHER DAY [provider] Taking Active   pantoprazole (PROTONIX) 40 MG tablet [id number] Yes Take 1 tablet (40 mg total) by mouth 2 (two) times daily. Dorothyann Peng, MD Taking Active   rosuvastatin (CRESTOR) 10 MG tablet [id number] Yes Take 1 tablet (10 mg total) by mouth daily. Dorothyann Peng, MD Taking Active   zolpidem (AMBIEN) 10 MG tablet [id number] Yes TAKE 1 TABLET BY MOUTH AT BEDTIME AS NEEDED Dorothyann Peng, MD Taking Active             Home Care and Equipment/Supplies: Were Home Health Services Ordered?: No Any new equipment or medical supplies ordered?: No  Functional Questionnaire: Do you need assistance with bathing/showering or dressing?: No Do you need assistance with meal preparation?: No Do you need assistance with eating?: No Do you have difficulty maintaining continence: No Do you need assistance with getting out of bed/getting out of a chair/moving?: No Do you have difficulty managing or taking your medications?: No  Follow up appointments reviewed: PCP Follow-up appointment confirmed?: Yes Date of PCP follow-up appointment?: 06/13/22 Follow-up Provider: Dorothyann Peng Specialist [facility] Follow-up appointment confirmed?: No Reason Specialist Follow-Up Not Confirmed: Patient has Specialist Provider Number and will Call for Appointment Do you need transportation to your follow-up appointment?: No Do you understand care options if your condition(s) worsen?: Yes-patient verbalized understanding    SIGNATURE Randa Lynn, CMA

## 2022-06-08 ENCOUNTER — Ambulatory Visit (INDEPENDENT_AMBULATORY_CARE_PROVIDER_SITE_OTHER): Payer: Medicare PPO | Admitting: Cardiovascular Disease

## 2022-06-08 ENCOUNTER — Encounter (HOSPITAL_BASED_OUTPATIENT_CLINIC_OR_DEPARTMENT_OTHER): Payer: Self-pay | Admitting: Cardiovascular Disease

## 2022-06-08 VITALS — BP 118/75 | HR 87 | Ht 70.0 in | Wt 159.6 lb

## 2022-06-08 DIAGNOSIS — R062 Wheezing: Secondary | ICD-10-CM | POA: Diagnosis not present

## 2022-06-08 DIAGNOSIS — E7800 Pure hypercholesterolemia, unspecified: Secondary | ICD-10-CM | POA: Diagnosis not present

## 2022-06-08 DIAGNOSIS — H8111 Benign paroxysmal vertigo, right ear: Secondary | ICD-10-CM | POA: Diagnosis not present

## 2022-06-08 DIAGNOSIS — H8110 Benign paroxysmal vertigo, unspecified ear: Secondary | ICD-10-CM | POA: Insufficient documentation

## 2022-06-08 DIAGNOSIS — I483 Typical atrial flutter: Secondary | ICD-10-CM | POA: Diagnosis not present

## 2022-06-08 HISTORY — DX: Wheezing: R06.2

## 2022-06-08 HISTORY — DX: Benign paroxysmal vertigo, unspecified ear: H81.10

## 2022-06-08 NOTE — Assessment & Plan Note (Addendum)
He remains in atrial flutter.  Rates are very well-controlled.  He is completely asymptomatic.  Recommend continuing metoprolol and Eliquis.  No indication for cardioversion given that he feels well and rates are well-controlled.  Recent echo showed preserved LV function.  He is euvolemic and has no evidence of heart failure on exam.

## 2022-06-08 NOTE — Assessment & Plan Note (Addendum)
He had an episode of self-limited vertigo.  Brain MRI was unremarkable.  He does report significant seasonal allergies.  I suspect this is the etiology.  Continue with OTC allergy medication.  Recommend that he add Flonase.  He has meclizine to take as needed.

## 2022-06-08 NOTE — Progress Notes (Signed)
Cardiology Office Note  Date:  06/08/2022   ID:  Carlos [name], DOB 02/10/1945, MRN [id number]  PCP:  Dorothyann Peng, MD  Cardiologist:   Chilton Si, MD   History of Present Illness: Carlos [name] is a 77 y.o. male with persistentl atrial fibrillation, hyperlipidemia and ASD s/p surgical repair who presents for follow up.  He was initially seen 08/2015 due to L sided chest pain.  He was referred for ETT, which was negative for ischemia.  He was noted to have several PVCs but was asymptomatic.  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 Allyne Gee on 05/22/17 due to dysphagia.  While there his heart rate was 130 bpm and he was in atrial fibrillation.  He was started on metoprolol and referred to cardiology.  Dr. Sherryll Burger had an echo 05/2017 that revealed LVEF 60-65%.  He was also started on Eliquis.     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  He had L inguinal hernia repair 07/2018 with Dr. Corliss Skains that went well.  He had some atypical chest pain that was thought to be due to GERD and he was started on a PPI.  Symptoms have improved since making this change.  He had a repeat echo 10/2020 that revealed LVEF 55 to 60% with mild LVH.  Left atrium was mildly dilated and right atrium was moderately dilated.  He had aortic valve sclerosis without stenosis.  Today, he notes that the week before yesterday he had presented to the ED 05/31/2022. Earlier that day he took his son to a concert in [place], and returned home. That evening around 8:30 PM he began to feel dizzy in the parking lot at [facility]. He waited until his symptoms seemed to subside. However, after a few more minutes everything started spinning. He walked into the lobby at [facility] [place] and called EMS. Reportedly, his MRI was negative, and there was some concern for an inner ear infection. In total, the episode lasted from about 9 PM until around noon the next day when he returned to his baseline. That one evening was the only episode, he had never  experienced this before. He hasn't noticed if he otherwise becomes dizzy with certain head movements. No recent sinus congestion or cold, although he does endorse a known allergy to pollen. Sometimes he hears himself wheezing a lot, which has been a relatively new concern in the past 6 weeks. He is a former smoker. He states he hasn't had a cigarette in 15 years, maybe more. Recently he has started an exercise routine of 20-25 minutes on the treadmill and some light weight training at the [facility]. On average he is exercising 4 times a week. He feels well during the activity and denies any anginal symptoms. Hasn't noticed any swelling, no difficulty with using the treadmill. Regarding his diet, he sometimes feels he isn't eating enough, but at other times he eats well. His weight has been stable around 160-161 lbs at home. He denies any palpitations, chest pain, shortness of breath, or peripheral edema. No headaches, syncope, orthopnea, or PND.   Past Medical History:  Diagnosis Date   Allergic rhinitis    Atypical chest pain 09/16/2015   BPPV (benign paroxysmal positional vertigo) 06/08/2022   Dysrhythmia    a-fib/flutter   Elevated blood pressure 10/19/2015   GERD (gastroesophageal reflux disease)    Hypertension    Insomnia    Left inguinal hernia    Mixed hyperlipidemia    Pure hypercholesterolemia 10/02/2018   PVC (premature  ventricular contraction) 10/19/2015   Typical atrial flutter (HCC) 10/02/2018   Wheezing 06/08/2022    Past Surgical History:  Procedure Laterality Date   ASD REPAIR  1994   INGUINAL HERNIA REPAIR  1994   INGUINAL HERNIA REPAIR Left 09/03/2018   Procedure: LEFT INGUINAL HERNIA REPAIR WITH MESH;  Surgeon: Manus Rudd, MD;  Location: [facility];  Service: General;  Laterality: Left;     Current Outpatient Medications  Medication Sig Dispense Refill   apixaban (ELIQUIS) 5 MG TABS tablet Take 1 tablet (5 mg total) by mouth 2 (two) times daily.  Please keep upcoming appointment. Thanks 180 tablet 0   CALCIUM PO Take 1 tablet by mouth daily. 600 mg daily     Cholecalciferol (VITAMIN D PO) Take 1 capsule by mouth daily.      colchicine 0.6 MG tablet One tab po twice daily as needed for gout, do not take for more than 5 days at a time 30 tablet 2   fluticasone (FLONASE) 50 MCG/ACT nasal spray Place 2 sprays into both nostrils daily.     levocetirizine (XYZAL) 5 MG tablet Take 1 tablet (5 mg total) by mouth every evening. 90 tablet 1   MAGNESIUM PO Take by mouth.     metoprolol tartrate (LOPRESSOR) 50 MG tablet Take 1 tablet (50 mg total) by mouth 2 (two) times daily. 180 tablet 1   Multiple Vitamin (MULTIVITAMIN) capsule Take 1 capsule by mouth daily.     pantoprazole (PROTONIX) 40 MG tablet Take 1 tablet (40 mg total) by mouth 2 (two) times daily. 180 tablet 3   rosuvastatin (CRESTOR) 10 MG tablet Take 1 tablet (10 mg total) by mouth daily. 90 tablet 2   zolpidem (AMBIEN) 10 MG tablet TAKE 1 TABLET BY MOUTH AT BEDTIME AS NEEDED 30 tablet 0   No current facility-administered medications for this visit.    Allergies:   Patient has no known allergies.    Social History:  The patient  reports that he quit smoking about 16 years ago. His smoking use included cigarettes. He has a 4.50 pack-year smoking history. He has never used smokeless tobacco. He reports current alcohol use. He reports that he does not use drugs.   Family History:  The patient's family history includes Healthy in his mother; Heart attack in his father; Pulmonary embolism in his brother.    ROS:   Please see the history of present illness. (+) Isolated episode of dizziness (+) Pollen allergies (+) Wheezing All other systems are reviewed and negative.    PHYSICAL EXAM: VS:  BP 118/75 (BP Location: Left Arm, Patient Position: Sitting, Cuff Size: Normal)   Pulse 87   Ht 5\' 10"  (1.778 m)   Wt 159 lb 9.6 oz (72.4 kg)   SpO2 96%   BMI 22.90 kg/m  , BMI Body mass  index is 22.9 kg/m. GENERAL:  Well appearing HEENT: Pupils equal round and reactive, fundi not visualized, oral mucosa unremarkable NECK:  No jugular venous distention, waveform within normal limits, carotid upstroke brisk and symmetric, no bruits LUNGS:  Clear to auscultation bilaterally HEART:  Irregularly irregular.  PMI not displaced or sustained,S1 and S2 within normal limits, no S3, no S4, no clicks, no rubs, no murmurs ABD:  Flat, positive bowel sounds normal in frequency in pitch, no bruits, no rebound, no guarding, no midline pulsatile mass, no hepatomegaly, no splenomegaly EXT:  2 plus pulses throughout, no edema, no cyanosis no clubbing SKIN:  No rashes no  nodules NEURO:  Cranial nerves II through XII grossly intact, motor grossly intact throughout PSYCH:  Cognitively intact, oriented to person place and time   EKG:   EKG is personally reviewed. 10/17/2020: Atrial flutter. Rate 84 bpm. 10/14/19: Atrial flutter.  Rate 88 bpm. 04/16/2019: Atrial flutter.  Rate 87 bpm.  PVC. 10/02/2018: Atrial flutter.  Rate 86 bpm. 05/22/17: Atrial flutter.  Ventricular rate 130 bpm.   09/16/15: sinus rhythm rate 66 bpm.   Echo  10/27/2020:  1. Left ventricular ejection fraction, by estimation, is 55 to 60%. Left  ventricular ejection fraction by 3D volume is 56 %. The left ventricle has  normal function. The left ventricle has no regional wall motion  abnormalities. There is mild concentric  left ventricular hypertrophy. Left ventricular diastolic parameters are  indeterminate due to rhythm.   2. Right ventricular systolic function is normal. The right ventricular  size is normal.   3. Left atrial size was mildly dilated.   4. Right atrial size was moderately dilated.   5. The mitral valve is normal in structure. Mild to moderate mitral valve  regurgitation. No evidence of mitral stenosis.   6. The aortic valve is tricuspid. Aortic valve regurgitation is not  visualized. Mild aortic valve  sclerosis is present, with no evidence of  aortic valve stenosis.   7. The inferior vena cava is normal in size with greater than 50%  respiratory variability, suggesting right atrial pressure of 3 mmHg.   Comparison(s): Previous echo 06/07/17 reported EF 60-65%. No significant  change.   Echo 06/07/2017: Study Conclusions - Left ventricle: The cavity size was normal. Systolic function was   normal. The estimated ejection fraction was in the range of 60%   to 65%. Wall motion was normal; there were no regional wall   motion abnormalities. - Ventricular septum: Septal motion showed paradox. - Mitral valve: There was mild to moderate regurgitation directed   centrally. - Left atrium: The atrium was mildly dilated. - Right ventricle: The cavity size was moderately dilated. Wall   thickness was normal. - Right atrium: The atrium was mildly dilated. - Atrial septum: No defect or patent foramen ovale was identified. - Pulmonary arteries: PA peak pressure: 32 mm Hg (S).   Impressions: - Atrial flutter with variable block and mild RVR during the entire   study.  ETT 10/07/15: Blood pressure demonstrated a normal response to exercise. Upsloping ST segment depression ST segment depression was noted during stress in the V4, V5, V6, II, III and aVF leads. No T wave inversion was noted during stress. Negative, adequate stress test.  Recent Labs: 01/29/2022: ALT 24; Hemoglobin 15.9; Platelets 371; TSH 1.680 04/26/2022: BUN 16; Creatinine, Ser 0.98; Potassium 5.1; Sodium 137    Lipid Panel    Component Value Date/Time   CHOL 150 01/29/2022 1155   TRIG 187 (H) 01/29/2022 1155   HDL 42 01/29/2022 1155   CHOLHDL 3.6 01/29/2022 1155   LDLCALC 76 01/29/2022 1155      Wt Readings from Last 3 Encounters:  06/08/22 159 lb 9.6 oz (72.4 kg)  04/26/22 161 lb (73 kg)  04/26/22 161 lb 9.6 oz (73.3 kg)     ASSESSMENT AND PLAN:  Typical atrial flutter (HCC) He remains in atrial flutter.  Rates  are very well-controlled.  He is completely asymptomatic.  Recommend continuing metoprolol and Eliquis.  No indication for cardioversion given that he feels well and rates are well-controlled.  Recent echo showed preserved LV function.  He  is euvolemic and has no evidence of heart failure on exam.  Pure hypercholesterolemia Lipids are well-controlled on rosuvastatin.  Continue with diet and exercise.  Wheezing He reports wheezing.  No wheezing on exam today.  He does report a remote smoking history.  Will refer him for PFTs.  BPPV (benign paroxysmal positional vertigo) He had an episode of self-limited vertigo.  Brain MRI was unremarkable.  He does report significant seasonal allergies.  I suspect this is the etiology.  Continue with OTC allergy medication.  Recommend that he add Flonase.  He has meclizine to take as needed.   Disposition:   FU with Brittish Bolinger C. Duke Salvia, MD, [facility] in 1 year.  Medication Adjustments/Labs and Tests Ordered: Current medicines are reviewed at length with the patient today.  Concerns regarding medicines are outlined above.   Orders Placed This Encounter  Procedures   Pulmonary function test   No orders of the defined types were placed in this encounter.  Patient Instructions  Medication Instructions:  TRY FLONASE OVER THE COUNTER,  1-2 SPRAYS EACH NOSTRIL DAILY AS NEEDED    *If you need a refill on your cardiac medications before your next appointment, please call your pharmacy*  Lab Work: NONE  Testing/Procedures: Your physician has recommended that you have a pulmonary function test. Pulmonary Function Tests are a group of tests that measure how well air moves in and out of your lungs.  Follow-Up: At [facility], you and your health needs are our priority.  As part of our continuing mission to provide you with exceptional heart care, we have created designated Provider Care Teams.  These Care Teams include your primary Cardiologist  (physician) and Advanced Practice Providers (APPs -  Physician Assistants and Nurse Practitioners) who all work together to provide you with the care you need, when you need it.  We recommend signing up for the patient portal called "MyChart".  Sign up information is provided on this After Visit Summary.  MyChart is used to connect with patients for Virtual Visits (Telemedicine).  Patients are able to view lab/test results, encounter notes, upcoming appointments, etc.  Non-urgent messages can be sent to your provider as well.   To learn more about what you can do with MyChart, go to [email or web address].    Your next appointment:   12 month(s)  Provider:   Chilton Si, MD       [facility] Stumpf,acting as a scribe for Chilton Si, MD.,have documented all relevant documentation on the behalf of Chilton Si, MD,as directed by  Chilton Si, MD while in the presence of Chilton Si, MD.  I, Fadel Clason C. Duke Salvia, MD have reviewed all documentation for this visit.  The documentation of the exam, diagnosis, procedures, and orders on 06/08/2022 are all accurate and complete.  Signed, Treniyah Lynn C. Duke Salvia, MD, [facility]  06/08/2022 9:04 AM    [facility]

## 2022-06-08 NOTE — Assessment & Plan Note (Signed)
He reports wheezing.  No wheezing on exam today.  He does report a remote smoking history.  Will refer him for PFTs.

## 2022-06-08 NOTE — Assessment & Plan Note (Signed)
Lipids are well-controlled on rosuvastatin.  Continue with diet and exercise.

## 2022-06-08 NOTE — Patient Instructions (Addendum)
Medication Instructions:  TRY FLONASE OVER THE COUNTER,  1-2 SPRAYS EACH NOSTRIL DAILY AS NEEDED    *If you need a refill on your cardiac medications before your next appointment, please call your pharmacy*  Lab Work: NONE  Testing/Procedures: Your physician has recommended that you have a pulmonary function test. Pulmonary Function Tests are a group of tests that measure how well air moves in and out of your lungs.  Follow-Up: At [facility], you and your health needs are our priority.  As part of our continuing mission to provide you with exceptional heart care, we have created designated Provider Care Teams.  These Care Teams include your primary Cardiologist (physician) and Advanced Practice Providers (APPs -  Physician Assistants and Nurse Practitioners) who all work together to provide you with the care you need, when you need it.  We recommend signing up for the patient portal called "MyChart".  Sign up information is provided on this After Visit Summary.  MyChart is used to connect with patients for Virtual Visits (Telemedicine).  Patients are able to view lab/test results, encounter notes, upcoming appointments, etc.  Non-urgent messages can be sent to your provider as well.   To learn more about what you can do with MyChart, go to [email or web address].    Your next appointment:   12 month(s)  Provider:   Chilton Si, MD

## 2022-06-08 NOTE — Progress Notes (Deleted)
Cardiology Office Note   Date:  06/08/2022   ID:  Carlos [name], DOB [birth year], MRN [id number]  PCP:  Dorothyann Peng, MD  Cardiologist:   Chilton Si, MD   No chief complaint on file.     History of Present Illness: Carlos [name] is a 76 y.o. male with persistentl atrial fibrillation, hyperlipidemia and ASD s/p surgical repair who presents for follow up.  He was initially seen 08/2015 due to L sided chest pain.  He was referred for ETT, which was negative for ischemia.  He was noted to have several PVCs but was asymptomatic.  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 Allyne Gee on 05/22/17 due to dysphagia.  While there his heart rate was 130 bpm and he was in atrial fibrillation.  He was started on metoprolol and referred to cardiology.  Dr. Sherryll Burger had an echo 05/2017 that revealed LVEF 60-65%.  He was also started on Eliquis.     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  He had L inguinal hernia repair 07/2018 with Dr. Corliss Skains that went well.  He had some atypical chest pain that was thought to be due to GERD and he was started on a PPI.  Symptoms have improved since making this change.  He had a repeat echo 10/2020 that revealed LVEF 55 to 60% with mild LVH.  Left atrium was mildly dilated and right atrium was moderately dilated.  He had aortic valve sclerosis without stenosis.   Past Medical History:  Diagnosis Date   Allergic rhinitis    Atypical chest pain 09/16/2015   Dysrhythmia    a-fib/flutter   Elevated blood pressure 10/19/2015   GERD (gastroesophageal reflux disease)    Hypertension    Insomnia    Left inguinal hernia    Mixed hyperlipidemia    Pure hypercholesterolemia 10/02/2018   PVC (premature ventricular contraction) 10/19/2015   Typical atrial flutter (HCC) 10/02/2018    Past Surgical History:  Procedure Laterality Date   ASD REPAIR  1994   INGUINAL HERNIA REPAIR  1994   INGUINAL HERNIA REPAIR Left 09/03/2018   Procedure: LEFT INGUINAL HERNIA REPAIR WITH  MESH;  Surgeon: Manus Rudd, MD;  Location: [facility];  Service: General;  Laterality: Left;     Current Outpatient Medications  Medication Sig Dispense Refill   apixaban (ELIQUIS) 5 MG TABS tablet Take 1 tablet (5 mg total) by mouth 2 (two) times daily. Please keep upcoming appointment. Thanks 180 tablet 0   CALCIUM PO Take 1 tablet by mouth daily. 600 mg daily     Cholecalciferol (VITAMIN D PO) Take 1 capsule by mouth daily.      colchicine 0.6 MG tablet One tab po twice daily as needed for gout, do not take for more than 5 days at a time 30 tablet 2   levocetirizine (XYZAL) 5 MG tablet Take 1 tablet (5 mg total) by mouth every evening. 90 tablet 1   MAGNESIUM PO Take by mouth.     metoprolol tartrate (LOPRESSOR) 50 MG tablet Take 1 tablet (50 mg total) by mouth 2 (two) times daily. 180 tablet 1   Multiple Vitamin (MULTIVITAMIN) capsule Take 1 capsule by mouth daily.     Omega-3 Fatty Acids (FISH OIL PO) Take 1 capsule by mouth. EVERY OTHER DAY     pantoprazole (PROTONIX) 40 MG tablet Take 1 tablet (40 mg total) by mouth 2 (two) times daily. 180 tablet 3   rosuvastatin (CRESTOR) 10 MG tablet Take  1 tablet (10 mg total) by mouth daily. 90 tablet 2   zolpidem (AMBIEN) 10 MG tablet TAKE 1 TABLET BY MOUTH AT BEDTIME AS NEEDED 30 tablet 0   No current facility-administered medications for this visit.    Allergies:   Patient has no known allergies.    Social History:  The patient  reports that he quit smoking about 16 years ago. His smoking use included cigarettes. He has a 4.50 pack-year smoking history. He has never used smokeless tobacco. He reports current alcohol use. He reports that he does not use drugs.   Family History:  The patient's family history includes Healthy in his mother; Heart attack in his father; Pulmonary embolism in his brother.    ROS:   Please see the history of present illness. (+) Anxiety (+) Insomnia (+) Bilateral LE pain, feet  (+)  Fluttering sensations in his bilateral eyes All other systems are reviewed and negative.    PHYSICAL EXAM: VS:  There were no vitals taken for this visit. , BMI There is no height or weight on file to calculate BMI. GENERAL:  Well appearing HEENT: Pupils equal round and reactive, fundi not visualized, oral mucosa unremarkable NECK:  No jugular venous distention, waveform within normal limits, carotid upstroke brisk and symmetric, no bruits LUNGS:  Clear to auscultation bilaterally HEART:  Irregularly irregular.  PMI not displaced or sustained,S1 and S2 within normal limits, no S3, no S4, no clicks, no rubs, no murmurs ABD:  Flat, positive bowel sounds normal in frequency in pitch, no bruits, no rebound, no guarding, no midline pulsatile mass, no hepatomegaly, no splenomegaly EXT:  2 plus pulses throughout, no edema, no cyanosis no clubbing SKIN:  No rashes no nodules NEURO:  Cranial nerves II through XII grossly intact, motor grossly intact throughout PSYCH:  Cognitively intact, oriented to person place and time   EKG:   10/17/2020: Atrial flutter. Rate 84 bpm. 10/14/19: Atrial flutter.  Rate 88 bpm. 04/16/2019: Atrial flutter.  Rate 87 bpm.  PVC. 10/02/2018: Atrial flutter.  Rate 86 bpm. 05/22/17: Atrial flutter.  Ventricular rate 130 bpm.   09/16/15: sinus rhythm rate 66 bpm.   Echo 06/07/2017: Study Conclusions   - Left ventricle: The cavity size was normal. Systolic function was   normal. The estimated ejection fraction was in the range of 60%   to 65%. Wall motion was normal; there were no regional wall   motion abnormalities. - Ventricular septum: Septal motion showed paradox. - Mitral valve: There was mild to moderate regurgitation directed   centrally. - Left atrium: The atrium was mildly dilated. - Right ventricle: The cavity size was moderately dilated. Wall   thickness was normal. - Right atrium: The atrium was mildly dilated. - Atrial septum: No defect or patent foramen  ovale was identified. - Pulmonary arteries: PA peak pressure: 32 mm Hg (S).   Impressions:   - Atrial flutter with variable block and mild RVR during the entire   study.  ETT 10/07/15: Blood pressure demonstrated a normal response to exercise. Upsloping ST segment depression ST segment depression was noted during stress in the V4, V5, V6, II, III and aVF leads. No T wave inversion was noted during stress. Negative, adequate stress test.  Recent Labs: 01/29/2022: ALT 24; Hemoglobin 15.9; Platelets 371; TSH 1.680 04/26/2022: BUN 16; Creatinine, Ser 0.98; Potassium 5.1; Sodium 137    Lipid Panel    Component Value Date/Time   CHOL 150 01/29/2022 1155   TRIG 187 (H) 01/29/2022  1155   HDL 42 01/29/2022 1155   CHOLHDL 3.6 01/29/2022 1155   LDLCALC 76 01/29/2022 1155      Wt Readings from Last 3 Encounters:  04/26/22 161 lb (73 kg)  04/26/22 161 lb 9.6 oz (73.3 kg)  01/29/22 160 lb (72.6 kg)      ASSESSMENT AND PLAN: No problem-specific Assessment & Plan notes found for this encounter.   # Persistent atrial flutter: Mr. Winrow is remains in atrial flutter.  The rates are well-controlled and he is completely asymptomatic.  Continue metoprolol and Eliquis.  # Atypical chest pain:   Resolved.   ETT negative for ischemia.  # GERD: Chest pain has resolved and symptoms improved on pantoprazole.  # Hyperlipidemia:  Lipids were well controlled on rosuvastatin but were elevated at his last visit.  He reports that he is taking it.  He is going to work on limiting his sweets and repeat his lipids when he sees Dr. Allyne Gee in December.  If his LDL remains over 100, would recommend increasing the rosuvastatin to 20 mg.    Current medicines are reviewed at length with the patient today.  The patient does not have concerns regarding medicines.  The following changes have been made:  no change  Labs/ tests ordered today include:   No orders of the defined types were placed in this  encounter.   Disposition:   FU with Judaea Burgoon C. Duke Salvia, MD, [facility] in 1 year.   I,Mathew Stumpf,acting as a Neurosurgeon for Chilton Si, MD.,have documented all relevant documentation on the behalf of Chilton Si, MD,as directed by  Chilton Si, MD while in the presence of Chilton Si, MD.  I, Chancy Claros C. Duke Salvia, MD have reviewed all documentation for this visit.  The documentation of the exam, diagnosis, procedures, and orders on 06/08/2022 are all accurate and complete.   Signed, Cuahut[place]oc Attar C. Duke Salvia, MD, [facility]  06/08/2022 8:08 AM    [facility]

## 2022-06-28 ENCOUNTER — Other Ambulatory Visit: Payer: Self-pay | Admitting: Internal Medicine

## 2022-06-28 DIAGNOSIS — F5104 Psychophysiologic insomnia: Secondary | ICD-10-CM

## 2022-07-02 ENCOUNTER — Ambulatory Visit (INDEPENDENT_AMBULATORY_CARE_PROVIDER_SITE_OTHER): Payer: Medicare PPO | Admitting: Internal Medicine

## 2022-07-02 ENCOUNTER — Encounter: Payer: Self-pay | Admitting: Internal Medicine

## 2022-07-02 VITALS — BP 128/60 | HR 82 | Temp 98.3°F | Ht 69.0 in | Wt 158.8 lb

## 2022-07-02 DIAGNOSIS — M109 Gout, unspecified: Secondary | ICD-10-CM

## 2022-07-02 DIAGNOSIS — Z2821 Immunization not carried out because of patient refusal: Secondary | ICD-10-CM

## 2022-07-02 DIAGNOSIS — E785 Hyperlipidemia, unspecified: Secondary | ICD-10-CM

## 2022-07-02 DIAGNOSIS — E1169 Type 2 diabetes mellitus with other specified complication: Secondary | ICD-10-CM | POA: Diagnosis not present

## 2022-07-02 DIAGNOSIS — Z87898 Personal history of other specified conditions: Secondary | ICD-10-CM | POA: Diagnosis not present

## 2022-07-02 MED ORDER — TRUE METRIX GO GLUCOSE METER W/DEVICE KIT
PACK | 1 refills | Status: AC
Start: 2022-07-02 — End: ?

## 2022-07-02 NOTE — Progress Notes (Signed)
Hershal Coria Martin,acting as a Neurosurgeon for [name], MD.,have documented all relevant documentation on the behalf of [name], MD,as directed by  [name], MD while in the presence of [name], MD.   Subjective:  Patient ID: Carlos [name] , male    DOB: 07-15-1945 , 77 y.o.   MRN: [id number]  Chief Complaint  Patient presents with   Gout    HPI  Patient presents today for f/u gout.  He was seen in April 2024 for gout flare. He was prescribed colchicine and his sx resolved. He is no longer taking colchicine. He has not had any further flares. Patient reports compliance with medications and has no other concerns today.   BP Readings from Last 3 Encounters: 07/02/22 : 128/60 06/08/22 : 118/75 04/26/22 : 110/60       Past Medical History:  Diagnosis Date   Allergic rhinitis    Atypical chest pain 09/16/2015   BPPV (benign paroxysmal positional vertigo) 06/08/2022   Dysrhythmia    a-fib/flutter   Elevated blood pressure 10/19/2015   GERD (gastroesophageal reflux disease)    Hypertension    Insomnia    Left inguinal hernia    Mixed hyperlipidemia    Pure hypercholesterolemia 10/02/2018   PVC (premature ventricular contraction) 10/19/2015   Typical atrial flutter (HCC) 10/02/2018   Wheezing 06/08/2022     Family History  Problem Relation Age of Onset   Healthy Mother    Heart attack Father    Pulmonary embolism Brother      Current Outpatient Medications:    apixaban (ELIQUIS) 5 MG TABS tablet, Take 1 tablet (5 mg total) by mouth 2 (two) times daily. Please keep upcoming appointment. Thanks, Disp: 180 tablet, Rfl: 0   Blood Glucose Monitoring Suppl (TRUE METRIX GO GLUCOSE METER) w/Device KIT, USE TO CHECK BLOOD SUGARS ONCE DAILY., Disp: 1 kit, Rfl: 1   CALCIUM PO, Take 1 tablet by mouth daily. 600 mg daily, Disp: , Rfl:    Cholecalciferol (VITAMIN D PO), Take 1 capsule by mouth daily. , Disp: , Rfl:    colchicine 0.6 MG tablet, One tab po  twice daily as needed for gout, do not take for more than 5 days at a time, Disp: 30 tablet, Rfl: 2   MAGNESIUM PO, Take by mouth., Disp: , Rfl:    metoprolol tartrate (LOPRESSOR) 50 MG tablet, Take 1 tablet (50 mg total) by mouth 2 (two) times daily., Disp: 180 tablet, Rfl: 1   Multiple Vitamin (MULTIVITAMIN) capsule, Take 1 capsule by mouth daily., Disp: , Rfl:    pantoprazole (PROTONIX) 40 MG tablet, Take 1 tablet (40 mg total) by mouth 2 (two) times daily., Disp: 180 tablet, Rfl: 3   rosuvastatin (CRESTOR) 10 MG tablet, Take 1 tablet (10 mg total) by mouth daily., Disp: 90 tablet, Rfl: 2   zolpidem (AMBIEN) 10 MG tablet, TAKE 1 TABLET BY MOUTH AT BEDTIME AS NEEDED, Disp: 30 tablet, Rfl: 1   fluticasone (FLONASE) 50 MCG/ACT nasal spray, Place 2 sprays into both nostrils daily. (Patient not taking: Reported on 07/02/2022), Disp: , Rfl:    levocetirizine (XYZAL) 5 MG tablet, Take 1 tablet (5 mg total) by mouth every evening. (Patient not taking: Reported on 07/02/2022), Disp: 90 tablet, Rfl: 1   No Known Allergies   Review of Systems  Constitutional: Negative.   Respiratory: Negative.    Cardiovascular: Negative.   Gastrointestinal: Negative.   Neurological: Negative.   Psychiatric/Behavioral: Negative.  Today's Vitals   07/02/22 1528  BP: 128/60  Pulse: 82  Temp: 98.3 F (36.8 C)  TempSrc: Oral  Weight: 158 lb 12.8 oz (72 kg)  Height: 5\' 9"  (1.753 m)  PainSc: 0-No pain   Body mass index is 23.45 kg/m.  Wt Readings from Last 3 Encounters:  07/02/22 158 lb 12.8 oz (72 kg)  06/08/22 159 lb 9.6 oz (72.4 kg)  04/26/22 161 lb (73 kg)    The 10-year ASCVD risk score (Arnett DK, et al., 2019) is: 48.7%   Values used to calculate the score:     Age: 74 years     Sex: Male     Is Non-Hispanic African American: No     Diabetic: Yes     Tobacco smoker: No     Systolic Blood Pressure: 128 mmHg     Is BP treated: Yes     HDL Cholesterol: 39 mg/dL     Total Cholesterol: 139  mg/dL  Objective:  Physical Exam Vitals and nursing note reviewed.  Constitutional:      Appearance: Normal appearance.  HENT:     Head: Normocephalic and atraumatic.  Eyes:     Extraocular Movements: Extraocular movements intact.  Cardiovascular:     Rate and Rhythm: Normal rate and regular rhythm.     Heart sounds: Normal heart sounds.  Pulmonary:     Effort: Pulmonary effort is normal.     Breath sounds: Normal breath sounds.  Musculoskeletal:     Cervical back: Normal range of motion.  Skin:    General: Skin is warm.  Neurological:     General: No focal deficit present.     Mental Status: He is alert.  Psychiatric:        Mood and Affect: Mood normal.         Assessment And Plan:  1. Podagra Comments: Chronic, sx resolved with colchicine. If he has repeat flare within six months, will consider starting allopurinol 100mg  daily. - Uric acid; Future  2. Dyslipidemia associated with type 2 diabetes mellitus (HCC) Comments: He was initially diagnosed in January 2024 w/ an a1c 7.0.  He does not wish to take rx meds. He declines Nutrition referral. - Hemoglobin A1c; Future - CMP14+EGFR; Future - Blood Glucose Monitoring Suppl (TRUE METRIX GO GLUCOSE METER) w/Device KIT; USE TO CHECK BLOOD SUGARS ONCE DAILY.  Dispense: 1 kit; Refill: 1  3. History of vertigo Comments: ER visit from 05/31/22 [facility] ER visit reviewed in Care Everywhere.  4. Herpes zoster vaccination declined    Return in 4 months (on 11/01/2022), or dm check, for change July appt to lab visit please.  Patient was given opportunity to ask questions. Patient verbalized understanding of the plan and was able to repeat key elements of the plan. All questions were answered to their satisfaction.   I, [name], MD, have reviewed all documentation for this visit. The documentation on 07/15/22 for the exam, diagnosis, procedures, and orders are all accurate and complete.   IF YOU HAVE BEEN REFERRED  TO A SPECIALIST, IT MAY TAKE 1-2 WEEKS TO SCHEDULE/PROCESS THE REFERRAL. IF YOU HAVE NOT HEARD FROM US/SPECIALIST IN TWO WEEKS, PLEASE GIVE [place] A CALL AT [phone].

## 2022-07-02 NOTE — Patient Instructions (Signed)

## 2022-07-15 DIAGNOSIS — E1169 Type 2 diabetes mellitus with other specified complication: Secondary | ICD-10-CM | POA: Insufficient documentation

## 2022-07-15 DIAGNOSIS — Z87898 Personal history of other specified conditions: Secondary | ICD-10-CM | POA: Insufficient documentation

## 2022-07-23 ENCOUNTER — Other Ambulatory Visit: Payer: Medicare PPO

## 2022-07-23 DIAGNOSIS — E1169 Type 2 diabetes mellitus with other specified complication: Secondary | ICD-10-CM | POA: Diagnosis not present

## 2022-07-23 DIAGNOSIS — E785 Hyperlipidemia, unspecified: Secondary | ICD-10-CM | POA: Diagnosis not present

## 2022-07-23 DIAGNOSIS — M109 Gout, unspecified: Secondary | ICD-10-CM | POA: Diagnosis not present

## 2022-07-24 LAB — CMP14+EGFR
ALT: 18 IU/L (ref 0–44)
AST: 17 IU/L (ref 0–40)
Albumin: 4.5 g/dL (ref 3.8–4.8)
Alkaline Phosphatase: 96 IU/L (ref 44–121)
BUN/Creatinine Ratio: 12 (ref 10–24)
BUN: 12 mg/dL (ref 8–27)
Bilirubin Total: 0.9 mg/dL (ref 0.0–1.2)
CO2: 25 mmol/L (ref 20–29)
Calcium: 10.2 mg/dL (ref 8.6–10.2)
Chloride: 102 mmol/L (ref 96–106)
Creatinine, Ser: 0.97 mg/dL (ref 0.76–1.27)
Globulin, Total: 2.9 g/dL (ref 1.5–4.5)
Glucose: 101 mg/dL — ABNORMAL HIGH (ref 70–99)
Potassium: 5.1 mmol/L (ref 3.5–5.2)
Sodium: 142 mmol/L (ref 134–144)
Total Protein: 7.4 g/dL (ref 6.0–8.5)
eGFR: 81 mL/min/{1.73_m2} (ref >59)

## 2022-07-24 LAB — HEMOGLOBIN A1C
Est. average glucose Bld gHb Est-mCnc: 137 mg/dL
Hgb A1c MFr Bld: 6.4 % — ABNORMAL HIGH (ref 4.8–5.6)

## 2022-07-24 LAB — URIC ACID: Uric Acid: 7.7 mg/dL (ref 3.8–8.4)

## 2022-07-27 ENCOUNTER — Encounter: Payer: Self-pay | Admitting: Internal Medicine

## 2022-07-27 ENCOUNTER — Other Ambulatory Visit: Payer: Self-pay

## 2022-07-30 ENCOUNTER — Other Ambulatory Visit: Payer: Self-pay

## 2022-07-30 ENCOUNTER — Ambulatory Visit: Payer: Medicare PPO | Admitting: Internal Medicine

## 2022-07-30 MED ORDER — ALLOPURINOL 100 MG PO TABS: 100.0000 mg | ORAL_TABLET | Freq: Every day | ORAL | 1 refills | Status: DC

## 2022-08-17 NOTE — Telephone Encounter (Signed)
Error

## 2022-08-27 ENCOUNTER — Other Ambulatory Visit: Payer: Self-pay | Admitting: Internal Medicine

## 2022-08-27 DIAGNOSIS — F5104 Psychophysiologic insomnia: Secondary | ICD-10-CM

## 2022-09-18 ENCOUNTER — Other Ambulatory Visit: Payer: Self-pay | Admitting: Cardiovascular Disease

## 2022-09-18 DIAGNOSIS — I480 Paroxysmal atrial fibrillation: Secondary | ICD-10-CM

## 2022-09-19 NOTE — Telephone Encounter (Signed)
Please review for refill. Thank you! 

## 2022-09-19 NOTE — Telephone Encounter (Signed)
Prescription refill request for Eliquis received. Indication: Aflutter Last office visit: 06/08/22 Duke Salvia)  Scr: 0.97 (01/28/22)  Age: 77 Weight: 72kg  Appropriate dose. Refill sent.

## 2022-10-26 ENCOUNTER — Other Ambulatory Visit: Payer: Self-pay | Admitting: Internal Medicine

## 2022-11-01 ENCOUNTER — Encounter: Payer: Self-pay | Admitting: Internal Medicine

## 2022-11-01 ENCOUNTER — Other Ambulatory Visit: Payer: Medicare PPO

## 2022-11-01 ENCOUNTER — Ambulatory Visit: Payer: Medicare PPO | Admitting: Internal Medicine

## 2022-11-01 VITALS — BP 118/80 | HR 82 | Temp 97.9°F | Ht 69.0 in | Wt 157.8 lb

## 2022-11-01 DIAGNOSIS — F5104 Psychophysiologic insomnia: Secondary | ICD-10-CM

## 2022-11-01 DIAGNOSIS — M109 Gout, unspecified: Secondary | ICD-10-CM

## 2022-11-01 DIAGNOSIS — I483 Typical atrial flutter: Secondary | ICD-10-CM | POA: Diagnosis not present

## 2022-11-01 DIAGNOSIS — M1A0790 Idiopathic chronic gout, unspecified ankle and foot, without tophus (tophi): Secondary | ICD-10-CM

## 2022-11-01 DIAGNOSIS — E785 Hyperlipidemia, unspecified: Secondary | ICD-10-CM

## 2022-11-01 DIAGNOSIS — E1169 Type 2 diabetes mellitus with other specified complication: Secondary | ICD-10-CM

## 2022-11-01 DIAGNOSIS — I480 Paroxysmal atrial fibrillation: Secondary | ICD-10-CM | POA: Diagnosis not present

## 2022-11-01 DIAGNOSIS — Z2821 Immunization not carried out because of patient refusal: Secondary | ICD-10-CM

## 2022-11-01 MED ORDER — ZOLPIDEM TARTRATE 10 MG PO TABS
10.0000 mg | ORAL_TABLET | Freq: Every evening | ORAL | 0 refills | Status: DC | PRN
Start: 2022-11-01 — End: 2023-01-22

## 2022-11-01 NOTE — Assessment & Plan Note (Signed)
He needs 60 day supply of zolpidem because he will be traveling to [place] in November for five weeks. He has already spoken to insurance company/pharmacist about this.

## 2022-11-01 NOTE — Assessment & Plan Note (Signed)
Chronic, he has decided to stop allopurinol.  States he does not want to take daily medication. Only wants to take colchicine as needed.

## 2022-11-01 NOTE — Assessment & Plan Note (Addendum)
He was initially diagnosed in January 2024 w/ an a1c 7.0. He still does not wish to take rx meds. I did explain the importance of use of SGLT2i to decrease cardiac/renal complications from diabetes. He does not wish to start at this time. He was given the names so he could research on his own. LDL goal is less than 70.  He will continue with rosuvastatin 10mg  daily.

## 2022-11-01 NOTE — Assessment & Plan Note (Signed)
Chronic, HR irregular today. He will continue with metoprolol and apixaban twice daily as per Cardiology. Most recent Cardiology note was reviewed in detail. He is not having any symptoms at this time .

## 2022-11-01 NOTE — Progress Notes (Signed)
Carlos [name], CMA,acting as a scribe for Carlos Aliment, MD.,have documented all relevant documentation on the behalf of Carlos Aliment, MD,as directed by  Carlos Aliment, MD while in the presence of Carlos Aliment, MD.  Subjective:  Patient ID: Carlos [name] , male    DOB: 1945/10/29 , 77 y.o.   MRN: [id number]  Chief Complaint  Patient presents with   Diabetes   Hyperlipidemia    HPI  Patient presents today for DM & CHOL follow up, Patient reports compliance with medication. Patient denies any chest pain, SOB, or headaches. He reports leaving for [place] for a month. He would like Zolpidem for a 2 month supply.  He has DM eye exam scheduled for tomorrow.  He also already had labs drawn before being seen today.       Past Medical History:  Diagnosis Date   Allergic rhinitis    Atypical chest pain 09/16/2015   BPPV (benign paroxysmal positional vertigo) 06/08/2022   Dysrhythmia    a-fib/flutter   Elevated blood pressure 10/19/2015   GERD (gastroesophageal reflux disease)    Hypertension    Insomnia    Left inguinal hernia    Mixed hyperlipidemia    Pure hypercholesterolemia 10/02/2018   PVC (premature ventricular contraction) 10/19/2015   Typical atrial flutter (HCC) 10/02/2018   Wheezing 06/08/2022     Family History  Problem Relation Age of Onset   Healthy Mother    Heart attack Father    Pulmonary embolism Brother      Current Outpatient Medications:    apixaban (ELIQUIS) 5 MG TABS tablet, Take 1 tablet by mouth twice daily, Disp: 180 tablet, Rfl: 1   Blood Glucose Monitoring Suppl (TRUE METRIX GO GLUCOSE METER) w/Device KIT, USE TO CHECK BLOOD SUGARS ONCE DAILY., Disp: 1 kit, Rfl: 1   CALCIUM PO, Take 1 tablet by mouth daily. 600 mg daily, Disp: , Rfl:    Cholecalciferol (VITAMIN D PO), Take 1 capsule by mouth daily. , Disp: , Rfl:    MAGNESIUM PO, Take by mouth., Disp: , Rfl:    metoprolol tartrate (LOPRESSOR) 50 MG tablet, Take 1 tablet by mouth twice  daily, Disp: 180 tablet, Rfl: 0   Multiple Vitamin (MULTIVITAMIN) capsule, Take 1 capsule by mouth daily., Disp: , Rfl:    pantoprazole (PROTONIX) 40 MG tablet, Take 1 tablet (40 mg total) by mouth 2 (two) times daily., Disp: 180 tablet, Rfl: 3   rosuvastatin (CRESTOR) 10 MG tablet, Take 1 tablet (10 mg total) by mouth daily., Disp: 90 tablet, Rfl: 2   colchicine 0.6 MG tablet, One tab po twice daily as needed for gout, do not take for [name] than 5 days at a time (Patient not taking: Reported on 11/01/2022), Disp: 30 tablet, Rfl: 2   zolpidem (AMBIEN) 10 MG tablet, Take 1 tablet (10 mg total) by mouth at bedtime as needed., Disp: 60 tablet, Rfl: 0   No Known Allergies   Review of Systems  Constitutional: Negative.   HENT: Negative.    Eyes: Negative.   Respiratory: Negative.    Cardiovascular: Negative.   Gastrointestinal: Negative.      Today's Vitals   11/01/22 0900 11/01/22 0926  BP: 116/82 118/80  Pulse: 82   Temp: 97.9 F (36.6 C)   SpO2: 98%   Weight: 157 lb 12.8 oz (71.6 kg)   Height: 5\' 9"  (1.753 m)    Body mass index is 23.3 kg/m.  Wt Readings from Last 3 Encounters:  11/01/22 157 lb 12.8 oz (71.6 kg)  07/02/22 158 lb 12.8 oz (72 kg)  06/08/22 159 lb 9.6 oz (72.4 kg)    The 10-year ASCVD risk score (Arnett DK, et al., 2019) is: 43.8%   Values used to calculate the score:     Age: 73 years     Sex: Male     Is Non-Hispanic African American: No     Diabetic: Yes     Tobacco smoker: No     Systolic Blood Pressure: 118 mmHg     Is BP treated: Yes     HDL Cholesterol: 39 mg/dL     Total Cholesterol: 139 mg/dL  Objective:  Physical Exam Vitals and nursing note reviewed.  Constitutional:      Appearance: Normal appearance.  HENT:     Head: Normocephalic and atraumatic.  Eyes:     Extraocular Movements: Extraocular movements intact.  Cardiovascular:     Rate and Rhythm: Normal rate. Rhythm irregular.     Heart sounds: Normal heart sounds.  Pulmonary:      Effort: Pulmonary effort is normal.     Breath sounds: Normal breath sounds.  Musculoskeletal:     Cervical back: Normal range of motion.  Skin:    General: Skin is warm.  Neurological:     General: No focal deficit present.     Mental Status: He is alert.  Psychiatric:        Mood and Affect: Mood normal.       Assessment And Plan:  Dyslipidemia associated with type 2 diabetes mellitus (HCC) Assessment & Plan: He was initially diagnosed in January 2024 w/ an a1c 7.0. He still does not wish to take rx meds. I did explain the importance of use of SGLT2i to decrease cardiac/renal complications from diabetes. He does not wish to start at this time. He was given the names so he could research on his own. LDL goal is less than 70.  He will continue with rosuvastatin 10mg  daily.   Orders: -     Lipid panel -     Hemoglobin A1c  Typical atrial flutter (HCC) Assessment & Plan: Chronic, HR irregular today. He will continue with metoprolol and apixaban twice daily as per Cardiology. Most recent Cardiology note was reviewed in detail. He is not having any symptoms at this time .   Chronic idiopathic gout involving toe without tophus, unspecified laterality Assessment & Plan: Chronic, he has decided to stop allopurinol.  States he does not want to take daily medication. Only wants to take colchicine as needed.   Orders: -     Uric acid  Chronic insomnia Assessment & Plan: He needs 60 day supply of zolpidem because he will be traveling to [place] in November for five weeks. He has already spoken to insurance company/pharmacist about this.   Orders: -     Zolpidem Tartrate; Take 1 tablet (10 mg total) by mouth at bedtime as needed.  Dispense: 60 tablet; Refill: 0  Influenza vaccination declined    Return if symptoms worsen or fail to improve.  Patient was given opportunity to ask questions. Patient verbalized understanding of the plan and was able to repeat key elements of the plan.  All questions were answered to their satisfaction.    I, Carlos Aliment, MD, have reviewed all documentation for this visit. The documentation on 11/01/22 for the exam, diagnosis, procedures, and orders are all accurate and complete.   IF YOU HAVE BEEN REFERRED TO A  SPECIALIST, IT MAY TAKE 1-2 WEEKS TO SCHEDULE/PROCESS THE REFERRAL. IF YOU HAVE NOT HEARD FROM US/SPECIALIST IN TWO WEEKS, PLEASE GIVE [place] A CALL AT [phone].

## 2022-11-01 NOTE — Patient Instructions (Addendum)
Marcelline Deist Jardiance  Type 2 Diabetes Mellitus, Diagnosis, Adult Type 2 diabetes (type 2 diabetes mellitus) is a long-term (chronic) disease. It may happen when there is one or both of these problems: The pancreas does not make enough insulin. The body does not react in a normal way to insulin that it makes. Insulin lets sugars go into cells in your body. If you have type 2 diabetes, sugars cannot get into your cells. Sugars build up in the blood. This causes high blood sugar. What are the causes? The exact cause of this condition is not known. What increases the risk? Having type 2 diabetes in your family. Being overweight or very overweight. Not being active. Your body not reacting in a normal way to the insulin it makes. Having higher than normal blood sugar over time. Having a type of diabetes when you were pregnant. Having a condition that causes small fluid-filled sacs on your ovaries. What are the signs or symptoms? At first, you may have no symptoms. You will get symptoms slowly. They may include: More thirst than normal. More hunger than normal. Needing to pee more than normal. Losing weight without trying. Feeling tired. Feeling weak. Seeing things blurry. Dark patches on your skin. How is this treated? This condition may be treated by a diabetes expert. You may need to: Follow an eating plan made by a food expert (dietitian). Get regular exercise. Find ways to deal with stress. Check blood sugar as often as told. Take medicines. Your doctor will set treatment goals for you. Your blood sugar should be at these levels: Before meals: 80-130 mg/dL (4.7-8.2 mmol/L). After meals: below 180 mg/dL (10 mmol/L). Over the last 2-3 months: less than 7%. Follow these instructions at home: Medicines Take your diabetes medicines or insulin every day. Take medicines as told to help you prevent other problems caused by this condition. You may need: Aspirin. Medicine to lower  cholesterol. Medicine to control blood pressure. Questions to ask your doctor Should I meet with a diabetes educator? What medicines do I need, and when should I take them? What will I need to treat my condition at home? When should I check my blood sugar? Where can I find a support group? Who can I call if I have questions? When is my next doctor visit? General instructions Take over-the-counter and prescription medicines only as told by your doctor. Keep all follow-up visits. Where to find more information For help and guidance and more information about diabetes, please go to: American Diabetes Association (ADA): www.diabetes.org American Association of Diabetes Care and Education Specialists (ADCES): www.diabeteseducator.org International Diabetes Federation (IDF): DCOnly.dk Contact a doctor if: Your blood sugar is at or above 240 mg/dL (95.6 mmol/L) for 2 days in a row. You have been sick for 2 days or more, and you are not getting better. You have had a fever for 2 days or more, and you are not getting better. You have any of these problems for more than 6 hours: You cannot eat or drink. You feel like you may vomit. You vomit. You have watery poop (diarrhea). Get help right away if: Your blood sugar is lower than 54 mg/dL (3 mmol/L). You feel mixed up (confused). You have trouble thinking clearly. You have trouble breathing. You have medium or large ketone levels in your pee. These symptoms may be an emergency. Get help right away. Call your local emergency services (911 in the [place] Do not wait to see if the symptoms will go away. Do  not drive yourself to the hospital. Summary Type 2 diabetes is a long-term disease. Your pancreas may not make enough insulin, or your body may not react in a normal way to insulin that it makes. This condition is treated with an eating plan, lifestyle changes, and medicines. Your doctor will set treatment goals for you. These will help  you keep your blood sugar in a healthy range. Keep all follow-up visits. This information is not intended to replace advice given to you by your health care provider. Make sure you discuss any questions you have with your health care provider. Document Revised: 04/04/2020 Document Reviewed: 04/04/2020 Elsevier Patient Education  2024 [employer].

## 2022-11-02 DIAGNOSIS — H401131 Primary open-angle glaucoma, bilateral, mild stage: Secondary | ICD-10-CM | POA: Diagnosis not present

## 2022-11-02 LAB — LIPID PANEL
Chol/HDL Ratio: 3.4 {ratio} (ref 0.0–5.0)
Cholesterol, Total: 141 mg/dL (ref 100–199)
HDL: 41 mg/dL (ref >39)
LDL Chol Calc (NIH): 69 mg/dL (ref 0–99)
Triglycerides: 187 mg/dL — ABNORMAL HIGH (ref 0–149)
VLDL Cholesterol Cal: 31 mg/dL (ref 5–40)

## 2022-11-02 LAB — URIC ACID: Uric Acid: 7.6 mg/dL (ref 3.8–8.4)

## 2022-11-02 LAB — HM DIABETES EYE EXAM

## 2022-11-02 LAB — HEMOGLOBIN A1C
Est. average glucose Bld gHb Est-mCnc: 128 mg/dL
Hgb A1c MFr Bld: 6.1 % — ABNORMAL HIGH (ref 4.8–5.6)

## 2022-11-19 ENCOUNTER — Encounter: Payer: Self-pay | Admitting: Internal Medicine

## 2022-11-21 ENCOUNTER — Other Ambulatory Visit: Payer: Self-pay | Admitting: Internal Medicine

## 2022-11-21 MED ORDER — AZITHROMYCIN 500 MG PO TABS: ORAL_TABLET | ORAL | 0 refills | Status: DC

## 2022-11-22 ENCOUNTER — Other Ambulatory Visit: Payer: Self-pay | Admitting: Internal Medicine

## 2022-11-23 ENCOUNTER — Other Ambulatory Visit: Payer: Self-pay | Admitting: Internal Medicine

## 2022-11-23 MED ORDER — MEFLOQUINE HCL 250 MG PO TABS: 250.0000 mg | ORAL_TABLET | ORAL | 0 refills | Status: DC

## 2023-01-10 DIAGNOSIS — H2513 Age-related nuclear cataract, bilateral: Secondary | ICD-10-CM | POA: Diagnosis not present

## 2023-01-10 DIAGNOSIS — H40053 Ocular hypertension, bilateral: Secondary | ICD-10-CM | POA: Diagnosis not present

## 2023-01-10 DIAGNOSIS — H5203 Hypermetropia, bilateral: Secondary | ICD-10-CM | POA: Diagnosis not present

## 2023-01-15 ENCOUNTER — Other Ambulatory Visit: Payer: Self-pay | Admitting: Internal Medicine

## 2023-01-15 DIAGNOSIS — F5104 Psychophysiologic insomnia: Secondary | ICD-10-CM

## 2023-02-07 ENCOUNTER — Other Ambulatory Visit: Payer: Self-pay | Admitting: Cardiovascular Disease

## 2023-02-07 ENCOUNTER — Other Ambulatory Visit: Payer: Self-pay | Admitting: Internal Medicine

## 2023-02-07 DIAGNOSIS — I480 Paroxysmal atrial fibrillation: Secondary | ICD-10-CM

## 2023-02-07 NOTE — Telephone Encounter (Signed)
Prescription refill request for Eliquis received. Indication: PAF Last office visit: 06/08/22  Lavell Islam MD Scr: 0.97 on 07/23/22  Epic Age: 78 Weight: 72.4kg  Based on above findings Eliquis 5mg  twice daily is the appropriate dose.  Refill approved.

## 2023-02-12 ENCOUNTER — Encounter: Payer: Self-pay | Admitting: Internal Medicine

## 2023-02-12 ENCOUNTER — Ambulatory Visit (INDEPENDENT_AMBULATORY_CARE_PROVIDER_SITE_OTHER): Payer: Medicare PPO | Admitting: Internal Medicine

## 2023-02-12 VITALS — BP 110/76 | HR 59 | Temp 97.9°F | Ht 69.0 in | Wt 155.0 lb

## 2023-02-12 DIAGNOSIS — I700 Atherosclerosis of aorta: Secondary | ICD-10-CM | POA: Insufficient documentation

## 2023-02-12 DIAGNOSIS — Z0000 Encounter for general adult medical examination without abnormal findings: Secondary | ICD-10-CM

## 2023-02-12 DIAGNOSIS — R7309 Other abnormal glucose: Secondary | ICD-10-CM

## 2023-02-12 DIAGNOSIS — D6869 Other thrombophilia: Secondary | ICD-10-CM

## 2023-02-12 DIAGNOSIS — F1010 Alcohol abuse, uncomplicated: Secondary | ICD-10-CM

## 2023-02-12 DIAGNOSIS — F4321 Adjustment disorder with depressed mood: Secondary | ICD-10-CM

## 2023-02-12 DIAGNOSIS — I480 Paroxysmal atrial fibrillation: Secondary | ICD-10-CM

## 2023-02-12 DIAGNOSIS — F5104 Psychophysiologic insomnia: Secondary | ICD-10-CM | POA: Diagnosis not present

## 2023-02-12 DIAGNOSIS — Z2821 Immunization not carried out because of patient refusal: Secondary | ICD-10-CM | POA: Diagnosis not present

## 2023-02-12 LAB — POCT URINALYSIS DIPSTICK
Bilirubin, UA: NEGATIVE
Blood, UA: NEGATIVE
Glucose, UA: NEGATIVE
Ketones, UA: NEGATIVE
Leukocytes, UA: NEGATIVE
Nitrite, UA: NEGATIVE
Protein, UA: NEGATIVE
Spec Grav, UA: 1.025 (ref 1.010–1.025)
Urobilinogen, UA: 0.2 U/dL
pH, UA: 5.5 (ref 5.0–8.0)

## 2023-02-12 NOTE — Progress Notes (Signed)
I,Jameka J Llittleton, CMA,acting as a Neurosurgeon for [name], MD.,have documented all relevant documentation on the behalf of [name], MD,as directed by  [name], MD while in the presence of [name], MD.  Subjective:   Patient ID: Carlos [name] , male    DOB: Nov 08, 1945 , 78 y.o.   MRN: [id number]  Chief Complaint  Patient presents with   Annual Exam    HPI  He is here today for a full physical examination.   He reports compliance with meds. He denies having any chest pain, shortness of breath and palpitations. He chose to keep clothes on for exam.      Past Medical History:  Diagnosis Date   Allergic rhinitis    Atypical chest pain 09/16/2015   BPPV (benign paroxysmal positional vertigo) 06/08/2022   Dysrhythmia    a-fib/flutter   Elevated blood pressure 10/19/2015   GERD (gastroesophageal reflux disease)    Hypertension    Insomnia    Left inguinal hernia    Mixed hyperlipidemia    Pure hypercholesterolemia 10/02/2018   PVC (premature ventricular contraction) 10/19/2015   Typical atrial flutter (HCC) 10/02/2018   Wheezing 06/08/2022     Family History  Problem Relation Age of Onset   Healthy Mother    Heart attack Father    Pulmonary embolism Brother      Current Outpatient Medications:    apixaban (ELIQUIS) 5 MG TABS tablet, Take 1 tablet by mouth twice daily, Disp: 180 tablet, Rfl: 1   Blood Glucose Monitoring Suppl (TRUE METRIX GO GLUCOSE METER) w/Device KIT, USE TO CHECK BLOOD SUGARS ONCE DAILY., Disp: 1 kit, Rfl: 1   CALCIUM PO, Take 1 tablet by mouth daily. 600 mg daily, Disp: , Rfl:    Cholecalciferol (VITAMIN D PO), Take 1 capsule by mouth daily. , Disp: , Rfl:    MAGNESIUM PO, Take by mouth., Disp: , Rfl:    metoprolol tartrate (LOPRESSOR) 50 MG tablet, Take 1 tablet by mouth twice daily, Disp: 180 tablet, Rfl: 0   Multiple Vitamin (MULTIVITAMIN) capsule, Take 1 capsule by mouth daily., Disp: , Rfl:    pantoprazole (PROTONIX)  40 MG tablet, Take 1 tablet (40 mg total) by mouth 2 (two) times daily., Disp: 180 tablet, Rfl: 3   rosuvastatin (CRESTOR) 10 MG tablet, Take 1 tablet by mouth once daily, Disp: 90 tablet, Rfl: 1   zolpidem (AMBIEN) 10 MG tablet, TAKE 1 TABLET BY MOUTH AT BEDTIME AS NEEDED, Disp: 60 tablet, Rfl: 1   azithromycin (ZITHROMAX) 500 MG tablet, Take 1 tablet daily for 3 days prn travelers diarreah (Patient not taking: Reported on 02/12/2023), Disp: 6 tablet, Rfl: 0   colchicine 0.6 MG tablet, One tab po twice daily as needed for gout, do not take for more than 5 days at a time (Patient not taking: Reported on 02/12/2023), Disp: 30 tablet, Rfl: 2   No Known Allergies   Men's preventive visit. Patient Health Questionnaire (PHQ-2) is  Flowsheet Row Office Visit from 02/12/2023 in [facility]  PHQ-2 Total Score 3     . Patient is on a heart healthy diet. Marital status: Divorced. Relevant history for alcohol use is:  Social History   Substance and Sexual Activity  Alcohol Use Yes   Comment: 10-12 drinks per week  . Relevant history for tobacco use is:  Social History   Tobacco Use  Smoking Status Former   Current packs/day: 0.00   Average  packs/day: 0.5 packs/day for 9.0 years (4.5 ttl pk-yrs)   Types: Cigarettes   Start date: 01/22/1997   Quit date: 01/22/2006   Years since quitting: 17.0  Smokeless Tobacco Never  .   Review of Systems  Constitutional: Negative.   HENT: Negative.    Eyes: Negative.   Respiratory: Negative.    Cardiovascular: Negative.   Gastrointestinal: Negative.   Endocrine: Negative.   Genitourinary: Negative.   Musculoskeletal: Negative.   Skin: Negative.   Neurological: Negative.   Hematological: Negative.   Psychiatric/Behavioral: Negative.       Today's Vitals   02/12/23 0933  BP: 110/76  Pulse: (!) 59  Temp: 97.9 F (36.6 C)  TempSrc: Oral  Weight: 155 lb (70.3 kg)  Height: 5\' 9"  (1.753 m)  PainSc: 0-No pain    Body mass index is 22.89 kg/m.  Wt Readings from Last 3 Encounters:  02/12/23 155 lb (70.3 kg)  11/01/22 157 lb 12.8 oz (71.6 kg)  07/02/22 158 lb 12.8 oz (72 kg)    Objective:  Physical Exam Vitals and nursing note reviewed.  Constitutional:      Appearance: Normal appearance.     Comments: Pt stayed fully clothed for exam  HENT:     Head: Normocephalic and atraumatic.     Right Ear: Tympanic membrane, ear canal and external ear normal.     Left Ear: Tympanic membrane, ear canal and external ear normal.     Nose: Nose normal.     Mouth/Throat:     Mouth: Mucous membranes are moist.     Pharynx: Oropharynx is clear.  Eyes:     Extraocular Movements: Extraocular movements intact.     Conjunctiva/sclera: Conjunctivae normal.     Pupils: Pupils are equal, round, and reactive to light.  Cardiovascular:     Rate and Rhythm: Normal rate and regular rhythm.     Pulses: Normal pulses.     Heart sounds: Normal heart sounds.  Pulmonary:     Effort: Pulmonary effort is normal.     Breath sounds: Normal breath sounds.  Chest:  Breasts:    Right: Normal. No swelling, bleeding, inverted nipple, mass or nipple discharge.     Left: Normal. No swelling, bleeding, inverted nipple, mass or nipple discharge.  Abdominal:     General: Abdomen is flat. Bowel sounds are normal.     Palpations: Abdomen is soft.  Genitourinary:    Comments: Deferred  Musculoskeletal:        General: Normal range of motion.     Cervical back: Normal range of motion and neck supple.  Neurological:     General: No focal deficit present.     Mental Status: He is alert.  Psychiatric:        Mood and Affect: Mood normal.        Behavior: Behavior normal.         Assessment And Plan:    Encounter for general adult medical examination w/o abnormal findings Assessment & Plan: A full exam was performed.  DRE deferred. He is advised to get 30-45 minutes of regular exercise, no less than four to five days  per week. Both weight-bearing and aerobic exercises are recommended.  He is advised to follow a healthy diet with at least six fruits/veggies per day, decrease intake of red meat and other saturated fats and to increase fish intake to twice weekly.  Meats/fish should not be fried -- baked, boiled or broiled is preferable. It is also important  to cut back on your sugar intake.  Be sure to read labels - try to avoid anything with added sugar, high fructose corn syrup or other sweeteners.  If you must use a sweetener, you can try stevia or monkfruit.  It is also important to avoid artificially sweetened foods/beverages and diet drinks. Lastly, wear SPF 50 sunscreen on exposed skin and when in direct sunlight for an extended period of time.  Be sure to avoid fast food restaurants and aim for at least 60 ounces of water daily.       PAF (paroxysmal atrial fibrillation) (HCC) Assessment & Plan: Chronic, EKG performed, SB today. Pt advised he is not in afib today. He is rate controlled with metoprolol and anticoagulated with Eliquis. He will f/u in six months.  He is encouraged to keep all upcoming Cardiology appts.   Orders: -     CBC -     EKG 12-Lead  Aortic atherosclerosis (HCC) Assessment & Plan: Chronic, LDL goal is less than 70.  He will continue with rosuvastatin 10mg  daily. He will f/u in six months.   Orders: -     CMP14+EGFR -     Lipid panel  Chronic insomnia Assessment & Plan: Chronic, he has been taking Ambien. Due to his alcohol use, I will d/c this medication. He may benefit from Seroquel nightly. He was advised of contraindication to drinking alcohol while on sedative hypnotic. He will try melatonin nightly for now. He was also advised that ETOH disrupts sleep pattern. He is encouraged to cut back to no more than one drink per night.    Adjustment disorder with depressed mood Assessment & Plan: He is having difficulty dealing with his son who has mental health disorders. He  agrees to Psych referral. He would likely also benefit from therapy.   Orders: -     Ambulatory referral to Psychiatry  Other abnormal glucose Assessment & Plan: Previous labs reviewed, his A1c has been elevated in the past. I will check an A1c today. Reminded to avoid refined sugars including sugary drinks/foods and processed meats including bacon, sausages and deli meats.    Orders: -     Hemoglobin A1c -     Microalbumin / creatinine urine ratio -     POCT urinalysis dipstick  Acquired thrombophilia (HCC) Assessment & Plan: He is properly anticoagulated with Eliquis due to underlying atrial fibrillation.    Alcohol use disorder, mild, abuse -     Ambulatory referral to Psychology  COVID-19 vaccination declined  Herpes zoster vaccination declined   Return in 6 months (on 08/12/2023), or med check, for 1 year physical. Patient was given opportunity to ask questions. Patient verbalized understanding of the plan and was able to repeat key elements of the plan. All questions were answered to their satisfaction.   I, [name], MD, have reviewed all documentation for this visit. The documentation on 02/12/23 for the exam, diagnosis, procedures, and orders are all accurate and complete.

## 2023-02-12 NOTE — Assessment & Plan Note (Signed)
Chronic, LDL goal is less than 70.  He will continue with rosuvastatin 10mg  daily. He will f/u in six months.

## 2023-02-12 NOTE — Patient Instructions (Addendum)
Remfresh  Align - probiotic

## 2023-02-12 NOTE — Assessment & Plan Note (Addendum)
Chronic, he has been taking Ambien. Due to his alcohol use, I will d/c this medication. He may benefit from Seroquel nightly. He was advised of contraindication to drinking alcohol while on sedative hypnotic. He will try melatonin nightly for now. He was also advised that ETOH disrupts sleep pattern. He is encouraged to cut back to no more than one drink per night.

## 2023-02-12 NOTE — Assessment & Plan Note (Addendum)
Chronic, EKG performed, SB today. Pt advised he is not in afib today. He is rate controlled with metoprolol and anticoagulated with Eliquis. He will f/u in six months.  He is encouraged to keep all upcoming Cardiology appts.

## 2023-02-12 NOTE — Assessment & Plan Note (Signed)
A full exam was performed.  DRE deferred.  He is advised to get 30-45 minutes of regular exercise, no less than four to five days per week. Both weight-bearing and aerobic exercises are recommended.  He is advised to follow a healthy diet with at least six fruits/veggies per day, decrease intake of red meat and other saturated fats and to increase fish intake to twice weekly.  Meats/fish should not be fried -- baked, boiled or broiled is preferable. It is also important to cut back on your sugar intake.  Be sure to read labels - try to avoid anything with added sugar, high fructose corn syrup or other sweeteners.  If you must use a sweetener, you can try stevia or monkfruit.  It is also important to avoid artificially sweetened foods/beverages and diet drinks. Lastly, wear SPF 50 sunscreen on exposed skin and when in direct sunlight for an extended period of time.  Be sure to avoid fast food restaurants and aim for at least 60 ounces of water daily.

## 2023-02-12 NOTE — Assessment & Plan Note (Signed)
He is properly anticoagulated with Eliquis due to underlying atrial fibrillation.

## 2023-02-12 NOTE — Assessment & Plan Note (Signed)
He is having difficulty dealing with his son who has mental health disorders. He agrees to Psych referral. He would likely also benefit from therapy.

## 2023-02-12 NOTE — Assessment & Plan Note (Signed)
Previous labs reviewed, his A1c has been elevated in the past. I will check an A1c today. Reminded to avoid refined sugars including sugary drinks/foods and processed meats including bacon, sausages and deli meats.     

## 2023-02-13 LAB — CMP14+EGFR
ALT: 16 [IU]/L (ref 0–44)
AST: 15 [IU]/L (ref 0–40)
Albumin: 4.5 g/dL (ref 3.8–4.8)
Alkaline Phosphatase: 94 [IU]/L (ref 44–121)
BUN/Creatinine Ratio: 19 (ref 10–24)
BUN: 19 mg/dL (ref 8–27)
Bilirubin Total: 1 mg/dL (ref 0.0–1.2)
CO2: 26 mmol/L (ref 20–29)
Calcium: 10 mg/dL (ref 8.6–10.2)
Chloride: 99 mmol/L (ref 96–106)
Creatinine, Ser: 1.01 mg/dL (ref 0.76–1.27)
Globulin, Total: 2.7 g/dL (ref 1.5–4.5)
Glucose: 113 mg/dL — ABNORMAL HIGH (ref 70–99)
Potassium: 5.2 mmol/L (ref 3.5–5.2)
Sodium: 138 mmol/L (ref 134–144)
Total Protein: 7.2 g/dL (ref 6.0–8.5)
eGFR: 77 mL/min/{1.73_m2} (ref >59)

## 2023-02-13 LAB — MICROALBUMIN / CREATININE URINE RATIO
Creatinine, Urine: 151.9 mg/dL
Microalb/Creat Ratio: 9 mg/g{creat} (ref 0–29)
Microalbumin, Urine: 13.2 ug/mL

## 2023-02-13 LAB — CBC
Hematocrit: 47.7 % (ref 37.5–51.0)
Hemoglobin: 15.7 g/dL (ref 13.0–17.7)
MCH: 29.2 pg (ref 26.6–33.0)
MCHC: 32.9 g/dL (ref 31.5–35.7)
MCV: 89 fL (ref 79–97)
Platelets: 306 10*3/uL (ref 150–450)
RBC: 5.38 x10E6/uL (ref 4.14–5.80)
RDW: 12.8 % (ref 11.6–15.4)
WBC: 6.8 10*3/uL (ref 3.4–10.8)

## 2023-02-13 LAB — LIPID PANEL
Chol/HDL Ratio: 3.5 {ratio} (ref 0.0–5.0)
Cholesterol, Total: 146 mg/dL (ref 100–199)
HDL: 42 mg/dL (ref >39)
LDL Chol Calc (NIH): 82 mg/dL (ref 0–99)
Triglycerides: 124 mg/dL (ref 0–149)
VLDL Cholesterol Cal: 22 mg/dL (ref 5–40)

## 2023-02-13 LAB — HEMOGLOBIN A1C
Est. average glucose Bld gHb Est-mCnc: 131 mg/dL
Hgb A1c MFr Bld: 6.2 % — ABNORMAL HIGH (ref 4.8–5.6)

## 2023-04-11 ENCOUNTER — Encounter: Payer: Self-pay | Admitting: Internal Medicine

## 2023-05-01 ENCOUNTER — Ambulatory Visit: Payer: Medicare PPO

## 2023-05-01 VITALS — BP 118/70 | HR 78 | Temp 97.5°F | Wt 162.0 lb

## 2023-05-01 DIAGNOSIS — Z0000 Encounter for general adult medical examination without abnormal findings: Secondary | ICD-10-CM

## 2023-05-01 NOTE — Progress Notes (Signed)
 Subjective:   Veryl Winemiller is a 78 y.o. who presents for a Medicare Wellness preventive visit.  Visit Complete: In person    Persons Participating in Visit: Patient.  AWV Questionnaire: No: Patient Medicare AWV questionnaire was not completed prior to this visit.  Cardiac Risk Factors include: advanced age (>33men, >68 women);male gender     Objective:    Today's Vitals   05/01/23 1214  BP: 118/70  Pulse: 78  Temp: (!) 97.5 F (36.4 C)  TempSrc: Oral  SpO2: 99%  Weight: 162 lb (73.5 kg)   Body mass index is 23.92 kg/m.     05/01/2023   12:21 PM 04/26/2022    9:58 AM 09/30/2021    1:50 PM 04/06/2021    2:05 PM 06/02/2020   10:43 AM 03/23/2020   12:19 PM 07/14/2019    2:47 PM  Advanced Directives  Does Patient Have a Medical Advance Directive? Yes Yes No Yes No Yes No  Type of Estate agent of [place];Living will Healthcare Power of [place];Living will  Healthcare Power of [place];Living will  Healthcare Power of [place];Living will   Copy of Healthcare Power of Attorney in Chart? No - copy requested No - copy requested  No - copy requested  No - copy requested   Would patient like information on creating a medical advance directive?     No - Patient declined      Current Medications (verified) Outpatient Encounter Medications as of 05/01/2023  Medication Sig   apixaban (ELIQUIS) 5 MG TABS tablet Take 1 tablet by mouth twice daily   CALCIUM PO Take 1 tablet by mouth daily. 600 mg daily   Cholecalciferol (VITAMIN D PO) Take 1 capsule by mouth daily.    MAGNESIUM PO Take by mouth.   metoprolol tartrate (LOPRESSOR) 50 MG tablet Take 1 tablet by mouth twice daily   Multiple Vitamin (MULTIVITAMIN) capsule Take 1 capsule by mouth daily.   pantoprazole (PROTONIX) 40 MG tablet Take 1 tablet (40 mg total) by mouth 2 (two) times daily.   rosuvastatin (CRESTOR) 10 MG tablet Take 1 tablet by mouth once daily   zolpidem (AMBIEN) 10 MG tablet TAKE 1 TABLET  BY MOUTH AT BEDTIME AS NEEDED   azithromycin (ZITHROMAX) 500 MG tablet Take 1 tablet daily for 3 days prn travelers diarreah (Patient not taking: Reported on 05/01/2023)   Blood Glucose Monitoring Suppl (TRUE METRIX GO GLUCOSE METER) w/Device KIT USE TO CHECK BLOOD SUGARS ONCE DAILY. (Patient not taking: Reported on 05/01/2023)   colchicine 0.6 MG tablet One tab po twice daily as needed for gout, do not take for more than 5 days at a time (Patient not taking: Reported on 05/01/2023)   No facility-administered encounter medications on file as of 05/01/2023.    Allergies (verified) Patient has no known allergies.   History: Past Medical History:  Diagnosis Date   Allergic rhinitis    Atypical chest pain 09/16/2015   BPPV (benign paroxysmal positional vertigo) 06/08/2022   Dysrhythmia    a-fib/flutter   Elevated blood pressure 10/19/2015   GERD (gastroesophageal reflux disease)    Hypertension    Insomnia    Left inguinal hernia    Mixed hyperlipidemia    Pure hypercholesterolemia 10/02/2018   PVC (premature ventricular contraction) 10/19/2015   Typical atrial flutter (HCC) 10/02/2018   Wheezing 06/08/2022   Past Surgical History:  Procedure Laterality Date   ASD REPAIR  1994   INGUINAL HERNIA REPAIR  1994   INGUINAL HERNIA  REPAIR Left 09/03/2018   Procedure: LEFT INGUINAL HERNIA REPAIR WITH MESH;  Surgeon: Manus Rudd, MD;  Location: [facility];  Service: General;  Laterality: Left;   Family History  Problem Relation Age of Onset   Healthy Mother    Heart attack Father    Pulmonary embolism Brother    Social History   Socioeconomic History   Marital status: Divorced    Spouse name: Not on file   Number of children: Not on file   Years of education: Not on file   Highest education level: Not on file  Occupational History   Occupation: retired  Tobacco Use   Smoking status: Former    Current packs/day: 0.00    Average packs/day: 0.5 packs/day for 9.0 years  (4.5 ttl pk-yrs)    Types: Cigarettes    Start date: 01/22/1997    Quit date: 01/22/2006    Years since quitting: 17.2   Smokeless tobacco: Never  Vaping Use   Vaping status: Never Used  Substance and Sexual Activity   Alcohol use: Not Currently    Comment: not in last 3 months   Drug use: No   Sexual activity: Not Currently  Other Topics Concern   Not on file  Social History Narrative   Drinks 1-2 cups of caffeine daily.   Social Drivers of Corporate investment banker Strain: Low Risk  (05/01/2023)   Overall Financial Resource Strain (CARDIA)    Difficulty of Paying Living Expenses: Not hard at all  Food Insecurity: No Food Insecurity (05/01/2023)   Hunger Vital Sign    Worried About Running Out of Food in the Last Year: Never true    Ran Out of Food in the Last Year: Never true  Transportation Needs: No Transportation Needs (05/01/2023)   PRAPARE - Administrator, Civil Service (Medical): No    Lack of Transportation (Non-Medical): No  Physical Activity: Sufficiently Active (05/01/2023)   Exercise Vital Sign    Days of Exercise per Week: 5 days    Minutes of Exercise per Session: 50 min  Stress: No Stress Concern Present (05/01/2023)   [employer] of Occupational Health - Occupational Stress Questionnaire    Feeling of Stress : Only a little  Social Connections: Socially Isolated (05/01/2023)   Social Connection and Isolation Panel [NHANES]    Frequency of Communication with Friends and Family: More than three times a week    Frequency of Social Gatherings with Friends and Family: Once a week    Attends Religious Services: Never    Database administrator or Organizations: No    Attends Engineer, structural: Never    Marital Status: Divorced    Tobacco Counseling Counseling given: Not Answered    Clinical Intake:  Pre-visit preparation completed: Yes  Pain : No/denies pain     Nutritional Status: BMI of 19-24  Normal Nutritional Risks:  None Diabetes: No  Lab Results  Component Value Date   HGBA1C 6.2 (H) 02/12/2023   HGBA1C 6.1 (H) 11/01/2022   HGBA1C 6.4 (H) 07/23/2022     How often do you need to have someone help you when you read instructions, pamphlets, or other written materials from your doctor or pharmacy?: 1 - Never  Interpreter Needed?: No  Information entered by :: NAllen LPN   Activities of Daily Living     05/01/2023   12:16 PM  In your present state of health, do you have any difficulty performing the  following activities:  Hearing? 0  Vision? 0  Difficulty concentrating or making decisions? 0  Walking or climbing stairs? 0  Dressing or bathing? 0  Doing errands, shopping? 0  Preparing Food and eating ? N  Using the Toilet? N  In the past six months, have you accidently leaked urine? N  Do you have problems with loss of bowel control? N  Managing your Medications? N  Managing your Finances? N  Housekeeping or managing your Housekeeping? N    Patient Care Team: Dorothyann Peng, MD as PCP - General (Internal Medicine) Chilton Si, MD as PCP - Cardiology (Cardiology)  Indicate any recent Medical Services you may have received from other than [facility] providers in the past year (date may be approximate).     Assessment:   This is a routine wellness examination for Karsyn.  Hearing/Vision screen Hearing Screening - Comments:: Denies hearing issues Vision Screening - Comments:: Regular eye exams, [facility]   Goals Addressed             This Visit's Progress    Patient Stated       05/01/2023, denies goals       Depression Screen     05/01/2023   12:23 PM 02/12/2023    9:34 AM 11/01/2022    8:59 AM 04/26/2022    9:59 AM 08/16/2021   12:27 PM 04/06/2021    2:06 PM 03/23/2020   12:20 PM  PHQ 2/9 Scores  PHQ - 2 Score 0 3 0 0 0 0 0  PHQ- 9 Score 0 9 0        Fall Risk     05/01/2023   12:22 PM 02/12/2023    9:34 AM 11/01/2022    8:59 AM 04/26/2022    9:58 AM 08/16/2021    12:27 PM  Fall Risk   Falls in the past year? 0 0 0 0 0  Number falls in past yr: 0 0 0 0 0  Injury with Fall? 0 0 0 0 0  Risk for fall due to : Medication side effect No Fall Risks No Fall Risks Medication side effect No Fall Risks  Follow up Falls prevention discussed;Falls evaluation completed Falls evaluation completed Falls evaluation completed Falls prevention discussed;Education provided;Falls evaluation completed Falls evaluation completed    MEDICARE RISK AT HOME:  Medicare Risk at Home Any stairs in or around the home?: Yes If so, are there any without handrails?: No Home free of loose throw rugs in walkways, pet beds, electrical cords, etc?: Yes Adequate lighting in your home to reduce risk of falls?: Yes Life alert?: No Use of a cane, walker or w/c?: No Grab bars in the bathroom?: No Shower chair or bench in shower?: No Elevated toilet seat or a handicapped toilet?: Yes  TIMED UP AND GO:  Was the test performed?  Yes  Length of time to ambulate 10 feet: 5 sec Gait steady and fast without use of assistive device  Cognitive Function: 6CIT completed        05/01/2023   12:24 PM 04/26/2022    9:59 AM 04/06/2021    2:07 PM 03/23/2020   12:21 PM 02/19/2019    2:22 PM  6CIT Screen  What Year? 0 points 0 points 0 points 0 points 0 points  What month? 0 points 0 points 0 points 0 points 0 points  What time? 0 points 0 points 0 points 0 points 0 points  Count back from 20 0 points 0 points 0  points 0 points 0 points  Months in reverse 0 points 0 points 0 points 0 points 0 points  Repeat phrase 0 points 2 points 0 points 2 points 2 points  Total Score 0 points 2 points 0 points 2 points 2 points    Immunizations Immunization History  Administered Date(s) Administered   PFIZER(Purple Top)SARS-COV-2 Vaccination 03/08/2019, 03/31/2019, 01/27/2020, 04/29/2020   Pfizer Covid-19 Vaccine Bivalent Booster 6yrs & up 11/01/2020   Tdap 02/01/2018   Unspecified SARS-COV-2  Vaccination 09/06/2019    Screening Tests Health Maintenance  Topic Date Due   Pneumonia Vaccine 82+ Years old (1 of 2 - PCV) Never done   COVID-19 Vaccine (7 - 2024-25 season) 09/23/2022   Zoster Vaccines- Shingrix (1 of 2) 05/13/2023 (Originally 11/17/1964)   HEMOGLOBIN A1C  08/12/2023   INFLUENZA VACCINE  08/23/2023   FOOT EXAM  11/01/2023   OPHTHALMOLOGY EXAM  11/02/2023   Diabetic kidney evaluation - eGFR measurement  02/12/2024   Diabetic kidney evaluation - Urine ACR  02/12/2024   Medicare Annual Wellness (AWV)  04/30/2024   DTaP/Tdap/Td (2 - Td or Tdap) 02/02/2028   Hepatitis C Screening  Completed   HPV VACCINES  Aged Out   Colonoscopy  Discontinued    Health Maintenance  Health Maintenance Due  Topic Date Due   Pneumonia Vaccine 29+ Years old (1 of 2 - PCV) Never done   COVID-19 Vaccine (7 - 2024-25 season) 09/23/2022   Health Maintenance Items Addressed: Up to date  Additional Screening:  Vision Screening: Recommended annual ophthalmology exams for early detection of glaucoma and other disorders of the eye.  Dental Screening: Recommended annual dental exams for proper oral hygiene  Community Resource Referral / Chronic Care Management: CRR required this visit?  No   CCM required this visit?  No     Plan:     I have personally reviewed and noted the following in the patient's chart:   Medical and social history Use of alcohol, tobacco or illicit drugs  Current medications and supplements including opioid prescriptions. Patient is not currently taking opioid prescriptions. Functional ability and status Nutritional status Physical activity Advanced directives List of other physicians Hospitalizations, surgeries, and ER visits in previous 12 months Vitals Screenings to include cognitive, depression, and falls Referrals and appointments  In addition, I have reviewed and discussed with patient certain preventive protocols, quality metrics, and best  practice recommendations. A written personalized care plan for preventive services as well as general preventive health recommendations were provided to patient.     Barb Merino, LPN   01/27/1094   After Visit Summary: (In Person-Printed) AVS printed and given to the patient  Notes: Nothing significant to report at this time.

## 2023-05-01 NOTE — Patient Instructions (Signed)
 Carlos [name] , Thank you for taking time to come for your Medicare Wellness Visit. I appreciate your ongoing commitment to your health goals. Please review the following plan we discussed and let me know if I can assist you in the future.   Referrals/Orders/Follow-Ups/Clinician Recommendations: none  This is a list of the screening recommended for you and due dates:  Health Maintenance  Topic Date Due   Zoster (Shingles) Vaccine (1 of 2) 05/13/2023*   Pneumonia Vaccine (1 of 2 - PCV) 04/30/2024*   Hemoglobin A1C  08/12/2023   Flu Shot  08/23/2023   COVID-19 Vaccine (8 - Mixed Product risk 2024-25 season) 10/10/2023   Complete foot exam   11/01/2023   Eye exam for diabetics  11/02/2023   Yearly kidney function blood test for diabetes  02/12/2024   Yearly kidney health urinalysis for diabetes  02/12/2024   Medicare Annual Wellness Visit  04/30/2024   DTaP/Tdap/Td vaccine (2 - Td or Tdap) 02/02/2028   Hepatitis C Screening  Completed   HPV Vaccine  Aged Out   Colon Cancer Screening  Discontinued  *Topic was postponed. The date shown is not the original due date.    Advanced directives: (Copy Requested) Please bring a copy of your health care power of attorney and living will to the office to be added to your chart at your convenience. You can mail to [facility] [address] [place], [place] [address] or email to [email or web address]  Next Medicare Annual Wellness Visit scheduled for next year: Yes  insert Preventive Care attachment Insert FALL PREVENTION attachment if needed

## 2023-05-07 ENCOUNTER — Other Ambulatory Visit: Payer: Self-pay | Admitting: Internal Medicine

## 2023-05-15 ENCOUNTER — Other Ambulatory Visit: Payer: Self-pay | Admitting: Internal Medicine

## 2023-05-15 DIAGNOSIS — F5104 Psychophysiologic insomnia: Secondary | ICD-10-CM

## 2023-05-16 ENCOUNTER — Other Ambulatory Visit: Payer: Self-pay | Admitting: Internal Medicine

## 2023-05-16 DIAGNOSIS — F5104 Psychophysiologic insomnia: Secondary | ICD-10-CM

## 2023-05-23 ENCOUNTER — Encounter (HOSPITAL_BASED_OUTPATIENT_CLINIC_OR_DEPARTMENT_OTHER): Payer: Self-pay | Admitting: Cardiovascular Disease

## 2023-07-04 ENCOUNTER — Encounter (HOSPITAL_BASED_OUTPATIENT_CLINIC_OR_DEPARTMENT_OTHER): Payer: Self-pay | Admitting: Cardiovascular Disease

## 2023-07-04 ENCOUNTER — Ambulatory Visit (INDEPENDENT_AMBULATORY_CARE_PROVIDER_SITE_OTHER): Payer: Medicare PPO | Admitting: Cardiovascular Disease

## 2023-07-04 VITALS — BP 108/76 | HR 90 | Ht 70.5 in | Wt 159.0 lb

## 2023-07-04 DIAGNOSIS — R011 Cardiac murmur, unspecified: Secondary | ICD-10-CM | POA: Diagnosis not present

## 2023-07-04 DIAGNOSIS — E1169 Type 2 diabetes mellitus with other specified complication: Secondary | ICD-10-CM | POA: Diagnosis not present

## 2023-07-04 DIAGNOSIS — I493 Ventricular premature depolarization: Secondary | ICD-10-CM | POA: Diagnosis not present

## 2023-07-04 DIAGNOSIS — I483 Typical atrial flutter: Secondary | ICD-10-CM | POA: Diagnosis not present

## 2023-07-04 DIAGNOSIS — I480 Paroxysmal atrial fibrillation: Secondary | ICD-10-CM

## 2023-07-04 DIAGNOSIS — I700 Atherosclerosis of aorta: Secondary | ICD-10-CM | POA: Diagnosis not present

## 2023-07-04 DIAGNOSIS — E785 Hyperlipidemia, unspecified: Secondary | ICD-10-CM

## 2023-07-04 NOTE — Patient Instructions (Signed)
 Medication Instructions:   Your physician recommends that you continue on your current medications as directed. Please refer to the Current Medication list given to you today.   *If you need a refill on your cardiac medications before your next appointment, please call your pharmacy*  Lab Work:  Please get fasting lipid at your PCP and fax to [phone] Attention Dr. Theodis Fiscal.  If you have labs (blood work) drawn today and your tests are completely normal, you will receive your results only by: MyChart Message (if you have MyChart) OR A paper copy in the mail If you have any lab test that is abnormal or we need to change your treatment, we will call you to review the results.  Testing/Procedures:  Your physician has requested that you have an echocardiogram. Echocardiography is a painless test that uses sound waves to create images of your heart. It provides your doctor with information about the size and shape of your heart and how well your heart's chambers and valves are working. This procedure takes approximately one hour. There are no restrictions for this procedure. Please do NOT wear cologne, perfume, aftershave, or lotions (deodorant is allowed). Please arrive 15 minutes prior to your appointment time.    Follow-Up: At [facility], you and your health needs are our priority.  As part of our continuing mission to provide you with exceptional heart care, our providers are all part of one team.  This team includes your primary Cardiologist (physician) and Advanced Practice Providers or APPs (Physician Assistants and Nurse Practitioners) who all work together to provide you with the care you need, when you need it.  Your next appointment:   1 year(s)  Provider:   [name], MD    We recommend signing up for the patient portal called MyChart.  Sign up information is provided on this After Visit Summary.  MyChart is used to connect with patients for Virtual  Visits (Telemedicine).  Patients are able to view lab/test results, encounter notes, upcoming appointments, etc.  Non-urgent messages can be sent to your provider as well.   To learn more about what you can do with MyChart, go to [email or web address].   Other Instructions  Your physician wants you to follow-up in: 1 year.  You will receive a reminder letter in the mail two months in advance. If you don't receive a letter, please call our office to schedule the follow-up appointment.

## 2023-07-04 NOTE — Progress Notes (Signed)
 Cardiology Office Note:  .   Date:  07/04/2023  ID:  Carlos [name], DOB 11-13-1945, MRN [id number] PCP: [name], MD  [facility] Providers Cardiologist:  [name], MD    History of Present Illness: .    Carlos [name] is a 78 y.o. male with persistent atrial fibrillation, hyperlipidemia and ASD s/p surgical repair who presents for follow up.  He was initially seen 08/2015 due to L sided chest pain.  He was referred for ETT, which was negative for ischemia.  He was noted to have several PVCs but was asymptomatic.  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 [name] on 05/22/17 due to dysphagia.  While there his heart rate was 130 bpm and he was in atrial fibrillation.  He was started on metoprolol  and referred to cardiology.  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  He was also started on Eliquis .     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  He had L inguinal hernia repair 07/2018 with Dr. [name] that went well.  He had some atypical chest pain that was thought to be due to GERD and he was started on a PPI.  Symptoms have improved since making this change.  He had a repeat echo 10/2020 that revealed LVEF 55 to 60% with mild LVH.  Left atrium was mildly dilated and right atrium was moderately dilated.  He had aortic valve sclerosis without stenosis.  At his visit 05/2022 he was following up after an episode of dizziness.  It developed acutely and he was seen in the ED where he had an MRI that was negative.  There was concern for an inner ear infection and it was thought to be vertigo.  He was otherwise doing well.    Discussed the use of AI scribe software for clinical note transcription with the patient, who gave verbal consent to proceed.  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 These symptoms are managed with a stool  softener at night and a substitute for Zantac during the day, which has improved his condition. No pain during physical exertion.  He exercises regularly, attending the gym four to five times a week for 30 to 45 minutes. During exercise, he maintains a heart rate of 104 to 105 bpm and experiences good breathing, though it is slightly challenging as expected. No pain during exercise, swelling, lightheadedness, dizziness, or pain in the legs when walking.  He experiences nocturnal muscle cramps two to three times a week, which improve with increased water intake. He reports adequate daily water intake.  He produces white clear phlegm in the morning after waking up and brushing his teeth, which he does not believe is related to his medication. He also experiences occasional nasal drainage, which he associates with seasonal allergies.  He is currently taking metoprolol  and Eliquis  in the morning for atrial flutter and Crestor  for cholesterol management. He notes an increase in LDL cholesterol from 69 to 82 mg/dL between October and January, attributed to dietary changes during a trip to [place] in November, where he consumed many sweets.   ROS:  As per HPI  Studies Reviewed: .       Echo 10/2020: 1. Left ventricular ejection fraction, by estimation, is 55 to 60%. Left  ventricular ejection fraction by 3D volume is 56 %. The left ventricle has  normal function. The left ventricle has  no regional wall motion  abnormalities. There is mild concentric  left ventricular hypertrophy. Left ventricular diastolic parameters are  indeterminate due to rhythm.   2. Right ventricular systolic function is normal. The right ventricular  size is normal.   3. Left atrial size was mildly dilated.   4. Right atrial size was moderately dilated.   5. The mitral valve is normal in structure. Mild to moderate mitral valve  regurgitation. No evidence of mitral stenosis.   6. The aortic valve is tricuspid. Aortic valve  regurgitation is not  visualized. Mild aortic valve sclerosis is present, with no evidence of  aortic valve stenosis.   7. The inferior vena cava is normal in size with greater than 50%  respiratory variability, suggesting right atrial pressure of 3 mmHg.   Risk Assessment/Calculations:    CHA2DS2-VASc Score = 3   This indicates a 3.2% annual risk of stroke. The patient's score is based upon: CHF History: 0 HTN History: 0 Diabetes History: 0 Stroke History: 0 Vascular Disease History: 1 Age Score: 2 Gender Score: 0            Physical Exam:   VS:  BP 108/76 (BP Location: Right Arm, Patient Position: Sitting, Cuff Size: Normal)   Pulse 90   Ht 5' 10.5 (1.791 m)   Wt 159 lb (72.1 kg)   SpO2 93%   BMI 22.49 kg/m  , BMI Body mass index is 22.49 kg/m. GENERAL:  Well appearing HEENT: Pupils equal round and reactive, fundi not visualized, oral mucosa unremarkable NECK:  No jugular venous distention, waveform within normal limits, carotid upstroke brisk and symmetric, no bruits, no thyromegaly LUNGS:  Clear to auscultation bilaterally HEART:  Irregularly irregular.  PMI not displaced or sustained,S1 and S2 within normal limits, no S3, no S4, no clicks, no rubs, no murmurs ABD:  Flat, positive bowel sounds normal in frequency in pitch, no bruits, no rebound, no guarding, no midline pulsatile mass, no hepatomegaly, no splenomegaly EXT:  2 plus pulses throughout, no edema, no cyanosis no clubbing SKIN:  No rashes no nodules NEURO:  Cranial nerves II through XII grossly intact, motor grossly intact throughout PSYCH:  Cognitively intact, oriented to person place and time   ASSESSMENT AND PLAN: .    Assessment & Plan  # Typical Atrial flutter: Well-controlled on Eliquis  and metoprolol . Recent EKG confirmed atrial flutter. He requested a repeat EKG with modern equipment. - Perform EKG today to assess current status of atrial flutter. - Continue Eliquis  and metoprolol .  # Aortic  atherosclerosis: Aortic plaque noted on previous echocardiogram. Reassessment planned. - Schedule echocardiogram to evaluate aortic valve and plaque status.  # Hyperlipidemia LDL cholesterol increased from 69 to 82. Managed with Crestor . Reassessment planned to evaluate need for medication adjustment. - Recheck lipid panel in September while fasting. - Continue Crestor .  # Gastroesophageal reflux disease (GERD) Intermittent postprandial reflux with left-sided chest pain and morning phlegm. Managed with current medication regimen.  # Allergic rhinitis:  - Consider Flonase  nasal spray, two sprays in the morning.        Dispo: f/u 1 year  Signed, [name], MD

## 2023-07-11 DIAGNOSIS — H40053 Ocular hypertension, bilateral: Secondary | ICD-10-CM | POA: Diagnosis not present

## 2023-07-11 DIAGNOSIS — H2513 Age-related nuclear cataract, bilateral: Secondary | ICD-10-CM | POA: Diagnosis not present

## 2023-07-15 ENCOUNTER — Other Ambulatory Visit: Payer: Self-pay | Admitting: Internal Medicine

## 2023-07-15 DIAGNOSIS — F5104 Psychophysiologic insomnia: Secondary | ICD-10-CM

## 2023-07-23 ENCOUNTER — Other Ambulatory Visit: Payer: Self-pay | Admitting: Cardiovascular Disease

## 2023-07-23 ENCOUNTER — Other Ambulatory Visit: Payer: Self-pay | Admitting: Internal Medicine

## 2023-07-23 DIAGNOSIS — I480 Paroxysmal atrial fibrillation: Secondary | ICD-10-CM

## 2023-07-23 NOTE — Telephone Encounter (Signed)
 Prescription refill request for Eliquis  received. Indication:afib Last office visit:6/25 Scr:1.01  1/25 Age: 78 Weight:72.1  kg  Prescription refilled

## 2023-08-02 ENCOUNTER — Ambulatory Visit (INDEPENDENT_AMBULATORY_CARE_PROVIDER_SITE_OTHER)

## 2023-08-02 DIAGNOSIS — I493 Ventricular premature depolarization: Secondary | ICD-10-CM

## 2023-08-02 DIAGNOSIS — I480 Paroxysmal atrial fibrillation: Secondary | ICD-10-CM | POA: Diagnosis not present

## 2023-08-02 DIAGNOSIS — I700 Atherosclerosis of aorta: Secondary | ICD-10-CM | POA: Diagnosis not present

## 2023-08-02 DIAGNOSIS — I483 Typical atrial flutter: Secondary | ICD-10-CM | POA: Diagnosis not present

## 2023-08-02 DIAGNOSIS — E785 Hyperlipidemia, unspecified: Secondary | ICD-10-CM

## 2023-08-02 DIAGNOSIS — R011 Cardiac murmur, unspecified: Secondary | ICD-10-CM | POA: Diagnosis not present

## 2023-08-02 DIAGNOSIS — E1169 Type 2 diabetes mellitus with other specified complication: Secondary | ICD-10-CM | POA: Diagnosis not present

## 2023-08-02 LAB — ECHOCARDIOGRAM COMPLETE
Area-P 1/2: 3.21 cm2
MV M vel: 4.38 m/s
MV Peak grad: 76.7 mmHg
Radius: 0.5 cm
S' Lateral: 2.93 cm

## 2023-08-03 ENCOUNTER — Encounter (HOSPITAL_BASED_OUTPATIENT_CLINIC_OR_DEPARTMENT_OTHER): Payer: Self-pay | Admitting: Cardiovascular Disease

## 2023-08-08 ENCOUNTER — Ambulatory Visit: Payer: Self-pay | Admitting: Cardiovascular Disease

## 2023-08-08 DIAGNOSIS — I480 Paroxysmal atrial fibrillation: Secondary | ICD-10-CM

## 2023-08-08 DIAGNOSIS — I493 Ventricular premature depolarization: Secondary | ICD-10-CM

## 2023-08-08 DIAGNOSIS — I700 Atherosclerosis of aorta: Secondary | ICD-10-CM

## 2023-08-08 DIAGNOSIS — I483 Typical atrial flutter: Secondary | ICD-10-CM

## 2023-08-09 ENCOUNTER — Other Ambulatory Visit: Payer: Self-pay | Admitting: Internal Medicine

## 2023-08-09 DIAGNOSIS — F5104 Psychophysiologic insomnia: Secondary | ICD-10-CM

## 2023-08-12 ENCOUNTER — Ambulatory Visit: Payer: Medicare PPO | Admitting: Internal Medicine

## 2023-08-12 ENCOUNTER — Encounter: Payer: Self-pay | Admitting: Internal Medicine

## 2023-08-12 VITALS — BP 110/82 | HR 75 | Temp 97.4°F | Ht 70.0 in | Wt 163.2 lb

## 2023-08-12 DIAGNOSIS — F5104 Psychophysiologic insomnia: Secondary | ICD-10-CM

## 2023-08-12 DIAGNOSIS — I483 Typical atrial flutter: Secondary | ICD-10-CM | POA: Diagnosis not present

## 2023-08-12 DIAGNOSIS — E7800 Pure hypercholesterolemia, unspecified: Secondary | ICD-10-CM

## 2023-08-12 DIAGNOSIS — R053 Chronic cough: Secondary | ICD-10-CM

## 2023-08-12 DIAGNOSIS — R413 Other amnesia: Secondary | ICD-10-CM | POA: Insufficient documentation

## 2023-08-12 DIAGNOSIS — R7309 Other abnormal glucose: Secondary | ICD-10-CM

## 2023-08-12 DIAGNOSIS — F4321 Adjustment disorder with depressed mood: Secondary | ICD-10-CM

## 2023-08-12 DIAGNOSIS — I700 Atherosclerosis of aorta: Secondary | ICD-10-CM

## 2023-08-12 DIAGNOSIS — F1090 Alcohol use, unspecified, uncomplicated: Secondary | ICD-10-CM | POA: Diagnosis not present

## 2023-08-12 MED ORDER — PANTOPRAZOLE SODIUM 40 MG PO TBEC: 40.0000 mg | DELAYED_RELEASE_TABLET | Freq: Two times a day (BID) | ORAL | 2 refills | Status: AC

## 2023-08-12 MED ORDER — ROSUVASTATIN CALCIUM 10 MG PO TABS: 10.0000 mg | ORAL_TABLET | Freq: Every day | ORAL | 2 refills | Status: AC

## 2023-08-12 MED ORDER — METOPROLOL TARTRATE 50 MG PO TABS: 50.0000 mg | ORAL_TABLET | Freq: Two times a day (BID) | ORAL | 2 refills | Status: AC

## 2023-08-12 NOTE — Assessment & Plan Note (Signed)
 Previous labs reviewed, his A1c has been elevated in the past. I will check an A1c today. Reminded to avoid refined sugars including sugary drinks/foods and processed meats including bacon, sausages and deli meats.

## 2023-08-12 NOTE — Assessment & Plan Note (Signed)
 Likely exacerbated by PND. Advised to resume Xyzal .

## 2023-08-12 NOTE — Assessment & Plan Note (Signed)
 Chronic, LDL goal is less than 70.  He will continue with rosuvastatin  daily.  - Encouraged to follow heart healthy lifestyle.

## 2023-08-12 NOTE — Assessment & Plan Note (Signed)
 Will perform 6CIT testing. He will consider Neurology evaluation.  However, due to underlying mood, may benefit from Neuropsych testing. Minor lapses in judgment and memory reported. Alcohol use discussed as a factor. - Perform blood tests to check B12 levels and other potential causes of cognitive decline. - Discuss potential referral to a neurologist if blood tests are normal.

## 2023-08-12 NOTE — Assessment & Plan Note (Signed)
 Chronic, he has previously had issues dealing with his son who has mental health issues.  He agrees to Psych referral. He would likely also benefit from therapy.

## 2023-08-12 NOTE — Assessment & Plan Note (Signed)
 Chronic, LDL goal is less than 70.  He will continue with rosuvastatin 10mg  daily. He will f/u in six months.

## 2023-08-12 NOTE — Assessment & Plan Note (Signed)
Chronic, HR irregular today. He will continue with metoprolol and apixaban twice daily as per Cardiology. Most recent Cardiology note was reviewed in detail. He is not having any symptoms at this time .

## 2023-08-12 NOTE — Patient Instructions (Signed)
 Cholesterol Content in Foods Cholesterol is a waxy, fat-like substance that helps to carry fat in the blood. The body needs cholesterol in small amounts, but too much cholesterol can cause damage to the arteries and heart. What foods have cholesterol?  Cholesterol is found in animal-based foods, such as meat, seafood, and dairy. Generally, low-fat dairy and lean meats have less cholesterol than full-fat dairy and fatty meats. The milligrams of cholesterol per serving (mg per serving) of common cholesterol-containing foods are listed below. Meats and other proteins Egg -- one large whole egg has 186 mg. Veal shank -- 4 oz (113 g) has 141 mg. Lean ground [place] (93% lean) -- 4 oz (113 g) has 118 mg. Fat-trimmed lamb loin -- 4 oz (113 g) has 106 mg. Lean ground beef (90% lean) -- 4 oz (113 g) has 100 mg. Lobster -- 3.5 oz (99 g) has 90 mg. Pork loin chops -- 4 oz (113 g) has 86 mg. Canned salmon -- 3.5 oz (99 g) has 83 mg. Fat-trimmed beef top loin -- 4 oz (113 g) has 78 mg. Frankfurter -- 1 frank (3.5 oz or 99 g) has 77 mg. Crab -- 3.5 oz (99 g) has 71 mg. Roasted chicken without skin, white meat -- 4 oz (113 g) has 66 mg. Light bologna -- 2 oz (57 g) has 45 mg. Deli-cut [place] -- 2 oz (57 g) has 31 mg. Canned tuna -- 3.5 oz (99 g) has 31 mg. Tomasa Blase -- 1 oz (28 g) has 29 mg. Oysters and mussels (raw) -- 3.5 oz (99 g) has 25 mg. Mackerel -- 1 oz (28 g) has 22 mg. Trout -- 1 oz (28 g) has 20 mg. Pork sausage -- 1 link (1 oz or 28 g) has 17 mg. Salmon -- 1 oz (28 g) has 16 mg. Tilapia -- 1 oz (28 g) has 14 mg. Dairy Soft-serve ice cream --  cup (4 oz or 86 g) has 103 mg. Whole-milk yogurt -- 1 cup (8 oz or 245 g) has 29 mg. Cheddar cheese -- 1 oz (28 g) has 28 mg. American cheese -- 1 oz (28 g) has 28 mg. Whole milk -- 1 cup (8 oz or 250 mL) has 23 mg. 2% milk -- 1 cup (8 oz or 250 mL) has 18 mg. Cream cheese -- 1 tablespoon (Tbsp) (14.5 g) has 15 mg. Cottage cheese --  cup (4 oz or  113 g) has 14 mg. Low-fat (1%) milk -- 1 cup (8 oz or 250 mL) has 10 mg. Sour cream -- 1 Tbsp (12 g) has 8.5 mg. Low-fat yogurt -- 1 cup (8 oz or 245 g) has 8 mg. Nonfat Greek yogurt -- 1 cup (8 oz or 228 g) has 7 mg. Half-and-half cream -- 1 Tbsp (15 mL) has 5 mg. Fats and oils Cod liver oil -- 1 tablespoon (Tbsp) (13.6 g) has 82 mg. Butter -- 1 Tbsp (14 g) has 15 mg. Lard -- 1 Tbsp (12.8 g) has 14 mg. Bacon grease -- 1 Tbsp (12.9 g) has 14 mg. Mayonnaise -- 1 Tbsp (13.8 g) has 5-10 mg. Margarine -- 1 Tbsp (14 g) has 3-10 mg. The items listed above may not be a complete list of foods with cholesterol. Exact amounts of cholesterol in these foods may vary depending on specific ingredients and brands. Contact a dietitian for more information. What foods do not have cholesterol? Most plant-based foods do not have cholesterol unless you combine them with a food that has  cholesterol. Foods without cholesterol include: Grains and cereals. Vegetables. Fruits. Vegetable oils, such as olive, canola, and sunflower oil. Legumes, such as peas, beans, and lentils. Nuts and seeds. Egg whites. The items listed above may not be a complete list of foods that do not have cholesterol. Contact a dietitian for more information. Summary The body needs cholesterol in small amounts, but too much cholesterol can cause damage to the arteries and heart. Cholesterol is found in animal-based foods, such as meat, seafood, and dairy. Generally, low-fat dairy and lean meats have less cholesterol than full-fat dairy and fatty meats. This information is not intended to replace advice given to you by your health care provider. Make sure you discuss any questions you have with your health care provider. Document Revised: 05/20/2020 Document Reviewed: 05/20/2020 Elsevier Patient Education  2024 [employer].

## 2023-08-12 NOTE — Progress Notes (Signed)
 I,[name], CMA,acting as a Neurosurgeon for Catheryn LOISE Slocumb, MD.,have documented all relevant documentation on the behalf of Catheryn LOISE Slocumb, MD,as directed by  Catheryn LOISE Slocumb, MD while in the presence of Catheryn LOISE Slocumb, MD.  Subjective:  Patient ID: Carlos [name] , male    DOB: 05-11-1945 , 78 y.o.   MRN: [id number]  Chief Complaint  Patient presents with   Hyperlipidemia    Patient presents today for cholesterol follow up. He reports compliance with medications. Denies headache, chest pain & sob.  He states 4/5 days out the week he coughs upon awakening. He sometimes does cough up phlegm. After he coughs a few times & spits up phlegm he does not experience the cough anymore. He wants to know why that is.     HPI Discussed the use of AI scribe software for clinical note transcription with the patient, who gave verbal consent to proceed.  History of Present Illness Carlos [name] is a 78 year old male with hyperlipidemia and atrial flutter who presents for a cholesterol check and general medical follow-up.  He has a history of hyperlipidemia, with his last LDL cholesterol level being 82 mg/dL in January, which should be less than 70 mg/dL due to plaque in the aorta. He takes his medications regularly and incorporates ground flax seeds into his diet for fiber. He exercises about four times a week and engages in yard work.  He is currently taking Eliquis  twice daily, metoprolol  50 mg twice daily, Protonix  daily, rosuvastatin  10 mg, and Ambien . He is not currently taking Xyzal . He reports a morning cough. He is also being treated for reflux.  He reports difficulty in securing an appointment with a psychiatrist, having been told by several offices that they are not accepting new patients. He is open to seeing a different psychiatrist.  No palpitations or fast heartbeat, although he is aware of his atrial flutter diagnosis. He mentions that he only notices his heart rate when measuring  it manually.  He consumes one and a half to two alcoholic drinks daily, typically in the evening with dinner. He recently traveled to the [place] by plane.  His son has expressed concerns about minor lapses in judgment and memory, suggesting a cognitive test might be necessary.      Past Medical History:  Diagnosis Date   Allergic rhinitis    Atypical chest pain 09/16/2015   BPPV (benign paroxysmal positional vertigo) 06/08/2022   Dysrhythmia    a-fib/flutter   Elevated blood pressure 10/19/2015   GERD (gastroesophageal reflux disease)    Hypertension    Insomnia    Left inguinal hernia    Mixed hyperlipidemia    Pure hypercholesterolemia 10/02/2018   PVC (premature ventricular contraction) 10/19/2015   Typical atrial flutter (HCC) 10/02/2018   Wheezing 06/08/2022     Family History  Problem Relation Age of Onset   Healthy Mother    Heart attack Father    Pulmonary embolism Brother      Current Outpatient Medications:    apixaban  (ELIQUIS ) 5 MG TABS tablet, Take 1 tablet by mouth twice daily, Disp: 180 tablet, Rfl: 1   Blood Glucose Monitoring Suppl (TRUE METRIX GO GLUCOSE METER) w/Device KIT, USE TO CHECK BLOOD SUGARS ONCE DAILY., Disp: 1 kit, Rfl: 1   CALCIUM  PO, Take 1 tablet by mouth daily. 600 mg daily, Disp: , Rfl:    Cholecalciferol (VITAMIN D PO), Take 1 capsule by mouth daily. , Disp: , Rfl:  latanoprost (XALATAN) 0.005 % ophthalmic solution, SMARTSIG:In Eye(s), Disp: , Rfl:    MAGNESIUM PO, Take by mouth., Disp: , Rfl:    Multiple Vitamin (MULTIVITAMIN) capsule, Take 1 capsule by mouth daily., Disp: , Rfl:    zolpidem  (AMBIEN ) 10 MG tablet, TAKE 1 TABLET BY MOUTH AT BEDTIME AS NEEDED, Disp: 60 tablet, Rfl: 0   levocetirizine (XYZAL ) 5 MG tablet, TAKE 1 TABLET BY MOUTH ONCE DAILY IN THE EVENING (Patient not taking: Reported on 08/12/2023), Disp: 90 tablet, Rfl: 2   metoprolol  tartrate (LOPRESSOR ) 50 MG tablet, Take 1 tablet (50 mg total) by mouth 2 (two) times  daily., Disp: 180 tablet, Rfl: 2   pantoprazole  (PROTONIX ) 40 MG tablet, Take 1 tablet (40 mg total) by mouth 2 (two) times daily., Disp: 180 tablet, Rfl: 2   rosuvastatin  (CRESTOR ) 10 MG tablet, Take 1 tablet (10 mg total) by mouth daily., Disp: 90 tablet, Rfl: 2   No Known Allergies   Review of Systems  Constitutional: Negative.   Respiratory: Negative.    Cardiovascular: Negative.   Gastrointestinal: Negative.   Genitourinary: Negative.   Skin: Negative.   Allergic/Immunologic: Negative.   Psychiatric/Behavioral: Negative.       Today's Vitals   08/12/23 0927  BP: 110/82  Pulse: 75  Temp: (!) 97.4 F (36.3 C)  SpO2: 98%  Weight: 163 lb 3.2 oz (74 kg)  Height: 5' 10 (1.778 m)   Body mass index is 23.42 kg/m.  Wt Readings from Last 3 Encounters:  08/12/23 163 lb 3.2 oz (74 kg)  07/04/23 159 lb (72.1 kg)  05/01/23 162 lb (73.5 kg)     Objective:  Physical Exam Vitals and nursing note reviewed.  Constitutional:      Appearance: Normal appearance.  HENT:     Head: Normocephalic and atraumatic.  Eyes:     Extraocular Movements: Extraocular movements intact.  Cardiovascular:     Rate and Rhythm: Normal rate. Rhythm irregular.     Heart sounds: Normal heart sounds.  Pulmonary:     Effort: Pulmonary effort is normal.     Breath sounds: Normal breath sounds.  Musculoskeletal:     Cervical back: Normal range of motion.  Skin:    General: Skin is warm.  Neurological:     General: No focal deficit present.     Mental Status: He is alert.  Psychiatric:        Mood and Affect: Mood normal.      Assessment And Plan:  Pure hypercholesterolemia Assessment & Plan: Chronic, LDL goal is less than 70.  He will continue with rosuvastatin  daily.  - Encouraged to follow heart healthy lifestyle.   Orders: -     CMP14+EGFR -     Lipid panel  Typical atrial flutter (HCC) Assessment & Plan: Chronic, HR irregular today. He will continue with metoprolol  and apixaban   twice daily as per Cardiology. Most recent Cardiology note was reviewed in detail. He is not having any symptoms at this time .   Aortic atherosclerosis (HCC) Assessment & Plan: Chronic, LDL goal is less than 70.  He will continue with rosuvastatin  10mg  daily. He will f/u in six months.    Chronic cough Assessment & Plan: Likely exacerbated by PND. Advised to resume Xyzal .    Chronic insomnia -     Ambulatory referral to Psychiatry  Adjustment disorder with depressed mood Assessment & Plan: Chronic, he has previously had issues dealing with his son who has mental health issues.  He agrees  to Psych referral. He would likely also benefit from therapy.   Orders: -     Ambulatory referral to Psychiatry  Other abnormal glucose Assessment & Plan: Previous labs reviewed, his A1c has been elevated in the past. I will check an A1c today. Reminded to avoid refined sugars including sugary drinks/foods and processed meats including bacon, sausages and deli meats.    Orders: -     Hemoglobin A1c  Memory changes Assessment & Plan: Will perform 6CIT testing. He will consider Neurology evaluation.  However, due to underlying mood, may benefit from Neuropsych testing.   Orders: -     Vitamin B12 -     TSH  Other orders -     Metoprolol  Tartrate; Take 1 tablet (50 mg total) by mouth 2 (two) times daily.  Dispense: 180 tablet; Refill: 2 -     Pantoprazole  Sodium; Take 1 tablet (40 mg total) by mouth 2 (two) times daily.  Dispense: 180 tablet; Refill: 2 -     Rosuvastatin  Calcium ; Take 1 tablet (10 mg total) by mouth daily.  Dispense: 90 tablet; Refill: 2   Assessment & Plan Atrial Flutter Atrial flutter confirmed by EKG, managed with metoprolol . - Continue metoprolol  50 mg twice daily. - Repeat echocardiogram in one year.  Hypertension Diastolic blood pressure elevated at 82 mmHg. - Encourage regular exercise and monitor blood pressure.  Hyperlipidemia LDL cholesterol at 82  mg/dL, target <29 mg/dL due to aortic plaque. On rosuvastatin  10 mg, requires lifestyle modifications. - Continue rosuvastatin  10 mg daily. - Encourage lifestyle modifications including exercise and dietary changes. - Continue using ground flax seeds as a fiber supplement.  Gastroesophageal Reflux Disease (GERD) GERD treated with Protonix . Morning cough likely due to sinus drainage, not on Xyzal . - Continue Protonix  daily. - Start Xyzal  for sinus drainage.  Alcohol Use Consumes 1.5 to 2 alcoholic drinks daily, potentially affecting cognition and increasing cancer risk. - Discuss potential benefits of reducing alcohol consumption. - Consider referral to psychiatrist for support in reducing alcohol use.  Cognitive Concerns Minor lapses in judgment and memory reported. Alcohol use discussed as a factor. - Perform blood tests to check B12 levels and other potential causes of cognitive decline. - Discuss potential referral to a neurologist if blood tests are normal.  Psychiatric Referral Unable to secure psychiatrist appointment, agreed to referral to Dr. Darra. - Refer to Dr. Darra, a psychiatrist located in [place].     Return if symptoms worsen or fail to improve.  Patient was given opportunity to ask questions. Patient verbalized understanding of the plan and was able to repeat key elements of the plan. All questions were answered to their satisfaction.   I, Catheryn LOISE Slocumb, MD, have reviewed all documentation for this visit. The documentation on 08/12/23 for the exam, diagnosis, procedures, and orders are all accurate and complete.   IF YOU HAVE BEEN REFERRED TO A SPECIALIST, IT MAY TAKE 1-2 WEEKS TO SCHEDULE/PROCESS THE REFERRAL. IF YOU HAVE NOT HEARD FROM US /SPECIALIST IN TWO WEEKS, PLEASE GIVE US  A CALL AT [phone].   THE PATIENT IS ENCOURAGED TO PRACTICE SOCIAL DISTANCING DUE TO THE COVID-19 PANDEMIC.

## 2023-08-13 LAB — CMP14+EGFR
ALT: 14 IU/L (ref 0–44)
AST: 16 IU/L (ref 0–40)
Albumin: 4.4 g/dL (ref 3.8–4.8)
Alkaline Phosphatase: 86 IU/L (ref 44–121)
BUN/Creatinine Ratio: 17 (ref 10–24)
BUN: 18 mg/dL (ref 8–27)
Bilirubin Total: 1.4 mg/dL — ABNORMAL HIGH (ref 0.0–1.2)
CO2: 21 mmol/L (ref 20–29)
Calcium: 10 mg/dL (ref 8.6–10.2)
Chloride: 98 mmol/L (ref 96–106)
Creatinine, Ser: 1.05 mg/dL (ref 0.76–1.27)
Globulin, Total: 3 g/dL (ref 1.5–4.5)
Glucose: 121 mg/dL — ABNORMAL HIGH (ref 70–99)
Potassium: 5.3 mmol/L — ABNORMAL HIGH (ref 3.5–5.2)
Sodium: 139 mmol/L (ref 134–144)
Total Protein: 7.4 g/dL (ref 6.0–8.5)
eGFR: 73 mL/min/1.73 (ref >59)

## 2023-08-13 LAB — VITAMIN B12: Vitamin B-12: 521 pg/mL (ref 232–1245)

## 2023-08-13 LAB — LIPID PANEL
Chol/HDL Ratio: 3.5 ratio (ref 0.0–5.0)
Cholesterol, Total: 141 mg/dL (ref 100–199)
HDL: 40 mg/dL (ref >39)
LDL Chol Calc (NIH): 72 mg/dL (ref 0–99)
Triglycerides: 168 mg/dL — ABNORMAL HIGH (ref 0–149)
VLDL Cholesterol Cal: 29 mg/dL (ref 5–40)

## 2023-08-13 LAB — HEMOGLOBIN A1C
Est. average glucose Bld gHb Est-mCnc: 134 mg/dL
Hgb A1c MFr Bld: 6.3 % — ABNORMAL HIGH (ref 4.8–5.6)

## 2023-08-13 LAB — TSH: TSH: 2.79 u[IU]/mL (ref 0.450–4.500)

## 2023-08-14 ENCOUNTER — Ambulatory Visit: Payer: Self-pay | Admitting: Internal Medicine

## 2023-08-15 NOTE — Telephone Encounter (Signed)
 Pt recently had labs done by his PCP on 7/21 and all results are available to review via epic.   Pt wanted to update Dr. Raford about this, for her review and reference.  Will route this message to Dr. Raford and her nurse to make them aware of visible labs from PCP office.

## 2023-08-16 DIAGNOSIS — F1090 Alcohol use, unspecified, uncomplicated: Secondary | ICD-10-CM | POA: Insufficient documentation

## 2023-08-16 NOTE — Assessment & Plan Note (Signed)
 Consumes 1.5 to 2 alcoholic drinks daily, potentially affecting cognition and increasing cancer risk. - Discuss potential benefits of reducing alcohol consumption. - Consider referral to psychiatrist for support in reducing alcohol use.

## 2023-09-04 ENCOUNTER — Encounter: Payer: Self-pay | Admitting: Physician Assistant

## 2023-09-21 DIAGNOSIS — F411 Generalized anxiety disorder: Secondary | ICD-10-CM | POA: Diagnosis not present

## 2023-09-21 DIAGNOSIS — F4322 Adjustment disorder with anxiety: Secondary | ICD-10-CM | POA: Diagnosis not present

## 2023-11-15 NOTE — Progress Notes (Signed)
 Assessment/Plan:     Carlos [name] is a very pleasant 78 y.o. year old RH male with a history of hyperlipidemia, atrial flutter on Eliquis , insomnia, anxiety, seen today for evaluation of memory loss. MoCA today is 25/30.  Etiology unclear, workup is in progress. Patient is able to participate on ADLs and to drive without difficulties    Memory impairment of unclear etiology   MRI brain without contrast to assess for underlying structural abnormality and assess vascular load  Monitor the use of Ambien  as this can affect memory Recommend good control of cardiovascular risk factors.   Continue to control mood as per psychiatry for adjustment disorder, anxiety Alcohol reduction counseled Fol[place]ow up after neuropsych evaluation  Subjective:    The patient is here alone    How long did patient have memory difficulties?  I don't know why I am here-he says, my memory is fine although admits minor moments of memory trouble and poor judgement  for the last 5-6 years  including new information, recent conversations and names of people. LTM is normal. repeats oneself?  Denies  Disoriented when walking into a room?  Denies except occasionally not remembering what patient came to the room for.    Leaving objects in unusual places?   Denies.  Wandering behavior? Denies.   Any personality changes, or depression, anxiety?  Has moments of irritability, anxiety at times, denies depression Hallucinations or paranoia?  Denies.   Seizures? Denies.    Any sleep changes?  Sleeps well with Ambien . Denies vivid dreams, REM behavior or sleepwalking.   Sleep apnea? Denies.   Any hygiene concerns?  Denies.   Independent of bathing and dressing?  Denies  Who is in charge of the medications? Patient is in charge   Who is in charge of the finances?   Patient is in charge     Any changes in appetite?   Denies.     Patient have trouble swallowing?  Denies.   Does the patient cook?  Yes, denies  forgetting his recipes, or kitchen accidents Any headaches?  Denies.   Any vision changes?  Denies.    Chronic back pain?  Denies.   Ambulates with difficulty? [facility] on [facility] and weightlifting.     Recent falls or head injuries? Denies.     Stroke like symptoms?  Denies.   Any tremors?  Denies.   Any anosmia?  Denies.   Any incontinence of urine? Denies.   Any bowel dysfunction? Denies.      Patient lives with his son   History of heavy alcohol intake? He drinks 1 or 2 alcoholic drinks daily in the evening with dinner History of heavy tobacco use? No Family history of dementia? Mother had dementia ? type but no AD Does patient drive? Yes, denies getting lost  PhD Engineering, worked for [employer],   Pertinent labs July 2025: TSH 2.790, B12 521, A1c 6.3 TG 168 the rest of the lipid panel are normal  Allergies  Allergen Reactions   Grass Pollen([id number] Vern) Cough and Other (See Comments)    Sneezing  Runny nose  Itchy eyes    Current Outpatient Medications  Medication Instructions   Blood Glucose Monitoring Suppl (TRUE METRIX GO GLUCOSE METER) w/Device KIT USE TO CHECK BLOOD SUGARS ONCE DAILY.   busPIRone (BUSPAR) 15 mg, 2 times daily   CALCIUM  PO 1 tablet, Daily   Cholecalciferol (VITAMIN D PO) 1 capsule, Daily   Eliquis   5 mg, Oral, 2 times daily   latanoprost (XALATAN) 0.005 % ophthalmic solution SMARTSIG:In Eye(s)   levocetirizine (XYZAL ) 5 mg, Oral, Every evening   MAGNESIUM PO Take by mouth.   metoprolol  tartrate (LOPRESSOR ) 50 mg, Oral, 2 times daily   Multiple Vitamin (MULTIVITAMIN) capsule 1 capsule, Daily   pantoprazole  (PROTONIX ) 40 mg, Oral, 2 times daily   rosuvastatin  (CRESTOR ) 10 mg, Oral, Daily   zolpidem  (AMBIEN ) 10 mg, Oral, At bedtime PRN     VITALS:   Vitals:   11/19/23 0748  BP: 113/72  Pulse: 60  Resp: 20  Weight: 162 lb (73.5 kg)  Height: 5' 10 (1.778 m)          11/19/2023    8:00 AM  [facility]  Cognitive Assessment   Visuospatial/ Executive (0/5) 3  Naming (0/3) 3  Attention: Read list of digits (0/2) 2  Attention: Read list of letters (0/1) 1  Attention: Serial 7 subtraction starting at 100 (0/3) 3  Language: Repeat phrase (0/2) 1  Language : Fluency (0/1) 1  Abstraction (0/2) 2  Delayed Recall (0/5) 3  Orientation (0/6) 6  Total 25  Adjusted Score (based on education) 25        No data to display           PHYSICAL EXAM   HEENT:  Normocephalic, atraumatic. The mucous membranes are moist. The superficial temporal arteries are without ropiness or tenderness. Cardiovascular: Regular rate and rhythm. Lungs: Clear to auscultation bilaterally. Neck: There are no carotid bruits noted bilaterally. Orientation:  Alert and oriented to person, place and time. No aphasia or dysarthria. Fund of knowledge is reduced. Recent and remote memory impaired.  Attention and concentration are reduced.  Able to name objects and repeat phrases 1/2. Delayed recall 3/5 Cranial nerves: There is good facial symmetry. Extraocular muscles are intact and visual fields are full to confrontational testing. Speech is fluent and clear, no tongue deviation. Hearing is intact to conversational tone.  Tone: Tone is good throughout. Sensation: Sensation is intact to light touch and pinprick throughout. Vibration is intact at the bilateral big toe. Coordination: The patient has no difficulty with RAM's or FNF bilaterally. Normal finger to nose  Motor: Strength is 5/5 in the bilateral upper and lower extremities. There is no pronator drift. There are no fasciculations noted. DTR's: Deep tendon reflexes are 2/4 .  Plantar responses are downgoing bilaterally. Gait and Station: The patient is able to ambulate with difficulty. Needs a walker to ambulate. Gait is cautious and narrow.      Thank you for allowing us  the opportunity to participate in the care of this nice patient. Please do not hesitate to contact  us  for any questions or concerns.   Total time spent on today's visit was 63 minutes dedicated to this patient today, preparing to see patient, examining the patient, ordering tests and/or medications and counseling the patient, documenting clinical information in the EHR or other health record, independently interpreting results and communicating results to the patient/family, discussing treatment and goals, answering patient's questions and coordinating care.  Cc:  Jarold Medici, MD  Camie Sevin 11/19/2023 8:22 AM

## 2023-11-19 ENCOUNTER — Ambulatory Visit

## 2023-11-19 ENCOUNTER — Ambulatory Visit (INDEPENDENT_AMBULATORY_CARE_PROVIDER_SITE_OTHER): Payer: Self-pay | Admitting: Physician Assistant

## 2023-11-19 ENCOUNTER — Encounter: Payer: Self-pay | Admitting: Physician Assistant

## 2023-11-19 VITALS — BP 113/72 | HR 60 | Resp 20 | Ht 70.0 in | Wt 162.0 lb

## 2023-11-19 DIAGNOSIS — R413 Other amnesia: Secondary | ICD-10-CM | POA: Diagnosis not present

## 2023-11-19 NOTE — Patient Instructions (Addendum)
 It was a pleasure to see you today at our office.   Recommendations:  Neurocognitive evaluation at our office   MRI of the brain, the radiology office will call you to arrange you appointment  [phone] Follow up pending on the above results     [email or web address]   RECOMMENDATIONS FOR ALL PATIENTS WITH MEMORY PROBLEMS: 1. Continue to exercise (Recommend 30 minutes of walking everyday, or 3 hours every week) 2. Increase social interactions - continue going to [place] and enjoy social gatherings with friends and family 3. Eat healthy, avoid fried foods and eat more fruits and vegetables 4. Maintain adequate blood pressure, blood sugar, and blood cholesterol level. Reducing the risk of stroke and cardiovascular disease also helps promoting better memory. 5. Avoid stressful situations. Live a simple life and avoid aggravations. Organize your time and prepare for the next day in anticipation. 6. Sleep well, avoid any interruptions of sleep and avoid any distractions in the bedroom that may interfere with adequate sleep quality 7. Avoid sugar, avoid sweets as there is a strong link between excessive sugar intake, diabetes, and cognitive impairment We discussed the Mediterranean diet, which has been shown to help patients reduce the risk of progressive memory disorders and reduces cardiovascular risk. This includes eating fish, eat fruits and green leafy vegetables, nuts like almonds and hazelnuts, walnuts, and also use olive oil. Avoid fast foods and fried foods as much as possible. Avoid sweets and sugar as sugar use has been linked to worsening of memory function.  There is always a concern of gradual progression of memory problems. If this is the case, then we may need to adjust level of care according to patient needs. Support, both to the patient and caregiver, should then be put into place.      You have been referred for a  neuropsychological evaluation (i.e., evaluation of memory and thinking abilities). Please bring someone with you to this appointment if possible, as it is helpful for the doctor to hear from both you and another adult who knows you well. Please bring eyeglasses and hearing aids if you wear them.    The evaluation will take approximately 3 hours and has two parts:   The first part is a clinical interview with the neuropsychologist (Dr. Richie or Dr. Gayland). During the interview, the neuropsychologist will speak with you and the individual you brought to the appointment.    The second part of the evaluation is testing with the doctor's technician Neal or Luke). During the testing, the technician will ask you to remember different types of material, solve problems, and answer some questionnaires. Your family member will not be present for this portion of the evaluation.   Please note: We must reserve several hours of the neuropsychologist's time and the psychometrician's time for your evaluation appointment. As such, there is a No-Show fee of $100. If you are unable to attend any of your appointments, please contact our office as soon as possible to reschedule.      DRIVING: Regarding driving, in patients with progressive memory problems, driving will be impaired. We advise to have someone else do the driving if trouble finding directions or if minor accidents are reported. Independent driving assessment is available to determine safety of driving.   If you are interested in the driving assessment, you can contact the following:  [facility] [employer] [facility] in [place] [phone]  [facility] [phone]  [facility] [phone]  [facility] [phone] or [phone]   FALL  PRECAUTIONS: Be cautious when walking. Scan the area for obstacles that may increase the risk of trips and falls. When getting up in the mornings, sit up at the edge of the bed for a  few minutes before getting out of bed. Consider elevating the bed at the head end to avoid drop of blood pressure when getting up. Walk always in a well-lit room (use night lights in the walls). Avoid area rugs or power cords from appliances in the middle of the walkways. Use a walker or a cane if necessary and consider physical therapy for balance exercise. Get your eyesight checked regularly.  FINANCIAL OVERSIGHT: Supervision, especially oversight when making financial decisions or transactions is also recommended.  HOME SAFETY: Consider the safety of the kitchen when operating appliances like stoves, microwave oven, and blender. Consider having supervision and share cooking responsibilities until no longer able to participate in those. Accidents with firearms and other hazards in the house should be identified and addressed as well.   ABILITY TO BE LEFT ALONE: If patient is unable to contact 911 operator, consider using LifeLine, or when the need is there, arrange for someone to stay with patients. Smoking is a fire hazard, consider supervision or cessation. Risk of wandering should be assessed by caregiver and if detected at any point, supervision and safe proof recommendations should be instituted.  MEDICATION SUPERVISION: Inability to self-administer medication needs to be constantly addressed. Implement a mechanism to ensure safe administration of the medications.      Mediterranean Diet A Mediterranean diet refers to food and lifestyle choices that are based on the traditions of countries located on the [employer]. This way of eating has been shown to help prevent certain conditions and improve outcomes for people who have chronic diseases, like kidney disease and heart disease. What are tips for following this plan? Lifestyle  Cook and eat meals together with your family, when possible. Drink enough fluid to keep your urine clear or pale yellow. Be physically active every day.  This includes: Aerobic exercise like running or swimming. Leisure activities like gardening, walking, or housework. Get 7-8 hours of sleep each night. If recommended by your health care provider, drink red wine in moderation. This means 1 glass a day for nonpregnant women and 2 glasses a day for men. A glass of wine equals 5 oz (150 mL). Reading food labels  Check the serving size of packaged foods. For foods such as rice and pasta, the serving size refers to the amount of cooked product, not dry. Check the total fat in packaged foods. Avoid foods that have saturated fat or trans fats. Check the ingredients list for added sugars, such as corn syrup. Shopping  At the grocery store, buy most of your food from the areas near the walls of the store. This includes: Fresh fruits and vegetables (produce). Grains, beans, nuts, and seeds. Some of these may be available in unpackaged forms or large amounts (in bulk). Fresh seafood. Poultry and eggs. Low-fat dairy products. Buy whole ingredients instead of prepackaged foods. Buy fresh fruits and vegetables in-season from local farmers markets. Buy frozen fruits and vegetables in resealable bags. If you do not have access to quality fresh seafood, buy precooked frozen shrimp or canned fish, such as tuna, salmon, or sardines. Buy small amounts of raw or cooked vegetables, salads, or olives from the deli or salad bar at your store. Stock your pantry so you always have certain foods on hand, such as olive oil, canned  tuna, canned tomatoes, rice, pasta, and beans. Cooking  Cook foods with extra-virgin olive oil instead of using butter or other vegetable oils. Have meat as a side dish, and have vegetables or grains as your main dish. This means having meat in small portions or adding small amounts of meat to foods like pasta or stew. Use beans or vegetables instead of meat in common dishes like chili or lasagna. Experiment with different cooking methods.  Try roasting or broiling vegetables instead of steaming or sauteing them. Add frozen vegetables to soups, stews, pasta, or rice. Add nuts or seeds for added healthy fat at each meal. You can add these to yogurt, salads, or vegetable dishes. Marinate fish or vegetables using olive oil, lemon juice, garlic, and fresh herbs. Meal planning  Plan to eat 1 vegetarian meal one day each week. Try to work up to 2 vegetarian meals, if possible. Eat seafood 2 or more times a week. Have healthy snacks readily available, such as: Vegetable sticks with hummus. Greek yogurt. Fruit and nut trail mix. Eat balanced meals throughout the week. This includes: Fruit: 2-3 servings a day Vegetables: 4-5 servings a day Low-fat dairy: 2 servings a day Fish, poultry, or lean meat: 1 serving a day Beans and legumes: 2 or more servings a week Nuts and seeds: 1-2 servings a day Whole grains: 6-8 servings a day Extra-virgin olive oil: 3-4 servings a day Limit red meat and sweets to only a few servings a month What are my food choices? Mediterranean diet Recommended Grains: Whole-grain pasta. Brown rice. Bulgar wheat. Polenta. Couscous. Whole-wheat bread. [place]. Vegetables: Artichokes. Beets. Broccoli. Cabbage. Carrots. Eggplant. Green beans. Chard. Kale. Spinach. Onions. Leeks. Peas. Squash. Tomatoes. Peppers. Radishes. Fruits: Apples. Apricots. Avocado. Berries. Bananas. Cherries. Dates. Figs. Grapes. Lemons. Melon. Oranges. Peaches. Plums. Pomegranate. Meats and other protein foods: Beans. Almonds. Sunflower seeds. Pine nuts. Peanuts. Cod. Salmon. Scallops. Shrimp. Tuna. Tilapia. Clams. Oysters. Eggs. Dairy: Low-fat milk. Cheese. Greek yogurt. Beverages: Water. Red wine. Herbal tea. Fats and oils: Extra virgin olive oil. Avocado oil. Grape seed oil. Sweets and desserts: Greek yogurt with honey. Baked apples. Poached pears. Trail mix. Seasoning and other foods: Basil. Cilantro. Coriander. Cumin. Mint.  Parsley. Sage. Rosemary. Tarragon. Garlic. Oregano. Thyme. Pepper. Balsalmic vinegar. Tahini. Hummus. Tomato sauce. Olives. Mushrooms. Limit these Grains: Prepackaged pasta or rice dishes. Prepackaged cereal with added sugar. Vegetables: Deep fried potatoes (french fries). Fruits: Fruit canned in syrup. Meats and other protein foods: Beef. Pork. Lamb. Poultry with skin. Hot dogs. [place]. Dairy: Ice cream. Sour cream. Whole milk. Beverages: Juice. Sugar-sweetened soft drinks. Beer. Liquor and spirits. Fats and oils: Butter. Canola oil. Vegetable oil. Beef fat (tallow). Lard. Sweets and desserts: Cookies. Cakes. Pies. Candy. Seasoning and other foods: Mayonnaise. Premade sauces and marinades. The items listed may not be a complete list. Talk with your dietitian about what dietary choices are right for you. Summary The Mediterranean diet includes both food and lifestyle choices. Eat a variety of fresh fruits and vegetables, beans, nuts, seeds, and whole grains. Limit the amount of red meat and sweets that you eat. Talk with your health care provider about whether it is safe for you to drink red wine in moderation. This means 1 glass a day for nonpregnant women and 2 glasses a day for men. A glass of wine equals 5 oz (150 mL). This information is not intended to replace advice given to you by your health care provider. Make sure you discuss any questions you have  with your health care provider. Document Released: 09/01/2015 Document Revised: 10/04/2015 Document Reviewed: 09/01/2015 Elsevier Interactive Patient Education  2017 [employer].

## 2023-11-26 ENCOUNTER — Encounter: Payer: Self-pay | Admitting: Physician Assistant

## 2023-12-08 ENCOUNTER — Ambulatory Visit
Admission: RE | Admit: 2023-12-08 | Discharge: 2023-12-08 | Disposition: A | Source: Ambulatory Visit | Attending: Physician Assistant | Admitting: Physician Assistant

## 2023-12-08 DIAGNOSIS — G319 Degenerative disease of nervous system, unspecified: Secondary | ICD-10-CM | POA: Diagnosis not present

## 2023-12-10 ENCOUNTER — Ambulatory Visit: Payer: Self-pay | Admitting: Physician Assistant

## 2024-01-25 ENCOUNTER — Other Ambulatory Visit: Payer: Self-pay | Admitting: Cardiovascular Disease

## 2024-01-25 DIAGNOSIS — I480 Paroxysmal atrial fibrillation: Secondary | ICD-10-CM

## 2024-01-27 NOTE — Telephone Encounter (Signed)
 Eliquis  5mg  refill request received. Patient is 79 years old, weight-73.5kg, Crea-1.05 on 08/12/23, Diagnosis-Afib, and last seen by Dr. Raford on 07/04/23. Dose is appropriate based on dosing criteria. Will send in refill to requested pharmacy.

## 2024-02-10 ENCOUNTER — Encounter: Payer: Self-pay | Admitting: Internal Medicine

## 2024-02-10 ENCOUNTER — Telehealth: Payer: Self-pay | Admitting: *Deleted

## 2024-02-10 ENCOUNTER — Ambulatory Visit: Admitting: Internal Medicine

## 2024-02-10 VITALS — BP 120/74 | HR 70 | Temp 97.7°F | Ht 70.0 in | Wt 164.0 lb

## 2024-02-10 DIAGNOSIS — E7800 Pure hypercholesterolemia, unspecified: Secondary | ICD-10-CM

## 2024-02-10 DIAGNOSIS — K219 Gastro-esophageal reflux disease without esophagitis: Secondary | ICD-10-CM

## 2024-02-10 DIAGNOSIS — I483 Typical atrial flutter: Secondary | ICD-10-CM | POA: Diagnosis not present

## 2024-02-10 DIAGNOSIS — Z2821 Immunization not carried out because of patient refusal: Secondary | ICD-10-CM | POA: Diagnosis not present

## 2024-02-10 DIAGNOSIS — Z0000 Encounter for general adult medical examination without abnormal findings: Secondary | ICD-10-CM

## 2024-02-10 DIAGNOSIS — R7303 Prediabetes: Secondary | ICD-10-CM

## 2024-02-10 DIAGNOSIS — F419 Anxiety disorder, unspecified: Secondary | ICD-10-CM

## 2024-02-10 DIAGNOSIS — I700 Atherosclerosis of aorta: Secondary | ICD-10-CM | POA: Diagnosis not present

## 2024-02-10 MED ORDER — FAMOTIDINE 20 MG PO TABS: 20.0000 mg | ORAL_TABLET | Freq: Every day | ORAL | 1 refills | Status: AC

## 2024-02-10 MED ORDER — LEVOCETIRIZINE DIHYDROCHLORIDE 5 MG PO TABS: 5.0000 mg | ORAL_TABLET | Freq: Every evening | ORAL | 2 refills | Status: AC

## 2024-02-10 NOTE — Patient Instructions (Addendum)
 Health Maintenance, Male  Adopting a healthy lifestyle and getting preventive care are important in promoting health and wellness. Ask your health care provider about:  The right schedule for you to have regular tests and exams.  Things you can do on your own to prevent diseases and keep yourself healthy.  What should I know about diet, weight, and exercise?  Eat a healthy diet    Eat a diet that includes plenty of vegetables, fruits, low-fat dairy products, and lean protein.  Do not eat a lot of foods that are high in solid fats, added sugars, or sodium.  Maintain a healthy weight  Body mass index (BMI) is a measurement that can be used to identify possible weight problems. It estimates body fat based on height and weight. Your health care provider can help determine your BMI and help you achieve or maintain a healthy weight.  Get regular exercise  Get regular exercise. This is one of the most important things you can do for your health. Most adults should:  Exercise for at least 150 minutes each week. The exercise should increase your heart rate and make you sweat (moderate-intensity exercise).  Do strengthening exercises at least twice a week. This is in addition to the moderate-intensity exercise.  Spend less time sitting. Even light physical activity can be beneficial.  Watch cholesterol and blood lipids  Have your blood tested for lipids and cholesterol at 79 years of age, then have this test every 5 years.  You may need to have your cholesterol levels checked more often if:  Your lipid or cholesterol levels are high.  You are older than 79 years of age.  You are at high risk for heart disease.  What should I know about cancer screening?  Many types of cancers can be detected early and may often be prevented. Depending on your health history and family history, you may need to have cancer screening at various ages. This may include screening for:  Colorectal cancer.  Prostate cancer.  Skin cancer.  Lung  cancer.  What should I know about heart disease, diabetes, and high blood pressure?  Blood pressure and heart disease  High blood pressure causes heart disease and increases the risk of stroke. This is more likely to develop in people who have high blood pressure readings or are overweight.  Talk with your health care provider about your target blood pressure readings.  Have your blood pressure checked:  Every 3-5 years if you are 79-79 years of age.  Every year if you are 79 years old or older.  If you are between the ages of 60 and 72 and are a current or former smoker, ask your health care provider if you should have a one-time screening for abdominal aortic aneurysm (AAA).  Diabetes  Have regular diabetes screenings. This checks your fasting blood sugar level. Have the screening done:  Once every three years after age 79 if you are at a normal weight and have a low risk for diabetes.  More often and at a younger age if you are overweight or have a high risk for diabetes.  What should I know about preventing infection?  Hepatitis B  If you have a higher risk for hepatitis B, you should be screened for this virus. Talk with your health care provider to find out if you are at risk for hepatitis B infection.  Hepatitis C  Blood testing is recommended for:  Everyone born from [birth year] through 1965.  Anyone  with known risk factors for hepatitis C.  Sexually transmitted infections (STIs)  You should be screened each year for STIs, including gonorrhea and chlamydia, if:  You are sexually active and are younger than 79 years of age.  You are older than 79 years of age and your health care provider tells you that you are at risk for this type of infection.  Your sexual activity has changed since you were last screened, and you are at increased risk for chlamydia or gonorrhea. Ask your health care provider if you are at risk.  Ask your health care provider about whether you are at high risk for HIV. Your health care provider  may recommend a prescription medicine to help prevent HIV infection. If you choose to take medicine to prevent HIV, you should first get tested for HIV. You should then be tested every 3 months for as long as you are taking the medicine.  Follow these instructions at home:  Alcohol use  Do not drink alcohol if your health care provider tells you not to drink.  If you drink alcohol:  Limit how much you have to 0-2 drinks a day.  Know how much alcohol is in your drink. In the [place] one drink equals one 12 oz bottle of beer (355 mL), one 5 oz glass of wine (148 mL), or one 1 oz glass of hard liquor (44 mL).  Lifestyle  Do not use any products that contain nicotine or tobacco. These products include cigarettes, chewing tobacco, and vaping devices, such as e-cigarettes. If you need help quitting, ask your health care provider.  Do not use street drugs.  Do not share needles.  Ask your health care provider for help if you need support or information about quitting drugs.  General instructions  Schedule regular health, dental, and eye exams.  Stay current with your vaccines.  Tell your health care provider if:  You often feel depressed.  You have ever been abused or do not feel safe at home.  Summary  Adopting a healthy lifestyle and getting preventive care are important in promoting health and wellness.  Follow your health care provider's instructions about healthy diet, exercising, and getting tested or screened for diseases.  Follow your health care provider's instructions on monitoring your cholesterol and blood pressure.  This information is not intended to replace advice given to you by your health care provider. Make sure you discuss any questions you have with your health care provider.  Document Revised: 05/30/2020 Document Reviewed: 05/30/2020  Elsevier Patient Education  2024 [employer].

## 2024-02-10 NOTE — Assessment & Plan Note (Signed)
 Chronic, LDL goal is less than 70.  He will continue with rosuvastatin 10mg  daily. He will f/u in six months.

## 2024-02-10 NOTE — Assessment & Plan Note (Signed)
 He declined EKG.  Managed with metoprolol  and Eliquis .  No upcoming cardiology appointment scheduled. - Continue metoprolol . - Continue Eliquis . - Documented decline of EKG.

## 2024-02-10 NOTE — Progress Notes (Unsigned)
 Complex Care Management Note Care Guide Note  02/10/2024 Name: Devontae Casasola MRN: [id number] DOB: 1945-03-06   Complex Care Management Outreach Attempts: An unsuccessful telephone outreach was attempted today to offer the patient information about available complex care management services.  Follow Up Plan:  Additional outreach attempts will be made to offer the patient complex care management information and services.   Encounter Outcome:  No Answer  Thedford Franks, CMA, AAMA [place]  [facility], [facility] Guide, Lead Direct Dial: [phone]  Fax: [phone]

## 2024-02-10 NOTE — Assessment & Plan Note (Signed)
A full exam was performed.  DRE deferred.  He is advised to get 30-45 minutes of regular exercise, no less than four to five days per week. Both weight-bearing and aerobic exercises are recommended.  He is advised to follow a healthy diet with at least six fruits/veggies per day, decrease intake of red meat and other saturated fats and to increase fish intake to twice weekly.  Meats/fish should not be fried -- baked, boiled or broiled is preferable. It is also important to cut back on your sugar intake.  Be sure to read labels - try to avoid anything with added sugar, high fructose corn syrup or other sweeteners.  If you must use a sweetener, you can try stevia or monkfruit.  It is also important to avoid artificially sweetened foods/beverages and diet drinks. Lastly, wear SPF 50 sunscreen on exposed skin and when in direct sunlight for an extended period of time.  Be sure to avoid fast food restaurants and aim for at least 60 ounces of water daily.

## 2024-02-10 NOTE — Progress Notes (Signed)
 I,[name], CMA,acting as a neurosurgeon for Carlos LOISE Slocumb, MD.,have documented all relevant documentation on the behalf of Carlos LOISE Slocumb, MD,as directed by  Carlos LOISE Slocumb, MD while in the presence of Carlos LOISE Slocumb, MD.  Subjective:   Patient ID: Carlos [name] , male    DOB: 1945/06/08 , 79 y.o.   MRN: [id number]  Chief Complaint  Patient presents with   Annual Exam    Patient presents today for HM, Patient reports compliance with medication. Patient denies any chest pain, SOB, or headaches. Patient has no concerns today.     HPI  Discussed the use of AI scribe software for clinical note transcription with the patient, who gave verbal consent to proceed.  History of Present Illness Carlos [name] is a 79 year old male who presents for an annual physical exam.  He has a history of elevated A1c, with a reading of 7.0 in 2024. He manages his diet by avoiding sugar, processed meats, and sugary sodas.  He experiences excessive flatulence, which his son has also noticed. He consumes milk with cereal and occasionally with cake, but not much dairy otherwise. He eats a lot of vegetables and is considering dietary changes to address the gas.  He has a history of atrial flutter and is on metoprolol  for heart rate control. He is also taking Eliquis . He last saw his cardiologist in June of the previous year and is due for a follow-up in February.  He experiences acid reflux, particularly at night, and is currently taking pantoprazole . He describes the sensation as coming from the chest rather than the stomach.  He has stopped taking Buspar as he did not feel it was effective. He feels anxious but not depressed. He is not currently seeing a psychiatrist and has not been prescribed medications like sertraline  or Prozac.  He is on Xalatan eye drops for glaucoma, rosuvastatin  10 mg for cholesterol, and Ambien  for sleep. He takes Xyzal  as needed for allergies.  He exercises regularly,  including 20 minutes on the treadmill and using various machines at the gym twice a week. He reports good bowel movements, typically once after breakfast, and does not get up at night to use the restroom.     Past Medical History:  Diagnosis Date   Allergic rhinitis    Atypical chest pain 09/16/2015   BPPV (benign paroxysmal positional vertigo) 06/08/2022   Dysrhythmia    a-fib/flutter   Elevated blood pressure 10/19/2015   GERD (gastroesophageal reflux disease)    Hypertension    Insomnia    Left inguinal hernia    Mixed hyperlipidemia    Pure hypercholesterolemia 10/02/2018   PVC (premature ventricular contraction) 10/19/2015   Typical atrial flutter (HCC) 10/02/2018   Wheezing 06/08/2022     Family History  Problem Relation Age of Onset   Healthy Mother    Heart attack Father    Pulmonary embolism Brother     Current Medications[1]   Allergies[2]   Men's preventive visit. Patient Health Questionnaire (PHQ-2) is  Flowsheet Row Office Visit from 08/12/2023 in [facility]  PHQ-2 Total Score 0  . Patient is on a healthy diet. Marital status: Divorced. Relevant history for alcohol use is:  Social History   Substance and Sexual Activity  Alcohol Use Not Currently   Comment: not in last 3 months  . Relevant history for tobacco use is: Tobacco Use History[3].   Review of Systems  Constitutional: Negative.   HENT: Negative.  Eyes: Negative.   Respiratory: Negative.    Cardiovascular: Negative.   Gastrointestinal: Negative.   Endocrine: Negative.   Genitourinary: Negative.   Musculoskeletal: Negative.   Skin: Negative.   Allergic/Immunologic: Negative.   Neurological: Negative.   Hematological: Negative.   Psychiatric/Behavioral: Negative.       Today's Vitals   02/10/24 0841  BP: 120/74  Pulse: 70  Temp: 97.7 F (36.5 C)  TempSrc: Oral  Weight: 164 lb (74.4 kg)  Height: 5' 10 (1.778 m)  PainSc: 0-No pain   Body  mass index is 23.53 kg/m.  Wt Readings from Last 3 Encounters:  02/10/24 164 lb (74.4 kg)  11/19/23 162 lb (73.5 kg)  08/12/23 163 lb 3.2 oz (74 kg)    Objective:  Physical Exam Vitals and nursing note reviewed.  Constitutional:      Appearance: Normal appearance.  HENT:     Head: Normocephalic and atraumatic.     Right Ear: Tympanic membrane, ear canal and external ear normal.     Left Ear: Tympanic membrane, ear canal and external ear normal.     Nose: Nose normal.     Mouth/Throat:     Mouth: Mucous membranes are moist.     Pharynx: Oropharynx is clear.  Eyes:     Extraocular Movements: Extraocular movements intact.     Conjunctiva/sclera: Conjunctivae normal.     Pupils: Pupils are equal, round, and reactive to light.  Cardiovascular:     Rate and Rhythm: Normal rate. Rhythm irregular.     Pulses: Normal pulses.     Heart sounds: Normal heart sounds.  Pulmonary:     Effort: Pulmonary effort is normal.     Breath sounds: Normal breath sounds.  Chest:  Breasts:    Right: Normal. No swelling, bleeding, inverted nipple, mass or nipple discharge.     Left: Normal. No swelling, bleeding, inverted nipple, mass or nipple discharge.  Abdominal:     General: Abdomen is flat. Bowel sounds are normal.     Palpations: Abdomen is soft.  Genitourinary:    Comments: Deferred  Musculoskeletal:        General: Normal range of motion.     Cervical back: Normal range of motion and neck supple.  Skin:    General: Skin is warm.  Neurological:     General: No focal deficit present.     Mental Status: He is alert.  Psychiatric:        Mood and Affect: Mood normal.        Behavior: Behavior normal.         Assessment And Plan:    Encounter for general adult medical examination w/o abnormal findings Assessment & Plan: A full exam was performed.  DRE deferred.  He is advised to get 30-45 minutes of regular exercise, no less than four to five days per week. Both weight-bearing and  aerobic exercises are recommended.  He is advised to follow a healthy diet with at least six fruits/veggies per day, decrease intake of red meat and other saturated fats and to increase fish intake to twice weekly.  Meats/fish should not be fried -- baked, boiled or broiled is preferable. It is also important to cut back on your sugar intake.  Be sure to read labels - try to avoid anything with added sugar, high fructose corn syrup or other sweeteners.  If you must use a sweetener, you can try stevia or monkfruit.  It is also important to avoid artificially sweetened foods/beverages  and diet drinks. Lastly, wear SPF 50 sunscreen on exposed skin and when in direct sunlight for an extended period of time.  Be sure to avoid fast food restaurants and aim for at least 60 ounces of water daily.       Typical atrial flutter (HCC) Assessment & Plan: He declined EKG.  Managed with metoprolol  and Eliquis .  No upcoming cardiology appointment scheduled. - Continue metoprolol . - Continue Eliquis . - Documented decline of EKG.  Orders: -     TSH  Pure hypercholesterolemia Assessment & Plan: Chronic, LDL goal is less than 70.  He will continue with rosuvastatin  daily.  - Encouraged to follow heart healthy lifestyle.   Orders: -     CBC -     CMP14+EGFR -     Lipid panel  Aortic atherosclerosis Assessment & Plan: Chronic, LDL goal is less than 70.  He will continue with rosuvastatin  10mg  daily. He will f/u in six months.    Prediabetes Assessment & Plan: Previous A1c of 7.0 in 2024. No current diabetes diagnosis as only one reading above 6.5. Lifestyle modifications discussed to prevent progression to diabetes. - Continue lifestyle modifications to manage blood sugar levels.  Orders: -     CMP14+EGFR -     Lipid panel -     Hemoglobin A1c  Anxiety Assessment & Plan: Chronic. He has stopped going to Psychiatry. He did not find this beneficial.  - He agrees to [facility] SW referral for counseling.   - He is also interested in resources available to him.   Orders: -     AMB Referral [facility] Care Management  Gastroesophageal reflux disease without esophagitis Assessment & Plan: Managed with pantoprazole . Reports nocturnal acid reflux and flatulence. Potential dietary triggers discussed. - Continue pantoprazole . - Consider adding famotidine  at night for reflux symptoms. - Consider dietary modifications to reduce flatulence, such as switching to almond or coconut milk, or using lactate milk   Influenza vaccination declined  Other orders -     Levocetirizine Dihydrochloride ; Take 1 tablet (5 mg total) by mouth every evening. As needed  Dispense: 90 tablet; Refill: 2 -     Famotidine ; Take 1 tablet (20 mg total) by mouth daily. Take Prior to evening meal  Dispense: 30 tablet; Refill: 1    Return for 1 year physical, 6 month bp check. Patient was given opportunity to ask questions. Patient verbalized understanding of the plan and was able to repeat key elements of the plan. All questions were answered to their satisfaction.   I, Carlos LOISE Slocumb, MD, have reviewed all documentation for this visit. The documentation on 02/10/24 for the exam, diagnosis, procedures, and orders are all accurate and complete.      [1]  Current Outpatient Medications:    apixaban  (ELIQUIS ) 5 MG TABS tablet, Take 1 tablet by mouth twice daily, Disp: 180 tablet, Rfl: 1   Blood Glucose Monitoring Suppl (TRUE METRIX GO GLUCOSE METER) w/Device KIT, USE TO CHECK BLOOD SUGARS ONCE DAILY., Disp: 1 kit, Rfl: 1   CALCIUM  PO, Take 1 tablet by mouth daily. 600 mg daily, Disp: , Rfl:    Cholecalciferol (VITAMIN D PO), Take 1 capsule by mouth daily. , Disp: , Rfl:    famotidine  (PEPCID ) 20 MG tablet, Take 1 tablet (20 mg total) by mouth daily. Take Prior to evening meal, Disp: 30 tablet, Rfl: 1   latanoprost (XALATAN) 0.005 % ophthalmic solution, SMARTSIG:In Eye(s), Disp: , Rfl:    MAGNESIUM PO, Take by mouth.,  Disp: ,  Rfl:    metoprolol  tartrate (LOPRESSOR ) 50 MG tablet, Take 1 tablet (50 mg total) by mouth 2 (two) times daily., Disp: 180 tablet, Rfl: 2   Multiple Vitamin (MULTIVITAMIN) capsule, Take 1 capsule by mouth daily., Disp: , Rfl:    pantoprazole  (PROTONIX ) 40 MG tablet, Take 1 tablet (40 mg total) by mouth 2 (two) times daily., Disp: 180 tablet, Rfl: 2   rosuvastatin  (CRESTOR ) 10 MG tablet, Take 1 tablet (10 mg total) by mouth daily., Disp: 90 tablet, Rfl: 2   zolpidem  (AMBIEN ) 10 MG tablet, TAKE 1 TABLET BY MOUTH AT BEDTIME AS NEEDED, Disp: 60 tablet, Rfl: 0   levocetirizine (XYZAL ) 5 MG tablet, Take 1 tablet (5 mg total) by mouth every evening. As needed, Disp: 90 tablet, Rfl: 2 [2]  Allergies Allergen Reactions   Grass Pollen(K-O-R-T-Swt Vern) Cough and Other (See Comments)    Sneezing  Runny nose  Itchy eyes  [3]  Social History Tobacco Use  Smoking Status Former   Current packs/day: 0.00   Average packs/day: 0.5 packs/day for 9.0 years (4.5 ttl pk-yrs)   Types: Cigarettes   Start date: 01/22/1997   Quit date: 01/22/2006   Years since quitting: 18.0  Smokeless Tobacco Never

## 2024-02-10 NOTE — Assessment & Plan Note (Signed)
 Managed with pantoprazole . Reports nocturnal acid reflux and flatulence. Potential dietary triggers discussed. - Continue pantoprazole . - Consider adding famotidine  at night for reflux symptoms. - Consider dietary modifications to reduce flatulence, such as switching to almond or coconut milk, or using lactate milk

## 2024-02-10 NOTE — Assessment & Plan Note (Signed)
 Chronic, LDL goal is less than 70.  He will continue with rosuvastatin  daily.  - Encouraged to follow heart healthy lifestyle.

## 2024-02-10 NOTE — Assessment & Plan Note (Signed)
 Chronic. He has stopped going to Psychiatry. He did not find this beneficial.  - He agrees to [facility] SW referral for counseling.  - He is also interested in resources available to him.

## 2024-02-10 NOTE — Assessment & Plan Note (Signed)
 Previous A1c of 7.0 in 2024. No current diabetes diagnosis as only one reading above 6.5. Lifestyle modifications discussed to prevent progression to diabetes. - Continue lifestyle modifications to manage blood sugar levels.

## 2024-02-11 LAB — CBC
Hematocrit: 47.4 % (ref 37.5–51.0)
Hemoglobin: 14.8 g/dL (ref 13.0–17.7)
MCH: 28.2 pg (ref 26.6–33.0)
MCHC: 31.2 g/dL — ABNORMAL LOW (ref 31.5–35.7)
MCV: 91 fL (ref 79–97)
Platelets: 277 x10E3/uL (ref 150–450)
RBC: 5.24 x10E6/uL (ref 4.14–5.80)
RDW: 12.9 % (ref 11.6–15.4)
WBC: 6.5 x10E3/uL (ref 3.4–10.8)

## 2024-02-11 LAB — CMP14+EGFR
ALT: 13 IU/L (ref 0–44)
AST: 17 IU/L (ref 0–40)
Albumin: 4.5 g/dL (ref 3.8–4.8)
Alkaline Phosphatase: 89 IU/L (ref 47–123)
BUN/Creatinine Ratio: 19 (ref 10–24)
BUN: 18 mg/dL (ref 8–27)
Bilirubin Total: 1.1 mg/dL (ref 0.0–1.2)
CO2: 23 mmol/L (ref 20–29)
Calcium: 9.6 mg/dL (ref 8.6–10.2)
Chloride: 100 mmol/L (ref 96–106)
Creatinine, Ser: 0.97 mg/dL (ref 0.76–1.27)
Globulin, Total: 2.7 g/dL (ref 1.5–4.5)
Glucose: 112 mg/dL — ABNORMAL HIGH (ref 70–99)
Potassium: 4.7 mmol/L (ref 3.5–5.2)
Sodium: 138 mmol/L (ref 134–144)
Total Protein: 7.2 g/dL (ref 6.0–8.5)
eGFR: 80 mL/min/1.73

## 2024-02-11 LAB — LIPID PANEL
Chol/HDL Ratio: 3.1 ratio (ref 0.0–5.0)
Cholesterol, Total: 126 mg/dL (ref 100–199)
HDL: 41 mg/dL
LDL Chol Calc (NIH): 63 mg/dL (ref 0–99)
Triglycerides: 121 mg/dL (ref 0–149)
VLDL Cholesterol Cal: 22 mg/dL (ref 5–40)

## 2024-02-11 LAB — TSH: TSH: 3.39 u[IU]/mL (ref 0.450–4.500)

## 2024-02-11 LAB — HEMOGLOBIN A1C
Est. average glucose Bld gHb Est-mCnc: 137 mg/dL
Hgb A1c MFr Bld: 6.4 % — ABNORMAL HIGH (ref 4.8–5.6)

## 2024-02-11 NOTE — Progress Notes (Unsigned)
 Complex Care Management Note Care Guide Note  02/11/2024 Name: Carlos [name] MRN: [id number] DOB: 12-28-1945   Complex Care Management Outreach Attempts: A second unsuccessful outreach was attempted today to offer the patient with information about available complex care management services.  Follow Up Plan:  Additional outreach attempts will be made to offer the patient complex care management information and services.   Encounter Outcome:  No Answer  Thedford Franks, CMA, AAMA [place]  [facility], [facility] Guide, Lead Direct Dial: [phone]  Fax: [phone]

## 2024-02-13 NOTE — Progress Notes (Signed)
 Complex Care Management Note Care Guide Note  02/13/2024 Name: Carlos [name] MRN: [id number] DOB: Jun 19, 1945   Complex Care Management Outreach Attempts: A third unsuccessful outreach was attempted today to offer the patient with information about available complex care management services.  Follow Up Plan:  No further outreach attempts will be made at this time. We have been unable to contact the patient to offer or enroll patient in complex care management services.  Encounter Outcome:  No Answer  Thedford Franks, CMA, AAMA [place]  [facility], [facility] Guide, Lead Direct Dial: [phone]  Fax: [phone]

## 2024-02-13 NOTE — Progress Notes (Signed)
 Complex Care Management Note  Care Guide Note 02/13/2024 Name: Carlos [name] MRN: [id number] DOB: 1946-01-22  [name] is a 79 y.o. year old male who sees Jarold Medici, MD for primary care. I reached out to Reggie Booher by phone today to offer complex care management services.  [name] was given information about Complex Care Management services today including:   The Complex Care Management services include support from the care team which includes your Nurse Care Manager, Clinical Social Worker, or Pharmacist.  The Complex Care Management team is here to help remove barriers to the health concerns and goals most important to you. Complex Care Management services are voluntary, and the patient may decline or stop services at any time by request to their care team member.   Complex Care Management Consent Status: Patient agreed to services and verbal consent obtained.   Follow up plan:  Telephone appointment with complex care management team member scheduled for:  03/05/2024  Encounter Outcome:  Patient Scheduled  Thedford Franks, CMA, AAMA [place]  [facility], [facility] Guide, Lead Direct Dial: [phone]  Fax: [phone]

## 2024-02-17 ENCOUNTER — Ambulatory Visit

## 2024-02-17 ENCOUNTER — Encounter: Payer: Medicare PPO | Admitting: Internal Medicine

## 2024-02-17 ENCOUNTER — Institutional Professional Consult (permissible substitution): Admitting: Psychology

## 2024-02-18 ENCOUNTER — Ambulatory Visit: Payer: Self-pay | Admitting: Internal Medicine

## 2024-02-24 ENCOUNTER — Encounter: Payer: Self-pay | Admitting: Psychology

## 2024-03-05 ENCOUNTER — Telehealth: Admitting: Licensed Clinical Social Worker

## 2024-06-10 ENCOUNTER — Ambulatory Visit

## 2024-06-10 ENCOUNTER — Ambulatory Visit: Payer: Self-pay

## 2024-08-11 ENCOUNTER — Ambulatory Visit: Payer: Self-pay | Admitting: Internal Medicine

## 2025-02-15 ENCOUNTER — Encounter: Payer: Self-pay | Admitting: Internal Medicine
